# Patient Record
Sex: Male | Born: 1995 | Race: White | Hispanic: No | Marital: Single | Smoking: Current every day smoker
Health system: Southern US, Community
[De-identification: ages and names within clinical notes are randomized; demographics above are authoritative.]

## PROBLEM LIST (undated history)

## (undated) DIAGNOSIS — F1910 Other psychoactive substance abuse, uncomplicated: Secondary | ICD-10-CM

## (undated) DIAGNOSIS — Z720 Tobacco use: Secondary | ICD-10-CM

## (undated) HISTORY — PX: INGUINAL HERNIA REPAIR: SUR1180

---

## 2017-07-03 ENCOUNTER — Inpatient Hospital Stay (HOSPITAL_COMMUNITY)
Admission: EM | Admit: 2017-07-03 | Discharge: 2017-07-09 | DRG: 419 | Disposition: A | Discharge status: Home / Self Care | Payer: BLUE CROSS/BLUE SHIELD | Attending: Family Medicine | Admitting: Family Medicine

## 2017-07-03 ENCOUNTER — Encounter (HOSPITAL_COMMUNITY): Payer: Self-pay

## 2017-07-03 ENCOUNTER — Emergency Department (HOSPITAL_COMMUNITY): Payer: BLUE CROSS/BLUE SHIELD

## 2017-07-03 DIAGNOSIS — K8050 Calculus of bile duct without cholangitis or cholecystitis without obstruction: Secondary | ICD-10-CM | POA: Diagnosis not present

## 2017-07-03 DIAGNOSIS — K810 Acute cholecystitis: Secondary | ICD-10-CM | POA: Diagnosis not present

## 2017-07-03 DIAGNOSIS — Z23 Encounter for immunization: Secondary | ICD-10-CM | POA: Diagnosis not present

## 2017-07-03 DIAGNOSIS — K819 Cholecystitis, unspecified: Secondary | ICD-10-CM | POA: Diagnosis not present

## 2017-07-03 DIAGNOSIS — R1011 Right upper quadrant pain: Secondary | ICD-10-CM | POA: Diagnosis present

## 2017-07-03 DIAGNOSIS — K8511 Biliary acute pancreatitis with uninfected necrosis: Secondary | ICD-10-CM | POA: Diagnosis not present

## 2017-07-03 DIAGNOSIS — K8001 Calculus of gallbladder with acute cholecystitis with obstruction: Secondary | ICD-10-CM | POA: Diagnosis not present

## 2017-07-03 DIAGNOSIS — K838 Other specified diseases of biliary tract: Secondary | ICD-10-CM | POA: Diagnosis not present

## 2017-07-03 DIAGNOSIS — F329 Major depressive disorder, single episode, unspecified: Secondary | ICD-10-CM | POA: Diagnosis present

## 2017-07-03 DIAGNOSIS — F1910 Other psychoactive substance abuse, uncomplicated: Secondary | ICD-10-CM | POA: Diagnosis present

## 2017-07-03 DIAGNOSIS — F1010 Alcohol abuse, uncomplicated: Secondary | ICD-10-CM | POA: Diagnosis present

## 2017-07-03 DIAGNOSIS — R17 Unspecified jaundice: Secondary | ICD-10-CM | POA: Diagnosis not present

## 2017-07-03 DIAGNOSIS — K851 Biliary acute pancreatitis without necrosis or infection: Secondary | ICD-10-CM | POA: Diagnosis not present

## 2017-07-03 DIAGNOSIS — F17200 Nicotine dependence, unspecified, uncomplicated: Secondary | ICD-10-CM | POA: Diagnosis present

## 2017-07-03 DIAGNOSIS — F1510 Other stimulant abuse, uncomplicated: Secondary | ICD-10-CM | POA: Diagnosis present

## 2017-07-03 DIAGNOSIS — K8309 Other cholangitis: Secondary | ICD-10-CM | POA: Diagnosis not present

## 2017-07-03 DIAGNOSIS — K8067 Calculus of gallbladder and bile duct with acute and chronic cholecystitis with obstruction: Secondary | ICD-10-CM | POA: Diagnosis present

## 2017-07-03 DIAGNOSIS — Z79899 Other long term (current) drug therapy: Secondary | ICD-10-CM

## 2017-07-03 DIAGNOSIS — Z720 Tobacco use: Secondary | ICD-10-CM | POA: Diagnosis present

## 2017-07-03 DIAGNOSIS — K8020 Calculus of gallbladder without cholecystitis without obstruction: Secondary | ICD-10-CM | POA: Diagnosis present

## 2017-07-03 DIAGNOSIS — R945 Abnormal results of liver function studies: Secondary | ICD-10-CM | POA: Diagnosis present

## 2017-07-03 DIAGNOSIS — K7689 Other specified diseases of liver: Secondary | ICD-10-CM | POA: Diagnosis not present

## 2017-07-03 DIAGNOSIS — K8043 Calculus of bile duct with acute cholecystitis with obstruction: Secondary | ICD-10-CM | POA: Diagnosis not present

## 2017-07-03 DIAGNOSIS — F32A Depression, unspecified: Secondary | ICD-10-CM | POA: Diagnosis present

## 2017-07-03 DIAGNOSIS — Z419 Encounter for procedure for purposes other than remedying health state, unspecified: Secondary | ICD-10-CM

## 2017-07-03 HISTORY — DX: Tobacco use: Z72.0

## 2017-07-03 HISTORY — DX: Other psychoactive substance abuse, uncomplicated: F19.10

## 2017-07-03 LAB — CBC
HEMATOCRIT: 43.1 % (ref 39.0–52.0)
HEMOGLOBIN: 14 g/dL (ref 13.0–17.0)
MCH: 25.8 pg — ABNORMAL LOW (ref 26.0–34.0)
MCHC: 32.5 g/dL (ref 30.0–36.0)
MCV: 79.5 fL (ref 78.0–100.0)
Platelets: 284 10*3/uL (ref 150–400)
RBC: 5.42 MIL/uL (ref 4.22–5.81)
RDW: 15.5 % (ref 11.5–15.5)
WBC: 6.6 10*3/uL (ref 4.0–10.5)

## 2017-07-03 LAB — COMPREHENSIVE METABOLIC PANEL
ALT: 288 U/L — ABNORMAL HIGH (ref 17–63)
AST: 158 U/L — AB (ref 15–41)
Albumin: 3.8 g/dL (ref 3.5–5.0)
Alkaline Phosphatase: 277 U/L — ABNORMAL HIGH (ref 38–126)
Anion gap: 10 (ref 5–15)
BUN: 7 mg/dL (ref 6–20)
CHLORIDE: 104 mmol/L (ref 101–111)
CO2: 25 mmol/L (ref 22–32)
Calcium: 9.6 mg/dL (ref 8.9–10.3)
Creatinine, Ser: 0.91 mg/dL (ref 0.61–1.24)
Glucose, Bld: 114 mg/dL — ABNORMAL HIGH (ref 65–99)
POTASSIUM: 4.3 mmol/L (ref 3.5–5.1)
Sodium: 139 mmol/L (ref 135–145)
Total Bilirubin: 5.8 mg/dL — ABNORMAL HIGH (ref 0.3–1.2)
Total Protein: 7.6 g/dL (ref 6.5–8.1)

## 2017-07-03 LAB — URINALYSIS, ROUTINE W REFLEX MICROSCOPIC
GLUCOSE, UA: NEGATIVE mg/dL
Hgb urine dipstick: NEGATIVE
Ketones, ur: 5 mg/dL — AB
LEUKOCYTES UA: NEGATIVE
Nitrite: NEGATIVE
PH: 5 (ref 5.0–8.0)
PROTEIN: NEGATIVE mg/dL
Specific Gravity, Urine: 1.025 (ref 1.005–1.030)

## 2017-07-03 LAB — PROTIME-INR
INR: 1
Prothrombin Time: 13.1 seconds (ref 11.4–15.2)

## 2017-07-03 LAB — AMMONIA: AMMONIA: 42 umol/L — AB (ref 9–35)

## 2017-07-03 LAB — LIPASE, BLOOD: LIPASE: 279 U/L — AB (ref 11–51)

## 2017-07-03 MED ORDER — PIPERACILLIN-TAZOBACTAM 3.375 G IVPB 30 MIN
3.3750 g | Freq: Once | INTRAVENOUS | Status: AC
  Administered 2017-07-03: 3.375 g via INTRAVENOUS
  Filled 2017-07-03: qty 50

## 2017-07-03 MED ORDER — IBUPROFEN 400 MG PO TABS: 600.0000 mg | ORAL_TABLET | Freq: Four times a day (QID) | ORAL | Status: DC | PRN

## 2017-07-03 MED ORDER — ONDANSETRON HCL 4 MG PO TABS: 4.0000 mg | ORAL_TABLET | Freq: Four times a day (QID) | ORAL | Status: DC | PRN

## 2017-07-03 MED ORDER — LOPERAMIDE HCL 2 MG PO CAPS: 2.0000 mg | ORAL_CAPSULE | Freq: Four times a day (QID) | ORAL | Status: DC | PRN

## 2017-07-03 MED ORDER — SODIUM CHLORIDE 0.9 % IV SOLN
Freq: Once | INTRAVENOUS | Status: AC
  Administered 2017-07-03: 18:00:00 via INTRAVENOUS

## 2017-07-03 MED ORDER — DULOXETINE HCL 30 MG PO CPEP
30.0000 mg | ORAL_CAPSULE | Freq: Every day | ORAL | Status: DC
  Administered 2017-07-06 – 2017-07-09 (×3): 30 mg via ORAL
  Filled 2017-07-03 (×6): qty 1

## 2017-07-03 MED ORDER — GUAIFENESIN ER 600 MG PO TB12: 600.0000 mg | ORAL_TABLET | Freq: Two times a day (BID) | ORAL | Status: DC | PRN

## 2017-07-03 MED ORDER — SODIUM CHLORIDE 0.9 % IV SOLN
INTRAVENOUS | Status: DC
  Administered 2017-07-03 – 2017-07-04 (×2): via INTRAVENOUS
  Administered 2017-07-05: 125 mL/h via INTRAVENOUS
  Administered 2017-07-05 – 2017-07-08 (×5): via INTRAVENOUS

## 2017-07-03 MED ORDER — SODIUM CHLORIDE 0.9 % IV BOLUS (SEPSIS)
500.0000 mL | Freq: Once | INTRAVENOUS | Status: AC
  Administered 2017-07-03: 500 mL via INTRAVENOUS

## 2017-07-03 MED ORDER — METHOCARBAMOL 500 MG PO TABS
1000.0000 mg | ORAL_TABLET | Freq: Three times a day (TID) | ORAL | Status: DC | PRN
  Administered 2017-07-04 – 2017-07-08 (×11): 1000 mg via ORAL
  Filled 2017-07-03 (×12): qty 2

## 2017-07-03 MED ORDER — KETOROLAC TROMETHAMINE 15 MG/ML IJ SOLN
15.0000 mg | Freq: Three times a day (TID) | INTRAMUSCULAR | Status: DC
  Administered 2017-07-04: 15 mg via INTRAVENOUS
  Filled 2017-07-03: qty 1

## 2017-07-03 MED ORDER — ONDANSETRON HCL 4 MG/2ML IJ SOLN
4.0000 mg | Freq: Four times a day (QID) | INTRAMUSCULAR | Status: DC | PRN
Start: 2017-07-03 — End: 2017-07-09

## 2017-07-03 MED ORDER — IBUPROFEN 400 MG PO TABS
400.0000 mg | ORAL_TABLET | Freq: Four times a day (QID) | ORAL | Status: DC | PRN
Start: 2017-07-03 — End: 2017-07-04

## 2017-07-03 MED ORDER — NICOTINE 21 MG/24HR TD PT24
21.0000 mg | MEDICATED_PATCH | Freq: Every day | TRANSDERMAL | Status: DC
  Administered 2017-07-04 – 2017-07-09 (×6): 21 mg via TRANSDERMAL
  Filled 2017-07-03 (×7): qty 1

## 2017-07-03 MED ORDER — MELATONIN 3 MG PO TABS
3.0000 mg | ORAL_TABLET | Freq: Every evening | ORAL | Status: DC | PRN
  Administered 2017-07-04 – 2017-07-07 (×3): 6 mg via ORAL
  Administered 2017-07-08: 3 mg via ORAL
  Filled 2017-07-03 (×9): qty 2

## 2017-07-03 MED ORDER — LORATADINE 10 MG PO TABS
10.0000 mg | ORAL_TABLET | Freq: Every day | ORAL | Status: DC
  Administered 2017-07-04 – 2017-07-09 (×4): 10 mg via ORAL
  Filled 2017-07-03 (×4): qty 1

## 2017-07-03 MED ORDER — ADULT MULTIVITAMIN W/MINERALS CH
1.0000 | ORAL_TABLET | Freq: Every day | ORAL | Status: DC
Start: 2017-07-04 — End: 2017-07-09
  Administered 2017-07-04 – 2017-07-09 (×4): 1 via ORAL
  Filled 2017-07-03 (×4): qty 1

## 2017-07-03 NOTE — ED Notes (Signed)
ED Provider at bedside. 

## 2017-07-03 NOTE — ED Provider Notes (Signed)
[facility] EMERGENCY DEPARTMENT Provider Note   CSN: [id number] Arrival date & time: 07/03/17  1415     History   Chief Complaint Chief Complaint  Patient presents with  . Abdominal Pain    HPI Nasiah Lehenbauer is a 22 y.o. male.  The history is provided by the patient and medical records. No language interpreter was used.  Abdominal Pain   Associated symptoms include diarrhea. Pertinent negatives include nausea, vomiting and constipation.   Zaidan Keeble is a 22 y.o. male who presents to the Emergency Department complaining of right upper quadrant abdominal pain x 3 days.  Pain is worsening.  It is worse with certain movements.  Associated symptoms include right-sided back pain.  This morning, he was told that he had a yellow coloring and was advised to come to the emergency department.  Patient is from [place] [facility] where he is undergoing alcohol and drug treatment.  He has been there for about 16 days.  He reports drinking alcohol until he blacks out nearly daily, mostly beer.  He also endorses marijuana and meth use, but denies any type of IV drug use.  Denies any fever, chills, lightheadedness, nausea, vomiting, constipation or blood in the stool.  He did have a few loose stools yesterday and today which were a greenish color.  He denies any history of similar.  Past Medical History:  Diagnosis Date  . Drug abuse ([place])   . Tobacco abuse     Patient Active Problem List   Diagnosis Date Noted  . Acute cholecystitis 07/03/2017  . Gallstone 07/03/2017  . Right upper quadrant abdominal pain 07/03/2017  . Tobacco abuse   . Drug abuse ([place])     History reviewed. No pertinent surgical history.     Home Medications    Prior to Admission medications   Medication Sig Start Date End Date Taking? Authorizing Provider  DULoxetine (CYMBALTA) 30 MG capsule Take 30 mg by mouth daily.   Yes [provider]  Melatonin 5 MG TABS Take 5-10 mg by mouth at  bedtime as needed (for insomnia).   Yes [provider]  methocarbamol (ROBAXIN) 500 MG tablet Take 1,000 mg by mouth every 8 (eight) hours as needed for muscle spasms.   Yes [provider]  Multiple Vitamin (MULTIVITAMIN WITH MINERALS) TABS tablet Take 1 tablet by mouth daily.   Yes [provider]    Family History No family history on file.  Social History Social History   Tobacco Use  . Smoking status: Current Every Day Smoker  . Smokeless tobacco: Never Used  Substance Use Topics  . Alcohol use: Yes  . Drug use: Yes    Types: Marijuana, Methamphetamines     Allergies   Mushroom extract complex   Review of Systems Review of Systems  Constitutional: Positive for appetite change (Decreased).  Gastrointestinal: Positive for abdominal pain and diarrhea. Negative for blood in stool, constipation, nausea and vomiting.  Musculoskeletal: Positive for back pain.     Physical Exam Updated Vital Signs BP 126/78   Pulse (!) 57   Temp 98.7 F (37.1 C) (Oral)   Resp (!) 22   SpO2 97%   Physical Exam  Constitutional: He is oriented to person, place, and time. He appears well-developed and well-nourished. No distress.  HENT:  Head: Normocephalic and atraumatic.  Eyes: Scleral icterus is present.  Cardiovascular: Normal rate, regular rhythm and normal heart sounds.  No murmur heard. Pulmonary/Chest: Effort normal and breath sounds  normal. No respiratory distress.  Abdominal: Soft. He exhibits no distension. There is tenderness in the right upper quadrant.  Musculoskeletal: He exhibits no edema.  Neurological: He is alert and oriented to person, place, and time.  Skin: Skin is warm and dry.  Nursing note and vitals reviewed.    ED Treatments / Results  Labs (all labs ordered are listed, but only abnormal results are displayed) Labs Reviewed  LIPASE, BLOOD - Abnormal; Notable for the following components:      Result Value   Lipase 279 (*)     All other components within normal limits  COMPREHENSIVE METABOLIC PANEL - Abnormal; Notable for the following components:   Glucose, Bld 114 (*)    AST 158 (*)    ALT 288 (*)    Alkaline Phosphatase 277 (*)    Total Bilirubin 5.8 (*)    All other components within normal limits  CBC - Abnormal; Notable for the following components:   MCH 25.8 (*)    All other components within normal limits  PROTIME-INR  URINALYSIS, ROUTINE W REFLEX MICROSCOPIC  HEPATITIS PANEL, ACUTE  AMMONIA    EKG  EKG Interpretation None       Radiology US Abdomen Limited Ruq  Result Date: 07/03/2017 CLINICAL DATA:  22 year old male with history of right upper quadrant abdominal pain and jaundice for the past 2 weeks. EXAM: ULTRASOUND ABDOMEN LIMITED RIGHT UPPER QUADRANT COMPARISON:  None. FINDINGS: Gallbladder: Large amount of biliary sludge and multiple small gallstones filling the lumen of the gallbladder. Gallbladder is moderately distended. Gallbladder wall is thickened measuring 7.4 mm. No definite pericholecystic fluid. However, per report, sonographic Murphy sign was positive. Common bile duct: Diameter: 10.4 mm in the porta hepatis. Low level echogenicity (without shadowing) in the common bile duct could indicate the presence of intraductal sludge. Liver: No focal lesion identified. There appears to be some mild intrahepatic biliary ductal dilatation. Within normal limits in parenchymal echogenicity. Portal vein is patent on color Doppler imaging with normal direction of blood flow towards the liver. IMPRESSION: 1. Biliary sludge and cholelithiasis with evidence suggestive of acute cholecystitis, as well as common bile duct dilatation and mild intrahepatic biliary ductal dilatation, concerning for ductal obstruction (potentially related to intraductal sludge). Surgical consultation is recommended. Electronically Signed   By: Vinnie Langton M.D.   On: 07/03/2017 21:11    Procedures Procedures  (including critical care time)  Medications Ordered in ED Medications  piperacillin-tazobactam (ZOSYN) IVPB 3.375 g (3.375 g Intravenous New Bag/Given 07/03/17 2158)  sodium chloride 0.9 % bolus 500 mL (0 mLs Intravenous Stopped 07/03/17 1912)    Followed by  0.9 %  sodium chloride infusion ( Intravenous New Bag/Given 07/03/17 1752)     Initial Impression / Assessment and Plan / ED Course  I have reviewed the triage vital signs and the nursing notes.  Pertinent labs & imaging results that were available during my care of the patient were reviewed by me and considered in my medical decision making (see chart for details).    Tywone Bembenek is a 22 y.o. male who presents to ED for RUQ abdominal pain x 3-4 days. On exam, patient is afebrile, hemodynamically stable with tenderness to RUQ. Jaundiced on exam. Labs reviewed with lipase of 279, bili 5.8, AST/ALT 158/288 and alk phos of 277. Hepatitis panel obtained. PT/INR wdl. RUQ obtained showing biliary sludge and cholelithiasis as well as dilation of common bile duct and intrahepatic bile duct.   9:33 PM - Discussed case  with general surgery, Dr. Donne Hazel, who recommends hospitalist admission, GI consultation and general surgery will follow.   [facility] consulted who will see patient in the am. Continue IV hydration. Likely ERCP in the next day or two.   Hospitalist consulted who will admit.   Patient discussed with Dr. Jeneen Rinks who agrees with treatment plan.    Final Clinical Impressions(s) / ED Diagnoses   Final diagnoses:  RUQ pain    ED Discharge Orders    None       Ormond Lazo, Ozella Almond, PA-C 07/03/17 2225    Tanna Furry, MD 07/03/17 2250

## 2017-07-03 NOTE — ED Notes (Signed)
Patient transported to Ultrasound 

## 2017-07-03 NOTE — ED Notes (Signed)
Daneil DolinCaroline Bushee Mother ok to come back when arrives to ED.

## 2017-07-03 NOTE — ED Notes (Signed)
Urine sample requested.

## 2017-07-03 NOTE — H&P (Signed)
History and Physical    Alex Potter ZOX:[id number] DOB: 1996/04/12 DOA: 07/03/2017  Referring MD/NP/PA:   PCP: Patient, No Pcp Per   Patient coming from:  The patient is coming from home.  At baseline, pt is independent for most of ADL.   Chief Complaint: Right upper quadrant abdominal pain and jaundice  HPI: Alex Potter is a 22 y.o. male with medical history significant of drug abuse, tobacco abuse, alcohol abuse, depression, who presents with right upper quadrant abdominal pain and jaundice.  Patient states that he has been having right upper quadrant abdominal pain the past 3 days. He is constant, 6 out of 10 in severity, radiating to the right lower back, sharp. No fever or chills. Patient denies nausea, vomiting, diarrhea or abdominal pain. Patient was noted to have yellow skin and eyes. Patient does not have chest pain, shortness breath, cough. Of note, patient is in [facility] where he is undergoing alcohol and drug treatment.  He has been there for about 16 days.  He reports drinking alcohol nearly daily, mostly beer.  He also endorses marijuana and meth use, but denies any type of IV drug use.   ED Course: pt was found to have WBC 6.6, INR 1.0, lipase 279, electrolytes renal function okay, abnormal liver functions with ALP 277, AST 158, ALT 288, total bilirubin 5.8. Temperature normal, bradycardia, oxygen saturation 97% on room air. Patient is admitted to MedSurg bed as inpatient. Gen. surgeon, Dr. Dwain Sarna and [facility] were consulted.  # US-RUQ showed: biliary sludge and cholelithiasis with evidence suggestive of acute cholecystitis, as well as common bile duct dilatation and mild intrahepatic biliary ductal dilatation, concerning for ductal obstruction (potentially related to intraductal sludge).   Review of Systems:   General: no fevers, chills, no body weight gain, has poor appetite, has fatigue HEENT: no blurry vision, hearing changes or sore throat Respiratory: no  dyspnea, coughing, wheezing CV: no chest pain, no palpitations GI: no nausea, vomiting, has abdominal pain, no diarrhea, constipation GU: no dysuria, burning on urination, increased urinary frequency, hematuria  Ext: no leg edema Neuro: no unilateral weakness, numbness, or tingling, no vision change or hearing loss Skin: no rash, no skin tear. MSK: No muscle spasm, no deformity, no limitation of range of movement in spin Heme: No easy bruising.  Travel history: No recent long distant travel.  Allergy:  Allergies  Allergen Reactions  . Mushroom Extract Complex Itching and Swelling    Past Medical History:  Diagnosis Date  . Drug abuse ([facility])   . Tobacco abuse     Past Surgical History:  Procedure Laterality Date  . INGUINAL HERNIA REPAIR Right     Social History:  reports that he has been smoking.  he has never used smokeless tobacco. He reports that he drinks alcohol. He reports that he uses drugs. Drugs: Marijuana and Methamphetamines.  Family History: Patient is adopted.   Prior to Admission medications   Medication Sig Start Date End Date Taking? Authorizing Provider  Methocarbamol (ROBAXIN PO) Take 2 tablets by mouth 3 (three) times daily.   Yes [provider]    Physical Exam: Vitals:   07/03/17 2315 07/03/17 2330 07/03/17 2345 07/04/17 0000  BP: 102/88 104/69 (!) 106/53 119/83  Pulse: 73 62 (!) 101 91  Resp: (!) 23 (!) 21 19 (!) 24  Temp:      TempSrc:      SpO2: 97% 97% 99% 96%   General: Not in acute distress HEENT:  Eyes: PERRL, EOMI, has scleral icterus.       ENT: No discharge from the ears and nose, no pharynx injection, no tonsillar enlargement.        Neck: No JVD, no bruit, no mass felt. Heme: No neck lymph node enlargement. Cardiac: S1/S2, RRR, No murmurs, No gallops or rubs. Respiratory: No rales, wheezing, rhonchi or rubs. GI: Soft, nondistended, has tenderness in RUQ, no rebound pain, no organomegaly, BS present. GU: No  hematuria Ext: No pitting leg edema bilaterally. 2+DP/PT pulse bilaterally. Musculoskeletal: No joint deformities, No joint redness or warmth, no limitation of ROM in spin. Skin: No rashes.  Neuro: Alert, oriented X3, cranial nerves II-XII grossly intact, moves all extremities normally.  Psych: Patient is not psychotic, no suicidal or hemocidal ideation.  Labs on Admission: I have personally reviewed following labs and imaging studies  CBC: Recent Labs  Lab 07/03/17 1500  WBC 6.6  HGB 14.0  HCT 43.1  MCV 79.5  PLT 284   Basic Metabolic Panel: Recent Labs  Lab 07/03/17 1500  NA 139  K 4.3  CL 104  CO2 25  GLUCOSE 114*  BUN 7  CREATININE 0.91  CALCIUM 9.6   GFR: CrCl cannot be calculated (Unknown ideal weight.). Liver Function Tests: Recent Labs  Lab 07/03/17 1500  AST 158*  ALT 288*  ALKPHOS 277*  BILITOT 5.8*  PROT 7.6  ALBUMIN 3.8   Recent Labs  Lab 07/03/17 1500  LIPASE 279*   Recent Labs  Lab 07/03/17 2149  AMMONIA 42*   Coagulation Profile: Recent Labs  Lab 07/03/17 1447  INR 1.00   Cardiac Enzymes: No results for input(s): CKTOTAL, CKMB, CKMBINDEX, TROPONINI in the last 168 hours. BNP (last 3 results) No results for input(s): PROBNP in the last 8760 hours. HbA1C: No results for input(s): HGBA1C in the last 72 hours. CBG: No results for input(s): GLUCAP in the last 168 hours. Lipid Profile: No results for input(s): CHOL, HDL, LDLCALC, TRIG, CHOLHDL, LDLDIRECT in the last 72 hours. Thyroid Function Tests: No results for input(s): TSH, T4TOTAL, FREET4, T3FREE, THYROIDAB in the last 72 hours. Anemia Panel: No results for input(s): VITAMINB12, FOLATE, FERRITIN, TIBC, IRON, RETICCTPCT in the last 72 hours. Urine analysis:    Component Value Date/Time   COLORURINE AMBER (A) 07/03/2017 2119   APPEARANCEUR CLEAR 07/03/2017 2119   LABSPEC 1.025 07/03/2017 2119   PHURINE 5.0 07/03/2017 2119   GLUCOSEU NEGATIVE 07/03/2017 2119   HGBUR  NEGATIVE 07/03/2017 2119   BILIRUBINUR MODERATE (A) 07/03/2017 2119   KETONESUR 5 (A) 07/03/2017 2119   PROTEINUR NEGATIVE 07/03/2017 2119   NITRITE NEGATIVE 07/03/2017 2119   LEUKOCYTESUR NEGATIVE 07/03/2017 2119   Sepsis Labs: @LABRCNTIP (procalcitonin:4,lacticidven:4) )No results found for this or any previous visit (from the past 240 hour(s)).   Radiological Exams on Admission: US Abdomen Limited Ruq  Result Date: 07/03/2017 CLINICAL DATA:  22 year old male with history of right upper quadrant abdominal pain and jaundice for the past 2 weeks. EXAM: ULTRASOUND ABDOMEN LIMITED RIGHT UPPER QUADRANT COMPARISON:  None. FINDINGS: Gallbladder: Large amount of biliary sludge and multiple small gallstones filling the lumen of the gallbladder. Gallbladder is moderately distended. Gallbladder wall is thickened measuring 7.4 mm. No definite pericholecystic fluid. However, per report, sonographic Murphy sign was positive. Common bile duct: Diameter: 10.4 mm in the porta hepatis. Low level echogenicity (without shadowing) in the common bile duct could indicate the presence of intraductal sludge. Liver: No focal lesion identified. There appears to be some mild intrahepatic  biliary ductal dilatation. Within normal limits in parenchymal echogenicity. Portal vein is patent on color Doppler imaging with normal direction of blood flow towards the liver. IMPRESSION: 1. Biliary sludge and cholelithiasis with evidence suggestive of acute cholecystitis, as well as common bile duct dilatation and mild intrahepatic biliary ductal dilatation, concerning for ductal obstruction (potentially related to intraductal sludge). Surgical consultation is recommended. Electronically Signed   By: Trudie Reedaniel  Entrikin M.D.   On: 07/03/2017 21:11     EKG:  Not done in ED.  Assessment/Plan Principal Problem:   Right upper quadrant abdominal pain Active Problems:   Acute cholecystitis   Gallstone   Tobacco abuse   Drug abuse ([facility])    Cholecystitis   Abnormal liver function   Depression   Right upper quadrant abdominal pain due to acute cholecystitis and cholecystitis: US-RUQ showed biliary sludge and cholelithiasis with evidence suggestive of acute cholecystitis, as well as common bile duct dilatation and mild intrahepatic biliary ductal dilatation. Patient is not septic, hemodynamically stable. No fever or leukocytosis. Gen. surgeon, Dr. Dwain SarnaWakefield, and [facility] (EDP did not write down GI doctor's name).  -will admit to MedSurg bed as inpatient -When necessary Toradol for moderate pain -Continue home ibuprofen for mild pain -IVF: 1.5 L normal saline, followed by 125 mL per hour -IV zosyn and blood culture  Polysubstance abuse, including tobacco, alcohol and meth use: [facility].  He has been there for about 16 days.  -Did counseling about importance of quitting smoking and drinking -Nicotine -check UDS  Abnormal liver function common: Most likely due to gallstone and tobacco abuse -Check hepatitis panel and HIV antibody -avoid using liver toxic medications, such as Tylenol  Depression: Stable, no suicidal or homicidal ideations. -Continue home medications: Cymbalta   DVT ppx: SCD Code Status: Full code Family Communication:  Yes, patient's mother  at bed side Disposition Plan:  Anticipate discharge back to previous home environment Consults called:  Gen. surgeon, Dr. Dwain SarnaWakefield, and [facility] Admission status:  medical floor/inpt Date of Service 07/04/2017    Lorretta HarpXilin Starlynn Klinkner [facility] Pager [phone]  If 7PM-7AM, please contact night-coverage [email or web address] Password [email or web address] 07/04/2017, 1:09 AM

## 2017-07-03 NOTE — ED Triage Notes (Signed)
[facility] States that he has had 3 days of RUQ pain that is worse with movement and decreased appetite. No nausea, no vomiting. Pale with yellowish tent to skin. alert and oriented

## 2017-07-04 ENCOUNTER — Inpatient Hospital Stay (HOSPITAL_COMMUNITY): Payer: BLUE CROSS/BLUE SHIELD | Admitting: Anesthesiology

## 2017-07-04 ENCOUNTER — Other Ambulatory Visit: Payer: Self-pay

## 2017-07-04 ENCOUNTER — Encounter (HOSPITAL_COMMUNITY): Payer: Self-pay | Admitting: Internal Medicine

## 2017-07-04 ENCOUNTER — Inpatient Hospital Stay (HOSPITAL_COMMUNITY): Payer: BLUE CROSS/BLUE SHIELD

## 2017-07-04 ENCOUNTER — Encounter (HOSPITAL_COMMUNITY)
Admission: EM | Disposition: A | Discharge status: Home / Self Care | Payer: Self-pay | Source: Home / Self Care | Attending: Family Medicine

## 2017-07-04 DIAGNOSIS — R945 Abnormal results of liver function studies: Secondary | ICD-10-CM | POA: Diagnosis present

## 2017-07-04 DIAGNOSIS — K8050 Calculus of bile duct without cholangitis or cholecystitis without obstruction: Secondary | ICD-10-CM

## 2017-07-04 DIAGNOSIS — F32A Depression, unspecified: Secondary | ICD-10-CM | POA: Diagnosis present

## 2017-07-04 DIAGNOSIS — K851 Biliary acute pancreatitis without necrosis or infection: Secondary | ICD-10-CM

## 2017-07-04 DIAGNOSIS — R1011 Right upper quadrant pain: Secondary | ICD-10-CM

## 2017-07-04 DIAGNOSIS — R17 Unspecified jaundice: Secondary | ICD-10-CM

## 2017-07-04 DIAGNOSIS — F329 Major depressive disorder, single episode, unspecified: Secondary | ICD-10-CM | POA: Diagnosis present

## 2017-07-04 DIAGNOSIS — K838 Other specified diseases of biliary tract: Secondary | ICD-10-CM

## 2017-07-04 HISTORY — PX: ERCP: SHX5425

## 2017-07-04 LAB — COMPREHENSIVE METABOLIC PANEL
ALK PHOS: 260 U/L — AB (ref 38–126)
ALT: 239 U/L — ABNORMAL HIGH (ref 17–63)
ANION GAP: 10 (ref 5–15)
AST: 133 U/L — ABNORMAL HIGH (ref 15–41)
Albumin: 3.1 g/dL — ABNORMAL LOW (ref 3.5–5.0)
BUN: 7 mg/dL (ref 6–20)
CALCIUM: 8.8 mg/dL — AB (ref 8.9–10.3)
CHLORIDE: 106 mmol/L (ref 101–111)
CO2: 22 mmol/L (ref 22–32)
CREATININE: 1 mg/dL (ref 0.61–1.24)
Glucose, Bld: 91 mg/dL (ref 65–99)
Potassium: 3.8 mmol/L (ref 3.5–5.1)
SODIUM: 138 mmol/L (ref 135–145)
Total Bilirubin: 5.2 mg/dL — ABNORMAL HIGH (ref 0.3–1.2)
Total Protein: 6.2 g/dL — ABNORMAL LOW (ref 6.5–8.1)

## 2017-07-04 LAB — RAPID URINE DRUG SCREEN, HOSP PERFORMED
Amphetamines: NOT DETECTED
BARBITURATES: NOT DETECTED
BENZODIAZEPINES: NOT DETECTED
Cocaine: NOT DETECTED
Opiates: NOT DETECTED
TETRAHYDROCANNABINOL: POSITIVE — AB

## 2017-07-04 LAB — TYPE AND SCREEN
ABO/RH(D): O POS
Antibody Screen: NEGATIVE

## 2017-07-04 LAB — HEPATITIS PANEL, ACUTE
HEP A IGM: NEGATIVE
Hep B C IgM: NEGATIVE
Hepatitis B Surface Ag: NEGATIVE

## 2017-07-04 LAB — HIV ANTIBODY (ROUTINE TESTING W REFLEX): HIV Screen 4th Generation wRfx: NONREACTIVE

## 2017-07-04 LAB — MRSA PCR SCREENING: MRSA by PCR: NEGATIVE

## 2017-07-04 LAB — LIPASE, BLOOD: Lipase: 163 U/L — ABNORMAL HIGH (ref 11–51)

## 2017-07-04 LAB — GLUCOSE, CAPILLARY: Glucose-Capillary: 76 mg/dL (ref 65–99)

## 2017-07-04 LAB — APTT: APTT: 33 s (ref 24–36)

## 2017-07-04 LAB — ABO/RH: ABO/RH(D): O POS

## 2017-07-04 SURGERY — ERCP, WITH INTERVENTION IF INDICATED
Anesthesia: General

## 2017-07-04 MED ORDER — PNEUMOCOCCAL VAC POLYVALENT 25 MCG/0.5ML IJ INJ
0.5000 mL | INJECTION | INTRAMUSCULAR | Status: AC
  Administered 2017-07-06: 0.5 mL via INTRAMUSCULAR
  Filled 2017-07-04: qty 0.5

## 2017-07-04 MED ORDER — IOPAMIDOL (ISOVUE-300) INJECTION 61%
INTRAVENOUS | Status: AC
  Filled 2017-07-04: qty 100

## 2017-07-04 MED ORDER — IBUPROFEN 400 MG PO TABS
400.0000 mg | ORAL_TABLET | Freq: Four times a day (QID) | ORAL | Status: DC | PRN
  Administered 2017-07-04 (×2): 400 mg via ORAL
  Filled 2017-07-04: qty 2
  Filled 2017-07-04: qty 1

## 2017-07-04 MED ORDER — GLUCAGON HCL RDNA (DIAGNOSTIC) 1 MG IJ SOLR
INTRAMUSCULAR | Status: DC | PRN
  Administered 2017-07-04 (×2): .5 mg via INTRAVENOUS

## 2017-07-04 MED ORDER — INFLUENZA VAC SPLIT QUAD 0.5 ML IM SUSY
0.5000 mL | PREFILLED_SYRINGE | INTRAMUSCULAR | Status: AC
  Administered 2017-07-06: 0.5 mL via INTRAMUSCULAR
  Filled 2017-07-04: qty 0.5

## 2017-07-04 MED ORDER — DEXAMETHASONE SODIUM PHOSPHATE 10 MG/ML IJ SOLN
INTRAMUSCULAR | Status: DC | PRN
  Administered 2017-07-04: 10 mg via INTRAVENOUS

## 2017-07-04 MED ORDER — ONDANSETRON HCL 4 MG/2ML IJ SOLN
INTRAMUSCULAR | Status: DC | PRN
  Administered 2017-07-04: 4 mg via INTRAVENOUS

## 2017-07-04 MED ORDER — ROCURONIUM BROMIDE 100 MG/10ML IV SOLN
INTRAVENOUS | Status: DC | PRN
  Administered 2017-07-04: 50 mg via INTRAVENOUS

## 2017-07-04 MED ORDER — GLYCOPYRROLATE 0.2 MG/ML IJ SOLN
INTRAMUSCULAR | Status: DC | PRN
  Administered 2017-07-04: 0.3 mg via INTRAVENOUS

## 2017-07-04 MED ORDER — PROPOFOL 10 MG/ML IV BOLUS
INTRAVENOUS | Status: DC | PRN
  Administered 2017-07-04: 200 mg via INTRAVENOUS

## 2017-07-04 MED ORDER — CHLORHEXIDINE GLUCONATE CLOTH 2 % EX PADS: 6.0000 | MEDICATED_PAD | Freq: Once | CUTANEOUS | Status: DC

## 2017-07-04 MED ORDER — INDOMETHACIN 50 MG RE SUPP
RECTAL | Status: AC
  Filled 2017-07-04: qty 2

## 2017-07-04 MED ORDER — PIPERACILLIN-TAZOBACTAM 3.375 G IVPB
3.3750 g | Freq: Three times a day (TID) | INTRAVENOUS | Status: DC
  Administered 2017-07-04 – 2017-07-09 (×16): 3.375 g via INTRAVENOUS
  Filled 2017-07-04 (×17): qty 50

## 2017-07-04 MED ORDER — INDOMETHACIN 50 MG RE SUPP
RECTAL | Status: DC | PRN
  Administered 2017-07-04: 100 mg via RECTAL

## 2017-07-04 MED ORDER — GLUCAGON HCL RDNA (DIAGNOSTIC) 1 MG IJ SOLR
INTRAMUSCULAR | Status: AC
  Filled 2017-07-04: qty 1

## 2017-07-04 MED ORDER — NEOSTIGMINE METHYLSULFATE 10 MG/10ML IV SOLN
INTRAVENOUS | Status: DC | PRN
  Administered 2017-07-04: 3 mg via INTRAVENOUS

## 2017-07-04 MED ORDER — LACTATED RINGERS IV SOLN
INTRAVENOUS | Status: DC | PRN
  Administered 2017-07-04: 13:00:00 via INTRAVENOUS

## 2017-07-04 MED ORDER — KETOROLAC TROMETHAMINE 15 MG/ML IJ SOLN
15.0000 mg | Freq: Three times a day (TID) | INTRAMUSCULAR | Status: AC | PRN
  Administered 2017-07-06 – 2017-07-07 (×2): 15 mg via INTRAVENOUS
  Filled 2017-07-04 (×3): qty 1

## 2017-07-04 MED ORDER — IOPAMIDOL (ISOVUE-370) INJECTION 76%
INTRAVENOUS | Status: DC | PRN
  Administered 2017-07-04: 50 mL via INTRAVENOUS

## 2017-07-04 MED ORDER — LIDOCAINE HCL (CARDIAC) 20 MG/ML IV SOLN
INTRAVENOUS | Status: DC | PRN
  Administered 2017-07-04: 100 mg via INTRAVENOUS

## 2017-07-04 NOTE — Interval H&P Note (Signed)
History and Physical Interval Note:  07/04/2017 1:02 PM  Alex Potter  has presented today for surgery, with the diagnosis of choledocholithiasis  The various methods of treatment have been discussed with the patient and family. After consideration of risks, benefits and other options for treatment, the patient has consented to  Procedure(s): ENDOSCOPIC RETROGRADE CHOLANGIOPANCREATOGRAPHY (ERCP) (N/A) as a surgical intervention .  The patient's history has been reviewed, patient examined, no change in status, stable for surgery.  I have reviewed the patient's chart and labs.  Questions were answered to the patient's satisfaction.     Charlie PitterHenry L Danis III

## 2017-07-04 NOTE — Consult Note (Addendum)
 Referring Provider: [facility]   Primary Care Physician:  Patient, No Pcp Per Primary Gastroenterologist:   unassigned  Reason for Consultation: abnormal liver labs and u/s  ASSESSMENT AND PLAN:    1.  22-year-old male with probable choledocholelithiasis and mild gallstone pancreatitis. He presented with progressive RUQ pain over the last couple of weeks. Lipase 279 yesterday, down to 163 today. U/S remarkable for CBD duct dilation. Some improvement in liver labs overnight. WBC normal.  -Patient will need ERCP with stone extraction and probable sphincterotomy.  Will need anesthesia support.  We will plan for this procedure to be done sometime after lunch today.  The risk and benefits of procedure were discussed with patient and his mother in the room.  Patient agrees to proceed.  2. Drug abuse, currently in Rehab   HPI: Maxon Addis is a 22 y.o. male currently in rehab, admitted last night with nonradiating RUQ pain and jaundice. U/S remarkable with cholelithiasis and CBD dilation.  Some improvement in liver labs overnight. Pain started a few weeks ago.  He describes it as constant discomfort.  Over the last few days pain became unbearable. No associated vomiting.  Mother in room and helps with history.  Patient has been eating very little lately because of the pain.  No fevers or chills.  Patient has no chronic GI complaints.  Bowel movements are regular.Mild improvement in transaminases and cholestasis overnight. Tmax 99.9, normal WBC  Past Medical History:  Diagnosis Date  . Drug abuse (HCC)   . Tobacco abuse     Past Surgical History:  Procedure Laterality Date  . INGUINAL HERNIA REPAIR Right     Prior to Admission medications   Medication Sig Start Date End Date Taking? Authorizing Provider  alum & mag hydroxide-simeth (MAALOX/MYLANTA) 200-200-20 MG/5ML suspension Take 15-30 mLs by mouth as needed for indigestion or heartburn.   Yes [provider]  Benzocaine  15 MG LOZG Use as directed 1 each in the mouth or throat every 4 (four) hours as needed (for sore throat).   Yes [provider]  benzonatate (TESSALON) 100 MG capsule Take 200 mg by mouth 3 (three) times daily as needed for cough.   Yes [provider]  cetirizine (ZYRTEC) 10 MG tablet Take 10 mg by mouth daily as needed for allergies.   Yes [provider]  DULoxetine (CYMBALTA) 30 MG capsule Take 30 mg by mouth daily.   Yes [provider]  Fructose-Dextrose-Phosphor Acd (NAUSEA CONTROL) 1.87-1.87-21.5 SOLN Take 5-10 mLs by mouth every 15 (fifteen) minutes as needed (for nausea).   Yes [provider]  guaiFENesin (MUCINEX) 600 MG 12 hr tablet Take 600 mg by mouth 2 (two) times daily as needed for cough or to loosen phlegm.   Yes [provider]  ibuprofen (ADVIL,MOTRIN) 600 MG tablet Take 600 mg by mouth every 6 (six) hours as needed for fever or mild pain.   Yes [provider]  loperamide (IMODIUM A-D) 2 MG tablet Take 2 mg by mouth 4 (four) times daily as needed for diarrhea or loose stools.   Yes [provider]  Melatonin 5 MG TABS Take 5-10 mg by mouth at bedtime as needed (for insomnia).   Yes [provider]  methocarbamol (ROBAXIN) 500 MG tablet Take 1,000 mg by mouth every 8 (eight) hours as needed for muscle spasms.   Yes [provider]  Multiple Vitamin (MULTIVITAMIN WITH MINERALS) TABS tablet Take 1 tablet by mouth daily.     Yes [provider]  ondansetron (ZOFRAN) 8 MG tablet Take 8 mg by mouth 2 (two) times daily as needed for nausea or vomiting.   Yes [provider]    Current Facility-Administered Medications  Medication Dose Route Frequency Provider Last Rate Last Dose  . 0.9 %  sodium chloride infusion   Intravenous Continuous Niu, Xilin, MD 125 mL/hr at 07/03/17 2330    . DULoxetine (CYMBALTA) DR capsule 30 mg  30 mg Oral Daily Niu, Xilin, MD      . guaiFENesin  (MUCINEX) 12 hr tablet 600 mg  600 mg Oral BID PRN Niu, Xilin, MD      . ibuprofen (ADVIL,MOTRIN) tablet 400 mg  400 mg Oral Q6H PRN Niu, Xilin, MD   400 mg at 07/04/17 0411  . [START ON 07/05/2017] Influenza vac split quadrivalent PF (FLUARIX) injection 0.5 mL  0.5 mL Intramuscular Tomorrow-1000 Niu, Xilin, MD      . ketorolac (TORADOL) 15 MG/ML injection 15 mg  15 mg Intravenous Q8H PRN Niu, Xilin, MD      . loperamide (IMODIUM) capsule 2 mg  2 mg Oral QID PRN Niu, Xilin, MD      . loratadine (CLARITIN) tablet 10 mg  10 mg Oral Daily Niu, Xilin, MD      . Melatonin TABS 3-6 mg  3-6 mg Oral QHS PRN Niu, Xilin, MD      . methocarbamol (ROBAXIN) tablet 1,000 mg  1,000 mg Oral Q8H PRN Niu, Xilin, MD   1,000 mg at 07/04/17 0411  . multivitamin with minerals tablet 1 tablet  1 tablet Oral Daily Niu, Xilin, MD      . nicotine (NICODERM CQ - dosed in mg/24 hours) patch 21 mg  21 mg Transdermal Daily Niu, Xilin, MD      . ondansetron (ZOFRAN) tablet 4 mg  4 mg Oral Q6H PRN Niu, Xilin, MD       Or  . ondansetron (ZOFRAN) injection 4 mg  4 mg Intravenous Q6H PRN Niu, Xilin, MD      . piperacillin-tazobactam (ZOSYN) IVPB 3.375 g  3.375 g Intravenous Q8H Niu, Xilin, MD   Stopped at 07/04/17 0853  . [START ON 07/05/2017] pneumococcal 23 valent vaccine (PNU-IMMUNE) injection 0.5 mL  0.5 mL Intramuscular Tomorrow-1000 Niu, Xilin, MD        Allergies as of 07/03/2017 - Review Complete 07/03/2017  Allergen Reaction Noted  . Mushroom extract complex Itching and Swelling 07/03/2017    No family history on file. Patient adopted, family history not known  Social History   Socioeconomic History  . Marital status: Single    Spouse name: Not on file  . Number of children: Not on file  . Years of education: Not on file  . Highest education level: Not on file  Social Needs  . Financial resource strain: Not on file  . Food insecurity - worry: Not on file  . Food insecurity - inability: Not on file  .  Transportation needs - medical: Not on file  . Transportation needs - non-medical: Not on file  Occupational History  . Not on file  Tobacco Use  . Smoking status: Current Every Day Smoker  . Smokeless tobacco: Never Used  Substance and Sexual Activity  . Alcohol use: Yes  . Drug use: Yes    Types: Marijuana, Methamphetamines  . Sexual activity: Not on file  Other Topics Concern  . Not on file  Social History Narrative  . Not on file      Review of Systems: All systems reviewed and negative except where noted in HPI.  Physical Exam: Vital signs in last 24 hours: Temp:  [97.6 F (36.4 C)-99.9 F (37.7 C)] 97.6 F (36.4 C) (02/21 0424) Pulse Rate:  [54-101] 55 (02/21 0424) Resp:  [17-24] 18 (02/21 0424) BP: (102-128)/(53-88) 123/69 (02/21 0424) SpO2:  [96 %-100 %] 97 % (02/21 0424) Weight:  [163 lb 2.3 oz (74 kg)] 163 lb 2.3 oz (74 kg) (02/21 0130) Last BM Date: 07/04/17 General:   Alert, well-developed,  white male in NAD Psych:  Pleasant, cooperative. Normal mood and affect. Eyes:  Pupils equal, sclera clear, no icterus.   Conjunctiva pink. Ears:  Normal auditory acuity. Nose:  No deformity, discharge,  or lesions. Neck:  Supple; no masses Lungs:  Clear throughout to auscultation.   No wheezes, crackles, or rhonchi.  Heart:  Regular rate and rhythm, no edema Abdomen:  Soft, non-distended, nontender, BS active, no palp mass    Rectal:  Deferred  Msk:  Symmetrical without gross deformities. . Neurologic:  Alert and  oriented x4;  grossly normal neurologically. Skin:  Intact without significant lesions or rashes..   Intake/Output from previous day: 02/20 0701 - 02/21 0700 In: 2191.7 [I.V.:1591.7; IV Piggyback:600] Out: 200 [Urine:200] Intake/Output this shift: No intake/output data recorded.  Lab Results: Recent Labs    07/03/17 1500  WBC 6.6  HGB 14.0  HCT 43.1  PLT 284   BMET Recent Labs    07/03/17 1500 07/04/17 0621  NA 139 138  K 4.3 3.8  CL 104  106  CO2 25 22  GLUCOSE 114* 91  BUN 7 7  CREATININE 0.91 1.00  CALCIUM 9.6 8.8*   LFT Recent Labs    07/04/17 0621  PROT 6.2*  ALBUMIN 3.1*  AST 133*  ALT 239*  ALKPHOS 260*  BILITOT 5.2*   PT/INR Recent Labs    07/03/17 1447  LABPROT 13.1  INR 1.00   Hepatitis Panel Recent Labs    07/03/17 1638  HEPBSAG Negative  HCVAB <0.1  HEPAIGM Negative  HEPBIGM Negative    Studies/Results: Us Abdomen Limited Ruq  Result Date: 07/03/2017 CLINICAL DATA:  22-year-old male with history of right upper quadrant abdominal pain and jaundice for the past 2 weeks. EXAM: ULTRASOUND ABDOMEN LIMITED RIGHT UPPER QUADRANT COMPARISON:  None. FINDINGS: Gallbladder: Large amount of biliary sludge and multiple small gallstones filling the lumen of the gallbladder. Gallbladder is moderately distended. Gallbladder wall is thickened measuring 7.4 mm. No definite pericholecystic fluid. However, per report, sonographic Murphy sign was positive. Common bile duct: Diameter: 10.4 mm in the porta hepatis. Low level echogenicity (without shadowing) in the common bile duct could indicate the presence of intraductal sludge. Liver: No focal lesion identified. There appears to be some mild intrahepatic biliary ductal dilatation. Within normal limits in parenchymal echogenicity. Portal vein is patent on color Doppler imaging with normal direction of blood flow towards the liver. IMPRESSION: 1. Biliary sludge and cholelithiasis with evidence suggestive of acute cholecystitis, as well as common bile duct dilatation and mild intrahepatic biliary ductal dilatation, concerning for ductal obstruction (potentially related to intraductal sludge). Surgical consultation is recommended. Electronically Signed   By: Daniel  Entrikin M.D.   On: 07/03/2017 21:11     Paula Guenther, NP-C @  07/04/2017, 9:17 AM  Pager number [phone]  I have reviewed the entire case in detail with the above APP and discussed the plan in  detail.  Therefore, I agree with the diagnoses recorded   above. In addition,  I have personally interviewed and examined the patient and have personally reviewed any abdominal/pelvic CT scan images.  My additional thoughts are as follows:  RUQ pain Choledocholithiasis without cholangitis. Jaundice Mild biliary pancreatitis    ERCP today. Lap chole tomorrow Saw him with his mother in preop.  Procedure described in detail and he is agreeable.  The benefits and risks of the planned procedure were described in detail with the patient or (when appropriate) their health care proxy.  Risks were outlined as including, but not limited to, bleeding, infection, perforation, adverse medication reaction leading to cardiac or pulmonary decompensation, or pancreatitis (if ERCP).  The limitation of incomplete mucosal visualization was also discussed.  No guarantees or warranties were given.  Loukas Antonson L Danis III Pager [phone]  Mon-Fri 8a-5p [phone] after 5p, weekends, holidays    

## 2017-07-04 NOTE — Anesthesia Procedure Notes (Signed)
Procedure Name: Intubation Date/Time: 07/04/2017 1:04 PM Performed by: Laretta Alstrom, CRNA Pre-anesthesia Checklist: Timeout performed, Patient being monitored, Suction available, Emergency Drugs available and Patient identified Patient Re-evaluated:Patient Re-evaluated prior to induction Oxygen Delivery Method: Circle system utilized Preoxygenation: Pre-oxygenation with 100% oxygen Induction Type: IV induction Ventilation: Mask ventilation without difficulty Laryngoscope Size: Mac and 3 Grade View: Grade I Tube type: Oral Tube size: 7.5 mm Number of attempts: 1 Placement Confirmation: breath sounds checked- equal and bilateral,  positive ETCO2 and ETT inserted through vocal cords under direct vision Secured at: 23 cm Tube secured with: Tape Dental Injury: Teeth and Oropharynx as per pre-operative assessment

## 2017-07-04 NOTE — Transfer of Care (Signed)
Immediate Anesthesia Transfer of Care Note  Patient: Alex Potter  Procedure(s) Performed: ENDOSCOPIC RETROGRADE CHOLANGIOPANCREATOGRAPHY (ERCP) (N/A )  Patient Location: Endoscopy Unit  Anesthesia Type:General  Level of Consciousness: awake, alert  and oriented  Airway & Oxygen Therapy: Patient Spontanous Breathing and Patient connected to nasal cannula oxygen  Post-op Assessment: Report given to RN, Post -op Vital signs reviewed and stable and Patient moving all extremities X 4  Post vital signs: Reviewed and stable  Last Vitals:  Vitals:   07/04/17 0424 07/04/17 1220  BP: 123/69 130/67  Pulse: (!) 55 70  Resp: 18 17  Temp: 36.4 C 36.9 C  SpO2: 97% 99%    Last Pain:  Vitals:   07/04/17 1220  TempSrc: Oral  PainSc: 6       Patients Stated Pain Goal: 2 (07/04/17 1000)  Complications: No apparent anesthesia complications

## 2017-07-04 NOTE — Progress Notes (Signed)
LFT's have not significantly changed this AM.  Gastroenterology has not seen the patient yet.  Patient needs ERCP prior to lap chole.  Marta LamasJames O. Gae BonWyatt, III, MD, FACS [phone]--pager [phone]--office [facility]

## 2017-07-04 NOTE — Progress Notes (Signed)
Received pt from ED, alert and oriented. Ambulated to bed. Denies pain at this time. Pt's mother at bedside. Oriented pt with room and call light use. Will monitor pt.

## 2017-07-04 NOTE — Progress Notes (Addendum)
Just finished a long conversation with the patient, his father died speakerphone, his mother, and separately I spoke with Dr. Myrtie Neitheranis concerning potential transfer of this patient [facility] a [facility].  I offered the patient and his family the opportunity to have laparoscopic cholecystectomy with an intraoperative cholangiogram performed tomorrow with the possibility that the patient is passed a common bile duct stone and it would obviate the need for an ERCP and [facility].  Potentially, the patient would have a common bile duct stone noted at the time of intraoperative cholangiogram requiring a postoperative ERCP and subsequent [facility].  Dr. Myrtie Neitheranis is in the process of identifying a gastro[facility]ologist [facility] that [facility] the patient for complex ERCP.  Up to this point we have no accepting facility.  Potentially performing the laparoscopic cholecystectomy prior to the ERCP could set the patient up for requiring a second surgical procedure if they are unable to clear his common bile duct endoscopically.  I would expect that this would be a rare situation however I cannot say that the chances are 0.  The patient's father is a former anesthesiologist and currently a Teacher, adult educationpracticing psychiatrist.  He is out of town but I was able to speak with him in detail about all the potential risks and benefits of going ahead with surgery currently.  I left it with the family that I would recheck the patient in the morning and if he is not having any significant problems with elevated lipase, increasing liver function tests, and or fevers or other signs of worsening pancreatitis and or peritonitis, but I would consider going ahead with a laparoscopic cholecystectomy with an intraoperative cholangiogram understanding that the patient may still in the a postoperative ERCP.  We will keep the patient on a bland diet tonight and make him  n.p.o. after midnight with the possibility of the set up for surgery tomorrow.  If we happen to find a facility that will accept him in transfer today or within the next 24 hours we may postpone the laparoscopic cholecystectomy with intraoperative cholangiogram.  The patient goes by the name Alex Potter.  Marta LamasJames O. Gae BonWyatt, III, MD, FACS [phone]--pager [phone]--office [facility]

## 2017-07-04 NOTE — Anesthesia Preprocedure Evaluation (Addendum)
Anesthesia Evaluation  Patient identified by MRN, date of birth, ID band Patient awake    Reviewed: Allergy & Precautions, NPO status , Patient's Chart, lab work & pertinent test results  Airway Mallampati: I       Dental no notable dental hx. (+) Teeth Intact, Chipped,    Pulmonary Current Smoker,    Pulmonary exam normal breath sounds clear to auscultation       Cardiovascular negative cardio ROS Normal cardiovascular exam Rhythm:Regular Rate:Normal     Neuro/Psych PSYCHIATRIC DISORDERS Depression negative neurological ROS     GI/Hepatic Neg liver ROS,   Endo/Other  negative endocrine ROS  Renal/GU negative Renal ROS  negative genitourinary   Musculoskeletal negative musculoskeletal ROS (+)   Abdominal Normal abdominal exam  (+)   Peds negative pediatric ROS (+)  Hematology negative hematology ROS (+)   Anesthesia Other Findings   Reproductive/Obstetrics                            Anesthesia Physical Anesthesia Plan  ASA: II  Anesthesia Plan: General   Post-op Pain Management:    Induction: Intravenous  PONV Risk Score and Plan: 2 and Ondansetron and Dexamethasone  Airway Management Planned: Oral ETT  Additional Equipment:   Intra-op Plan:   Post-operative Plan: Extubation in OR  Informed Consent: I have reviewed the patients History and Physical, chart, labs and discussed the procedure including the risks, benefits and alternatives for the proposed anesthesia with the patient or authorized representative who has indicated his/her understanding and acceptance.   Dental advisory given  Plan Discussed with: CRNA  Anesthesia Plan Comments:         Anesthesia Quick Evaluation

## 2017-07-04 NOTE — Consult Note (Signed)
Reason for Consult:abd pain Referring Physician: Dr Karen Kitchens Alex Potter is an 22 y.o. male.  HPI: 34 yom who is currently in rehab presents with two week history of ruq and back pain intermittently that developed into jaundice overnight. He was sent to er. No n/v, has ruq and back pain now.  Having bms.  No diarrhea.  He was noted to have gallstones and elevated lfts. We were asked to see him  Past Medical History:  Diagnosis Date  . Drug abuse ([place])   . Tobacco abuse     Past Surgical History:  Procedure Laterality Date  . INGUINAL HERNIA REPAIR Right     No family history on file.  Social History:  reports that he has been smoking.  he has never used smokeless tobacco. He reports that he drinks alcohol. He reports that he uses drugs. Drugs: Marijuana and Methamphetamines.  Allergies:  Allergies  Allergen Reactions  . Mushroom Extract Complex Itching and Swelling    Medications: I have reviewed the patient's current medications.  Results for orders placed or performed during the hospital encounter of 07/03/17 (from the past 48 hour(s))  Protime-INR     Status: None   Collection Time: 07/03/17  2:47 PM  Result Value Ref Range   Prothrombin Time 13.1 11.4 - 15.2 seconds   INR 1.00     Comment: Performed at [facility], [place] [address] [place], [place] [address]  Lipase, blood     Status: Abnormal   Collection Time: 07/03/17  3:00 PM  Result Value Ref Range   Lipase 279 (H) 11 - 51 U/L    Comment: Performed at [facility] [address], [place],  [address]  Comprehensive metabolic panel     Status: Abnormal   Collection Time: 07/03/17  3:00 PM  Result Value Ref Range   Sodium 139 135 - 145 mmol/L   Potassium 4.3 3.5 - 5.1 mmol/L   Chloride 104 101 - 111 mmol/L   CO2 25 22 - 32 mmol/L   Glucose, Bld 114 (H) 65 - 99 mg/dL   BUN 7 6 - 20 mg/dL   Creatinine, Ser 0.91 0.61 - 1.24 mg/dL   Calcium 9.6 8.9 - 10.3 mg/dL   Total Protein 7.6 6.5 - 8.1 g/dL    Albumin 3.8 3.5 - 5.0 g/dL   AST 158 (H) 15 - 41 U/L   ALT 288 (H) 17 - 63 U/L   Alkaline Phosphatase 277 (H) 38 - 126 U/L   Total Bilirubin 5.8 (H) 0.3 - 1.2 mg/dL   GFR calc non Af Amer >60 >60 mL/min   GFR calc Af Amer >60 >60 mL/min    Comment: (NOTE) The eGFR has been calculated using the CKD EPI equation. This calculation has not been validated in all clinical situations. eGFR's persistently <60 mL/min signify possible Chronic Kidney Disease.    Anion gap 10 5 - 15    Comment: Performed at [facility] [address] [place] [address]  CBC     Status: Abnormal   Collection Time: 07/03/17  3:00 PM  Result Value Ref Range   WBC 6.6 4.0 - 10.5 K/uL   RBC 5.42 4.22 - 5.81 MIL/uL   Hemoglobin 14.0 13.0 - 17.0 g/dL   HCT 43.1 39.0 - 52.0 %   MCV 79.5 78.0 - 100.0 fL   MCH 25.8 (L) 26.0 - 34.0 pg   MCHC 32.5 30.0 - 36.0 g/dL  RDW 15.5 11.5 - 15.5 %   Platelets 284 150 - 400 K/uL    Comment: Performed at [facility], [place] [address], [place], [place] [address]  Hepatitis panel, acute     Status: None   Collection Time: 07/03/17  4:38 PM  Result Value Ref Range   Hepatitis B Surface Ag Negative Negative   HCV Ab <0.1 0.0 - 0.9 s/co ratio    Comment: (NOTE)                                  Negative:     < 0.8                             Indeterminate: 0.8 - 0.9                                  Positive:     > 0.9 The CDC recommends that a positive HCV antibody result be followed up with a HCV Nucleic Acid Amplification test (286381). Performed At: [facility] [place], [place] [id number] Rush Farmer MD [id number]    Hep A IgM Negative Negative   Hep B C IgM Negative Negative    Comment: Performed at [facility], [place] [address] [place], [place] [address]  Urinalysis, Routine w reflex microscopic     Status: Abnormal   Collection Time: 07/03/17  9:19 PM  Result Value Ref Range   Color, Urine AMBER (A) YELLOW     Comment: BIOCHEMICALS MAY BE AFFECTED BY COLOR   APPearance CLEAR CLEAR   Specific Gravity, Urine 1.025 1.005 - 1.030   pH 5.0 5.0 - 8.0   Glucose, UA NEGATIVE NEGATIVE mg/dL   Hgb urine dipstick NEGATIVE NEGATIVE   Bilirubin Urine MODERATE (A) NEGATIVE   Ketones, ur 5 (A) NEGATIVE mg/dL   Protein, ur NEGATIVE NEGATIVE mg/dL   Nitrite NEGATIVE NEGATIVE   Leukocytes, UA NEGATIVE NEGATIVE    Comment: Performed at [facility] [address], [place], [place] [address]  Rapid urine drug screen (hospital performed)     Status: Abnormal   Collection Time: 07/03/17  9:19 PM  Result Value Ref Range   Opiates NONE DETECTED NONE DETECTED   Cocaine NONE DETECTED NONE DETECTED   Benzodiazepines NONE DETECTED NONE DETECTED   Amphetamines NONE DETECTED NONE DETECTED   Tetrahydrocannabinol POSITIVE (A) NONE DETECTED   Barbiturates NONE DETECTED NONE DETECTED    Comment: (NOTE) DRUG SCREEN FOR MEDICAL PURPOSES ONLY.  IF CONFIRMATION IS NEEDED FOR ANY PURPOSE, NOTIFY LAB WITHIN 5 DAYS. LOWEST DETECTABLE LIMITS FOR URINE DRUG SCREEN Drug Class                     Cutoff (ng/mL) Amphetamine and metabolites    1000 Barbiturate and metabolites    200 Benzodiazepine                 774 Tricyclics and metabolites     300 Opiates and metabolites        300 Cocaine and metabolites        300 THC                            50 Performed  at [facility], [place] [address], [place], [place] [address]   Ammonia     Status: Abnormal   Collection Time: 07/03/17  9:49 PM  Result Value Ref Range   Ammonia 42 (H) 9 - 35 umol/L    Comment: Performed at [facility], [place] [address] [place], [place] [address]  APTT     Status: None   Collection Time: 07/04/17 12:14 AM  Result Value Ref Range   aPTT 33 24 - 36 seconds    Comment: Performed at [facility] [address] [place], [place] [address]  Type and screen [place]     Status: None   Collection  Time: 07/04/17 12:18 AM  Result Value Ref Range   ABO/RH(D) O POS    Antibody Screen NEG    Sample Expiration      07/07/2017 Performed at [facility], [place] [address], [place], [place] [address]   ABO/Rh     Status: None   Collection Time: 07/04/17 12:18 AM  Result Value Ref Range   ABO/RH(D)      O POS Performed at [facility] [address], [place], [place] [address]   MRSA PCR Screening     Status: None   Collection Time: 07/04/17  1:42 AM  Result Value Ref Range   MRSA by PCR NEGATIVE NEGATIVE    Comment:        The GeneXpert MRSA Assay (FDA approved for NASAL specimens only), is one component of a comprehensive MRSA colonization surveillance program. It is not intended to diagnose MRSA infection nor to guide or monitor treatment for MRSA infections. Performed at [facility], [place] [address], [place], [place] [address]     US Abdomen Limited Ruq  Result Date: 07/03/2017 CLINICAL DATA:  22 year old male with history of right upper quadrant abdominal pain and jaundice for the past 2 weeks. EXAM: ULTRASOUND ABDOMEN LIMITED RIGHT UPPER QUADRANT COMPARISON:  None. FINDINGS: Gallbladder: Large amount of biliary sludge and multiple small gallstones filling the lumen of the gallbladder. Gallbladder is moderately distended. Gallbladder wall is thickened measuring 7.4 mm. No definite pericholecystic fluid. However, per report, sonographic Murphy sign was positive. Common bile duct: Diameter: 10.4 mm in the porta hepatis. Low level echogenicity (without shadowing) in the common bile duct could indicate the presence of intraductal sludge. Liver: No focal lesion identified. There appears to be some mild intrahepatic biliary ductal dilatation. Within normal limits in parenchymal echogenicity. Portal vein is patent on color Doppler imaging with normal direction of blood flow towards the liver. IMPRESSION: 1. Biliary sludge and cholelithiasis with evidence suggestive of  acute cholecystitis, as well as common bile duct dilatation and mild intrahepatic biliary ductal dilatation, concerning for ductal obstruction (potentially related to intraductal sludge). Surgical consultation is recommended. Electronically Signed   By: Vinnie Langton M.D.   On: 07/03/2017 21:11    Review of Systems  Constitutional: Positive for malaise/fatigue.  HENT: Negative.   Gastrointestinal: Positive for abdominal pain.  All other systems reviewed and are negative.  Blood pressure 123/69, pulse (!) 55, temperature 97.6 F (36.4 C), temperature source Oral, resp. rate 18, height '5\' 5"'  (1.651 m), weight 74 kg (163 lb 2.3 oz), SpO2 97 %. Physical Exam  Vitals reviewed. Constitutional: He is oriented to person, place, and time. He appears well-developed and well-nourished.  HENT:  Head: Normocephalic and atraumatic.  Right Ear: External ear normal.  Left Ear:  External ear normal.  Eyes: Pupils are equal, round, and reactive to light. Scleral icterus is present.  Neck: Neck supple.  Cardiovascular: Normal rate, regular rhythm and normal heart sounds.  Respiratory: Effort normal and breath sounds normal. He has no wheezes. He has no rales.  GI: Soft. Bowel sounds are normal. He exhibits no distension. There is tenderness in the right upper quadrant. No hernia.  Musculoskeletal: Normal range of motion.  Neurological: He is alert and oriented to person, place, and time.  Skin: Skin is warm and dry.  Psychiatric: He has a normal mood and affect. His behavior is normal.    Assessment/Plan: Choledocholithiasis - recheck lfts/lipase now, if remaining elevated will need ercp -will need lap chole this admission once duct resolved -discussed plan with patient and his mother   Rolm Bookbinder 07/04/2017, 6:25 AM

## 2017-07-04 NOTE — Anesthesia Postprocedure Evaluation (Signed)
Anesthesia Post Note  Patient: Alex LiasJames Potter  Procedure(s) Performed: ENDOSCOPIC RETROGRADE CHOLANGIOPANCREATOGRAPHY (ERCP) (N/A )     Patient location during evaluation: Endoscopy Anesthesia Type: General Level of consciousness: awake Pain management: pain level controlled Vital Signs Assessment: post-procedure vital signs reviewed and stable Respiratory status: spontaneous breathing Cardiovascular status: stable Postop Assessment: no apparent nausea or vomiting Anesthetic complications: no    Last Vitals:  Vitals:   07/04/17 1457 07/04/17 1500  BP: 129/78 127/71  Pulse: 84 79  Resp: (!) 26 (!) 25  Temp:    SpO2: 98% 99%    Last Pain:  Vitals:   07/04/17 1220  TempSrc: Oral  PainSc: 6    Pain Goal: Patients Stated Pain Goal: 2 (07/04/17 1000)               [name],[name]

## 2017-07-04 NOTE — Progress Notes (Signed)
Back from Endoscopy via wheelchair. Alert, oriented, Mom at bedside. Iv infusing well. No signs of distress

## 2017-07-04 NOTE — ED Notes (Signed)
Pt's fellowship hall charge nurse notified about pt's hospital admission per family.

## 2017-07-04 NOTE — Op Note (Signed)
[facility] ([facility] Patient Name: Alex LiasJames Potter Procedure Date : 07/04/2017 MRN: [id number] Attending MD: Starr LakeHenry L. Myrtie Potter , MD Date of Birth: 1995-09-12 CSN: [id number] Age: 22 Admit Type: Inpatient Procedure:                ERCP Indications:              Abdominal pain of suspected biliary origin, Biliary                            dilation on Ultrasound, Bile duct stone on                            Ultrasound, Suspected bile duct stone(s), Jaundice Providers:                Sherilyn CooterHenry L. Myrtie Neitheranis, MD, Tomma RakersJennifer Kappus, RN, Arlee Muslimhris                            Chandler Tech., Technician, Vinnie Langtonabatha Berry CRNA, CRNA Referring MD:              Medicines:                General Anesthesia, Indomethacin 100 mg PR Complications:            No immediate complications. Estimated Blood Loss:     Estimated blood loss: none. Procedure:                Pre-Anesthesia Assessment:                           - Prior to the procedure, a History and Physical                            was performed, and patient medications and                            allergies were reviewed. The patient's tolerance of                            previous anesthesia was also reviewed. The risks                            and benefits of the procedure and the sedation                            options and risks were discussed with the patient.                            All questions were answered, and informed consent                            was obtained. Prior Anticoagulants: The patient has                            taken no previous anticoagulant or antiplatelet  agents. ASA Grade Assessment: II - A patient with                            mild systemic disease. After reviewing the risks                            and benefits, the patient was deemed in                            satisfactory condition to undergo the procedure.                           After obtaining informed consent, the scope  was                            passed under direct vision. Throughout the                            procedure, the patient's blood pressure, pulse, and                            oxygen saturations were monitored continuously. The                            [id number] scope was introduced through the                            mouth, and used to inject contrast into and used to                            inject contrast into the ventral pancreatic duct.                            The ERCP was unusually difficult due to challenging                            cannulation. The patient tolerated the procedure                            well. Scope In: Scope Out: Findings:      The scout film was normal. The scope was advanced to the major papilla       in the descending duodenum. The papilla was edematous with a small       punctum. Examination of the pharynx, larynx and associated structures,       and upper GI tract was normal. The minor papilla was not found.       Cannulation of the bile duct was not achieved after considerable effort       of approximately one hour. The ventral pancreatic duct was superficially       cannulated with the traction (standard) sphincterotome. A brief contrast       injection was performed after wire placement to determine which duct was       cannulated. I personally interpreted the pancreatic duct images. Ductal  flow of contrast was adequate. Contrast extended to the proximal       pancreatic duct and the no further injection was performed. The ventral       pancreatic duct in the head of the pancreas was normal. A short 0.025       inch Jagwire was left in the ventral pancreatic duct. Further attempts       to cannulate the bile duct or pass a wire into it with the PD wire in       place were unsuccessful. The procedure was therefore terminated. The       total fluoroscopy exposure time was 46 seconds. Impression:               - Attempts at  a cholangiogram failed. Recommendation:           - Continue present medications.                           - The patient will require transfer to academic                            medical center for attempt at ERCP by advanced                            biliary endoscopist. Procedure Code(s):        --- Professional ---                           [phone], Endoscopic retrograde                            cholangiopancreatography (ERCP); diagnostic,                            including collection of specimen(s) by brushing or                            washing, when performed (separate procedure) Diagnosis Code(s):        --- Professional ---                           R10.9, Unspecified abdominal pain                           K80.50, Calculus of bile duct without cholangitis                            or cholecystitis without obstruction                           R17, Unspecified jaundice                           K83.8, Other specified diseases of biliary tract CPT copyright 2016 [employer]. All rights reserved. The codes documented in this report are preliminary and upon coder review may  be revised to meet current compliance requirements. Alex L. Myrtie Neither, MD 07/04/2017 2:49:01 PM This report has been signed electronically. Number of Addenda: 0

## 2017-07-04 NOTE — H&P (View-Only) (Signed)
Referring Provider: [facility]   Primary Care Physician:  Patient, No Pcp Per Primary Gastroenterologist:   unassigned  Reason for Consultation: abnormal liver labs and u/s  ASSESSMENT AND PLAN:    64.  22 year old male with probable choledocholelithiasis and mild gallstone pancreatitis. He presented with progressive RUQ pain over the last couple of weeks. Lipase 279 yesterday, down to 163 today. U/S remarkable for CBD duct dilation. Some improvement in liver labs overnight. WBC normal.  -Patient will need ERCP with stone extraction and probable sphincterotomy.  Will need anesthesia support.  We will plan for this procedure to be done sometime after lunch today.  The risk and benefits of procedure were discussed with patient and his mother in the room.  Patient agrees to proceed.  2. Drug abuse, currently in Rehab   HPI: Alex Potter is a 22 y.o. male currently in rehab, admitted last night with nonradiating RUQ pain and jaundice. U/S remarkable with cholelithiasis and CBD dilation.  Some improvement in liver labs overnight. Pain started a few weeks ago.  He describes it as constant discomfort.  Over the last few days pain became unbearable. No associated vomiting.  Mother in room and helps with history.  Patient has been eating very little lately because of the pain.  No fevers or chills.  Patient has no chronic GI complaints.  Bowel movements are regular.Mild improvement in transaminases and cholestasis overnight. Tmax 99.9, normal WBC  Past Medical History:  Diagnosis Date  . Drug abuse (HCC)   . Tobacco abuse     Past Surgical History:  Procedure Laterality Date  . INGUINAL HERNIA REPAIR Right     Prior to Admission medications   Medication Sig Start Date End Date Taking? Authorizing Provider  alum & mag hydroxide-simeth (MAALOX/MYLANTA) 200-200-20 MG/5ML suspension Take 15-30 mLs by mouth as needed for indigestion or heartburn.   Yes [provider]  Benzocaine  15 MG LOZG Use as directed 1 each in the mouth or throat every 4 (four) hours as needed (for sore throat).   Yes [provider]  benzonatate (TESSALON) 100 MG capsule Take 200 mg by mouth 3 (three) times daily as needed for cough.   Yes [provider]  cetirizine (ZYRTEC) 10 MG tablet Take 10 mg by mouth daily as needed for allergies.   Yes [provider]  DULoxetine (CYMBALTA) 30 MG capsule Take 30 mg by mouth daily.   Yes [provider]  Fructose-Dextrose-Phosphor Acd (NAUSEA CONTROL) 1.87-1.87-21.5 SOLN Take 5-10 mLs by mouth every 15 (fifteen) minutes as needed (for nausea).   Yes [provider]  guaiFENesin (MUCINEX) 600 MG 12 hr tablet Take 600 mg by mouth 2 (two) times daily as needed for cough or to loosen phlegm.   Yes [provider]  ibuprofen (ADVIL,MOTRIN) 600 MG tablet Take 600 mg by mouth every 6 (six) hours as needed for fever or mild pain.   Yes [provider]  loperamide (IMODIUM A-D) 2 MG tablet Take 2 mg by mouth 4 (four) times daily as needed for diarrhea or loose stools.   Yes [provider]  Melatonin 5 MG TABS Take 5-10 mg by mouth at bedtime as needed (for insomnia).   Yes [provider]  methocarbamol (ROBAXIN) 500 MG tablet Take 1,000 mg by mouth every 8 (eight) hours as needed for muscle spasms.   Yes [provider]  Multiple Vitamin (MULTIVITAMIN WITH MINERALS) TABS tablet Take 1 tablet by mouth daily.  Yes [provider]  ondansetron (ZOFRAN) 8 MG tablet Take 8 mg by mouth 2 (two) times daily as needed for nausea or vomiting.   Yes [provider]    Current Facility-Administered Medications  Medication Dose Route Frequency Provider Last Rate Last Dose  . 0.9 %  sodium chloride infusion   Intravenous Continuous Lorretta HarpNiu, Xilin, MD 125 mL/hr at 07/03/17 2330    . DULoxetine (CYMBALTA) DR capsule 30 mg  30 mg Oral Daily Lorretta HarpNiu, Xilin, MD      . guaiFENesin  (MUCINEX) 12 hr tablet 600 mg  600 mg Oral BID PRN Lorretta HarpNiu, Xilin, MD      . ibuprofen (ADVIL,MOTRIN) tablet 400 mg  400 mg Oral Q6H PRN Lorretta HarpNiu, Xilin, MD   400 mg at 07/04/17 0411  . [START ON 07/05/2017] Influenza vac split quadrivalent PF (FLUARIX) injection 0.5 mL  0.5 mL Intramuscular Tomorrow-1000 Lorretta HarpNiu, Xilin, MD      . ketorolac (TORADOL) 15 MG/ML injection 15 mg  15 mg Intravenous Q8H PRN Lorretta HarpNiu, Xilin, MD      . loperamide (IMODIUM) capsule 2 mg  2 mg Oral QID PRN Lorretta HarpNiu, Xilin, MD      . loratadine (CLARITIN) tablet 10 mg  10 mg Oral Daily Lorretta HarpNiu, Xilin, MD      . Melatonin TABS 3-6 mg  3-6 mg Oral QHS PRN Lorretta HarpNiu, Xilin, MD      . methocarbamol (ROBAXIN) tablet 1,000 mg  1,000 mg Oral Q8H PRN Lorretta HarpNiu, Xilin, MD   1,000 mg at 07/04/17 0411  . multivitamin with minerals tablet 1 tablet  1 tablet Oral Daily Lorretta HarpNiu, Xilin, MD      . nicotine (NICODERM CQ - dosed in mg/24 hours) patch 21 mg  21 mg Transdermal Daily Lorretta HarpNiu, Xilin, MD      . ondansetron [facility](ZOFRAN) tablet 4 mg  4 mg Oral Q6H PRN Lorretta HarpNiu, Xilin, MD       Or  . ondansetron (ZOFRAN) injection 4 mg  4 mg Intravenous Q6H PRN Lorretta HarpNiu, Xilin, MD      . piperacillin-tazobactam (ZOSYN) IVPB 3.375 g  3.375 g Intravenous Alean RinneQ8H Niu, Xilin, MD   Stopped at 07/04/17 [phone]  . [START ON 07/05/2017] pneumococcal 23 valent vaccine (PNU-IMMUNE) injection 0.5 mL  0.5 mL Intramuscular Tomorrow-1000 Lorretta HarpNiu, Xilin, MD        Allergies as of 07/03/2017 - Review Complete 07/03/2017  Allergen Reaction Noted  . Mushroom extract complex Itching and Swelling 07/03/2017    No family history on file. Patient adopted, family history not known  Social History   Socioeconomic History  . Marital status: Single    Spouse name: Not on file  . Number of children: Not on file  . Years of education: Not on file  . Highest education level: Not on file  Social Needs  . Financial resource strain: Not on file  . Food insecurity - worry: Not on file  . Food insecurity - inability: Not on file  .  Transportation needs - medical: Not on file  . Transportation needs - non-medical: Not on file  Occupational History  . Not on file  Tobacco Use  . Smoking status: Current Every Day Smoker  . Smokeless tobacco: Never Used  Substance and Sexual Activity  . Alcohol use: Yes  . Drug use: Yes    Types: Marijuana, Methamphetamines  . Sexual activity: Not on file  Other Topics Concern  . Not on file  Social History Narrative  . Not on file  Review of Systems: All systems reviewed and negative except where noted in HPI.  Physical Exam: Vital signs in last 24 hours: Temp:  [97.6 F (36.4 C)-99.9 F (37.7 C)] 97.6 F (36.4 C) (02/21 0424) Pulse Rate:  [54-101] 55 (02/21 0424) Resp:  [17-24] 18 (02/21 0424) BP: (102-128)/(53-88) 123/69 (02/21 0424) SpO2:  [96 %-100 %] 97 % (02/21 0424) Weight:  [163 lb 2.3 oz (74 kg)] 163 lb 2.3 oz (74 kg) (02/21 0130) Last BM Date: 07/04/17 General:   Alert, well-developed,  white male in NAD Psych:  Pleasant, cooperative. Normal mood and affect. Eyes:  Pupils equal, sclera clear, no icterus.   Conjunctiva pink. Ears:  Normal auditory acuity. Nose:  No deformity, discharge,  or lesions. Neck:  Supple; no masses Lungs:  Clear throughout to auscultation.   No wheezes, crackles, or rhonchi.  Heart:  Regular rate and rhythm, no edema Abdomen:  Soft, non-distended, nontender, BS active, no palp mass    Rectal:  Deferred  Msk:  Symmetrical without gross deformities. . Neurologic:  Alert and  oriented x4;  grossly normal neurologically. Skin:  Intact without significant lesions or rashes..   Intake/Output from previous day: 02/20 0701 - 02/21 0700 In: 2191.7 [I.V.:1591.7; IV Piggyback:600] Out: 200 [Urine:200] Intake/Output this shift: No intake/output data recorded.  Lab Results: Recent Labs    07/03/17 1500  WBC 6.6  HGB 14.0  HCT 43.1  PLT 284   BMET Recent Labs    07/03/17 1500 07/04/17 0621  NA 139 138  K 4.3 3.8  CL 104  106  CO2 25 22  GLUCOSE 114* 91  BUN 7 7  CREATININE 0.91 1.00  CALCIUM 9.6 8.8*   LFT Recent Labs    07/04/17 0621  PROT 6.2*  ALBUMIN 3.1*  AST 133*  ALT 239*  ALKPHOS 260*  BILITOT 5.2*   PT/INR Recent Labs    07/03/17 1447  LABPROT 13.1  INR 1.00   Hepatitis Panel Recent Labs    07/03/17 1638  HEPBSAG Negative  HCVAB <0.1  HEPAIGM Negative  HEPBIGM Negative    Studies/Results: US Abdomen Limited Ruq  Result Date: 07/03/2017 CLINICAL DATA:  22 year old male with history of right upper quadrant abdominal pain and jaundice for the past 2 weeks. EXAM: ULTRASOUND ABDOMEN LIMITED RIGHT UPPER QUADRANT COMPARISON:  None. FINDINGS: Gallbladder: Large amount of biliary sludge and multiple small gallstones filling the lumen of the gallbladder. Gallbladder is moderately distended. Gallbladder wall is thickened measuring 7.4 mm. No definite pericholecystic fluid. However, per report, sonographic Murphy sign was positive. Common bile duct: Diameter: 10.4 mm in the porta hepatis. Low level echogenicity (without shadowing) in the common bile duct could indicate the presence of intraductal sludge. Liver: No focal lesion identified. There appears to be some mild intrahepatic biliary ductal dilatation. Within normal limits in parenchymal echogenicity. Portal vein is patent on color Doppler imaging with normal direction of blood flow towards the liver. IMPRESSION: 1. Biliary sludge and cholelithiasis with evidence suggestive of acute cholecystitis, as well as common bile duct dilatation and mild intrahepatic biliary ductal dilatation, concerning for ductal obstruction (potentially related to intraductal sludge). Surgical consultation is recommended. Electronically Signed   By: Trudie Reed M.D.   On: 07/03/2017 21:11     Willette Cluster, NP-C @  07/04/2017, 9:17 AM  Pager number [phone]  I have reviewed the entire case in detail with the above APP and discussed the plan in  detail.  Therefore, I agree with the diagnoses recorded  above. In addition,  I have personally interviewed and examined the patient and have personally reviewed any abdominal/pelvic CT scan images.  My additional thoughts are as follows:  RUQ pain Choledocholithiasis without cholangitis. Jaundice Mild biliary pancreatitis    ERCP today. Lap chole tomorrow Saw him with his mother in preop.  Procedure described in detail and he is agreeable.  The benefits and risks of the planned procedure were described in detail with the patient or (when appropriate) their health care proxy.  Risks were outlined as including, but not limited to, bleeding, infection, perforation, adverse medication reaction leading to cardiac or pulmonary decompensation, or pancreatitis (if ERCP).  The limitation of incomplete mucosal visualization was also discussed.  No guarantees or warranties were given.  Charlie Pitter III Pager [phone]  Mon-Fri 8a-5p [phone] after 5p, weekends, holidays

## 2017-07-04 NOTE — Progress Notes (Signed)
PROGRESS NOTE    Alex Potter  ZHY:[id number] DOB: 04-25-1996 DOA: 07/03/2017 PCP: Patient, No Pcp Per   Brief Narrative: Alex Potter is a 22 y.o. male with medical history significant of drug abuse, tobacco abuse, alcohol abuse, depression. He presented secondary to right upper quadrant pain and found to have cholecystitis and CBD dilation.   Assessment & Plan:   Principal Problem:   Right upper quadrant abdominal pain Active Problems:   Acute cholecystitis   Gallstone   Tobacco abuse   Drug abuse (HCC)   Cholecystitis   Abnormal liver function   Depression   Acute cholecystitis Still with some right sided pain. -Pain management -Antibiotics -Surgery recommendations  CBD Abnormal liver function Hyperbilirubinemia GI to perform ERCP today  Tobacco abuse Polysubstance abuse Counseled. Currently at [facility]  Depression Stable -Continue Cymbalta   DVT prophylaxis: SCDs Code Status:   Code Status: Full Code Family Communication: None at bedside Disposition Plan: Pending general surgery and GI   Consultants:   General surgery  Gastroenterology  Procedures:   None  Antimicrobials:  Zosyn    Subjective: RUQ pain  Objective: Vitals:   07/03/17 2345 07/04/17 0000 07/04/17 0130 07/04/17 0424  BP: (!) 106/53 119/83 121/78 123/69  Pulse: (!) 101 91 70 (!) 55  Resp: 19 (!) 24 19 18   Temp:   97.9 F (36.6 C) 97.6 F (36.4 C)  TempSrc:   Oral Oral  SpO2: 99% 96% 97% 97%  Weight:   74 kg (163 lb 2.3 oz)   Height:   5\' 5"  (1.651 m)     Intake/Output Summary (Last 24 hours) at 07/04/2017 1056 Last data filed at 07/04/2017 0913 Gross per 24 hour  Intake 2191.67 ml  Output 200 ml  Net 1991.67 ml   Filed Weights   07/04/17 0130  Weight: 74 kg (163 lb 2.3 oz)    Examination:  General exam: Appears calm and comfortable HEENT: mild scleral icterus Respiratory system: Clear to auscultation. Respiratory effort normal. Cardiovascular  system: S1 & S2 heard, RRR. No murmurs, rubs, gallops or clicks. Gastrointestinal system: Abdomen is nondistended, soft. No organomegaly or masses felt. Normal bowel sounds heard. Central nervous system: Alert and oriented. No focal neurological deficits. Extremities: No edema. No calf tenderness Skin: No cyanosis. No rashes. Jaundice of forehead Psychiatry: Judgement and insight appear normal. Mood & affect appropriate.     Data Reviewed: I have personally reviewed following labs and imaging studies  CBC: Recent Labs  Lab 07/03/17 1500  WBC 6.6  HGB 14.0  HCT 43.1  MCV 79.5  PLT 284   Basic Metabolic Panel: Recent Labs  Lab 07/03/17 1500 07/04/17 0621  NA 139 138  K 4.3 3.8  CL 104 106  CO2 25 22  GLUCOSE 114* 91  BUN 7 7  CREATININE 0.91 1.00  CALCIUM 9.6 8.8*   GFR: Estimated Creatinine Clearance: 109.9 mL/min (by C-G formula based on SCr of 1 mg/dL). Liver Function Tests: Recent Labs  Lab 07/03/17 1500 07/04/17 0621  AST 158* 133*  ALT 288* 239*  ALKPHOS 277* 260*  BILITOT 5.8* 5.2*  PROT 7.6 6.2*  ALBUMIN 3.8 3.1*   Recent Labs  Lab 07/03/17 1500 07/04/17 0621  LIPASE 279* 163*   Recent Labs  Lab 07/03/17 2149  AMMONIA 42*   Coagulation Profile: Recent Labs  Lab 07/03/17 1447  INR 1.00   Cardiac Enzymes: No results for input(s): CKTOTAL, CKMB, CKMBINDEX, TROPONINI in the last 168 hours. BNP (last 3 results)  No results for input(s): PROBNP in the last 8760 hours. HbA1C: No results for input(s): HGBA1C in the last 72 hours. CBG: Recent Labs  Lab 07/04/17 0758  GLUCAP 76   Lipid Profile: No results for input(s): CHOL, HDL, LDLCALC, TRIG, CHOLHDL, LDLDIRECT in the last 72 hours. Thyroid Function Tests: No results for input(s): TSH, T4TOTAL, FREET4, T3FREE, THYROIDAB in the last 72 hours. Anemia Panel: No results for input(s): VITAMINB12, FOLATE, FERRITIN, TIBC, IRON, RETICCTPCT in the last 72 hours. Sepsis Labs: No results for  input(s): PROCALCITON, LATICACIDVEN in the last 168 hours.  Recent Results (from the past 240 hour(s))  MRSA PCR Screening     Status: None   Collection Time: 07/04/17  1:42 AM  Result Value Ref Range Status   MRSA by PCR NEGATIVE NEGATIVE Final    Comment:        The GeneXpert MRSA Assay (FDA approved for NASAL specimens only), is one component of a comprehensive MRSA colonization surveillance program. It is not intended to diagnose MRSA infection nor to guide or monitor treatment for MRSA infections. Performed at [facility], [address] [place], [place] [address]          Radiology Studies: Kore[place] Abdomen Limited Ruq  Result Date: 07/03/2017 CLINICAL DATA:  22 year old male with history of right upper quadrant abdominal pain and jaundice for the past 2 weeks. EXAM: ULTRASOUND ABDOMEN LIMITED RIGHT UPPER QUADRANT COMPARISON:  None. FINDINGS: Gallbladder: Large amount of biliary sludge and multiple small gallstones filling the lumen of the gallbladder. Gallbladder is moderately distended. Gallbladder wall is thickened measuring 7.4 mm. No definite pericholecystic fluid. However, per report, sonographic Murphy sign was positive. Common bile duct: Diameter: 10.4 mm in the porta hepatis. Low level echogenicity (without shadowing) in the common bile duct could indicate the presence of intraductal sludge. Liver: No focal lesion identified. There appears to be some mild intrahepatic biliary ductal dilatation. Within normal limits in parenchymal echogenicity. Portal vein is patent on color Doppler imaging with normal direction of blood flow towards the liver. IMPRESSION: 1. Biliary sludge and cholelithiasis with evidence suggestive of acute cholecystitis, as well as common bile duct dilatation and mild intrahepatic biliary ductal dilatation, concerning for ductal obstruction (potentially related to intraductal sludge). Surgical consultation is recommended. Electronically Signed   By:  Trudie Reedaniel  Entrikin M.D.   On: 07/03/2017 21:11        Scheduled Meds: . DULoxetine  30 mg Oral Daily  . [START ON 07/05/2017] Influenza vac split quadrivalent PF  0.5 mL Intramuscular Tomorrow-1000  . loratadine  10 mg Oral Daily  . multivitamin with minerals  1 tablet Oral Daily  . nicotine  21 mg Transdermal Daily  . [START ON 07/05/2017] pneumococcal 23 valent vaccine  0.5 mL Intramuscular Tomorrow-1000   Continuous Infusions: . sodium chloride 125 mL/hr at 07/03/17 2330  . piperacillin-tazobactam (ZOSYN)  IV Stopped (07/04/17 0853)     LOS: 1 day     Jacquelin Hawkingalph Nettey, MD [facility] 07/04/2017, 10:56 AM Pager: [phone]  If 7PM-7AM, please contact night-coverage [email or web address] Password [email or web address] 07/04/2017, 10:56 AM

## 2017-07-05 ENCOUNTER — Inpatient Hospital Stay (HOSPITAL_COMMUNITY): Payer: BLUE CROSS/BLUE SHIELD

## 2017-07-05 ENCOUNTER — Inpatient Hospital Stay (HOSPITAL_COMMUNITY): Payer: BLUE CROSS/BLUE SHIELD | Admitting: Certified Registered"

## 2017-07-05 ENCOUNTER — Encounter (HOSPITAL_COMMUNITY)
Admission: EM | Disposition: A | Discharge status: Home / Self Care | Payer: Self-pay | Source: Home / Self Care | Attending: Family Medicine

## 2017-07-05 ENCOUNTER — Encounter (HOSPITAL_COMMUNITY): Payer: Self-pay | Admitting: Certified Registered"

## 2017-07-05 DIAGNOSIS — K810 Acute cholecystitis: Secondary | ICD-10-CM

## 2017-07-05 DIAGNOSIS — Z720 Tobacco use: Secondary | ICD-10-CM

## 2017-07-05 DIAGNOSIS — K7689 Other specified diseases of liver: Secondary | ICD-10-CM

## 2017-07-05 DIAGNOSIS — K8309 Other cholangitis: Secondary | ICD-10-CM

## 2017-07-05 HISTORY — PX: CHOLECYSTECTOMY: SHX55

## 2017-07-05 LAB — CBC WITH DIFFERENTIAL/PLATELET
Basophils Absolute: 0 10*3/uL (ref 0.0–0.1)
Basophils Relative: 0 %
EOS PCT: 0 %
Eosinophils Absolute: 0 10*3/uL (ref 0.0–0.7)
HEMATOCRIT: 37 % — AB (ref 39.0–52.0)
HEMOGLOBIN: 11.7 g/dL — AB (ref 13.0–17.0)
LYMPHS ABS: 1.4 10*3/uL (ref 0.7–4.0)
LYMPHS PCT: 22 %
MCH: 25.1 pg — AB (ref 26.0–34.0)
MCHC: 31.6 g/dL (ref 30.0–36.0)
MCV: 79.4 fL (ref 78.0–100.0)
Monocytes Absolute: 0.9 10*3/uL (ref 0.1–1.0)
Monocytes Relative: 15 %
NEUTROS ABS: 3.9 10*3/uL (ref 1.7–7.7)
Neutrophils Relative %: 63 %
PLATELETS: 269 10*3/uL (ref 150–400)
RBC: 4.66 MIL/uL (ref 4.22–5.81)
RDW: 15.8 % — ABNORMAL HIGH (ref 11.5–15.5)
WBC: 6.2 10*3/uL (ref 4.0–10.5)

## 2017-07-05 LAB — COMPREHENSIVE METABOLIC PANEL
ALK PHOS: 267 U/L — AB (ref 38–126)
ALT: 227 U/L — AB (ref 17–63)
AST: 139 U/L — ABNORMAL HIGH (ref 15–41)
Albumin: 2.8 g/dL — ABNORMAL LOW (ref 3.5–5.0)
Anion gap: 13 (ref 5–15)
BILIRUBIN TOTAL: 4.8 mg/dL — AB (ref 0.3–1.2)
BUN: 6 mg/dL (ref 6–20)
CALCIUM: 8.9 mg/dL (ref 8.9–10.3)
CO2: 22 mmol/L (ref 22–32)
CREATININE: 0.91 mg/dL (ref 0.61–1.24)
Chloride: 107 mmol/L (ref 101–111)
Glucose, Bld: 94 mg/dL (ref 65–99)
Potassium: 3.9 mmol/L (ref 3.5–5.1)
Sodium: 142 mmol/L (ref 135–145)
TOTAL PROTEIN: 5.8 g/dL — AB (ref 6.5–8.1)

## 2017-07-05 LAB — LIPASE, BLOOD: LIPASE: 53 U/L — AB (ref 11–51)

## 2017-07-05 LAB — GLUCOSE, CAPILLARY: Glucose-Capillary: 85 mg/dL (ref 65–99)

## 2017-07-05 SURGERY — LAPAROSCOPIC CHOLECYSTECTOMY WITH INTRAOPERATIVE CHOLANGIOGRAM
Anesthesia: General

## 2017-07-05 SURGERY — LAPAROSCOPIC CHOLECYSTECTOMY WITH INTRAOPERATIVE CHOLANGIOGRAM
Anesthesia: General | Site: Abdomen

## 2017-07-05 MED ORDER — GABAPENTIN 300 MG PO CAPS
ORAL_CAPSULE | ORAL | Status: AC
  Filled 2017-07-05: qty 1

## 2017-07-05 MED ORDER — KETAMINE HCL-SODIUM CHLORIDE 100-0.9 MG/10ML-% IV SOSY
PREFILLED_SYRINGE | INTRAVENOUS | Status: AC
  Filled 2017-07-05: qty 10

## 2017-07-05 MED ORDER — IOPAMIDOL (ISOVUE-300) INJECTION 61%
INTRAVENOUS | Status: AC
  Filled 2017-07-05: qty 50

## 2017-07-05 MED ORDER — FENTANYL CITRATE (PF) 100 MCG/2ML IJ SOLN
25.0000 ug | INTRAMUSCULAR | Status: DC | PRN
  Administered 2017-07-05 (×2): 50 ug via INTRAVENOUS

## 2017-07-05 MED ORDER — BUPIVACAINE-EPINEPHRINE (PF) 0.5% -1:200000 IJ SOLN
INTRAMUSCULAR | Status: DC | PRN
  Administered 2017-07-05: 18 mL

## 2017-07-05 MED ORDER — PROPOFOL 10 MG/ML IV BOLUS
INTRAVENOUS | Status: DC | PRN
  Administered 2017-07-05: 150 mg via INTRAVENOUS

## 2017-07-05 MED ORDER — ACETAMINOPHEN 500 MG PO TABS
ORAL_TABLET | ORAL | Status: AC
  Filled 2017-07-05: qty 2

## 2017-07-05 MED ORDER — FENTANYL CITRATE (PF) 100 MCG/2ML IJ SOLN
INTRAMUSCULAR | Status: AC
  Administered 2017-07-05: 50 ug via INTRAVENOUS
  Filled 2017-07-05: qty 2

## 2017-07-05 MED ORDER — MIDAZOLAM HCL 5 MG/5ML IJ SOLN
INTRAMUSCULAR | Status: DC | PRN
  Administered 2017-07-05: 1 mg via INTRAVENOUS

## 2017-07-05 MED ORDER — FENTANYL CITRATE (PF) 250 MCG/5ML IJ SOLN
INTRAMUSCULAR | Status: DC | PRN
  Administered 2017-07-05: 50 ug via INTRAVENOUS
  Administered 2017-07-05: 25 ug via INTRAVENOUS
  Administered 2017-07-05: 100 ug via INTRAVENOUS

## 2017-07-05 MED ORDER — ACETAMINOPHEN 325 MG PO TABS
ORAL_TABLET | ORAL | Status: DC | PRN
Start: 2017-07-05 — End: 2017-07-05
  Administered 2017-07-05: 1000 mg via ORAL

## 2017-07-05 MED ORDER — 0.9 % SODIUM CHLORIDE (POUR BTL) OPTIME
TOPICAL | Status: DC | PRN
  Administered 2017-07-05: 1000 mL

## 2017-07-05 MED ORDER — BUPIVACAINE-EPINEPHRINE (PF) 0.5% -1:200000 IJ SOLN
INTRAMUSCULAR | Status: AC
  Filled 2017-07-05: qty 30

## 2017-07-05 MED ORDER — SODIUM CHLORIDE 0.9 % IV SOLN
INTRAVENOUS | Status: DC | PRN
  Administered 2017-07-05: 7 mL

## 2017-07-05 MED ORDER — HEMOSTATIC AGENTS (NO CHARGE) OPTIME
TOPICAL | Status: DC | PRN
  Administered 2017-07-05 (×2): 1 via TOPICAL

## 2017-07-05 MED ORDER — KETAMINE HCL 10 MG/ML IJ SOLN
INTRAMUSCULAR | Status: DC | PRN
  Administered 2017-07-05: 40 mg via INTRAVENOUS

## 2017-07-05 MED ORDER — CELECOXIB 200 MG PO CAPS
ORAL_CAPSULE | ORAL | Status: AC
  Filled 2017-07-05: qty 2

## 2017-07-05 MED ORDER — DEXAMETHASONE SODIUM PHOSPHATE 10 MG/ML IJ SOLN
INTRAMUSCULAR | Status: DC | PRN
  Administered 2017-07-05: 10 mg via INTRAVENOUS

## 2017-07-05 MED ORDER — PROPOFOL 10 MG/ML IV BOLUS
INTRAVENOUS | Status: AC
  Filled 2017-07-05: qty 20

## 2017-07-05 MED ORDER — ROCURONIUM BROMIDE 10 MG/ML (PF) SYRINGE
PREFILLED_SYRINGE | INTRAVENOUS | Status: DC | PRN
  Administered 2017-07-05: 50 mg via INTRAVENOUS
  Administered 2017-07-05: 5 mg via INTRAVENOUS

## 2017-07-05 MED ORDER — LIDOCAINE 2% (20 MG/ML) 5 ML SYRINGE
INTRAMUSCULAR | Status: DC | PRN
  Administered 2017-07-05: 100 mg via INTRAVENOUS

## 2017-07-05 MED ORDER — SUGAMMADEX SODIUM 200 MG/2ML IV SOLN
INTRAVENOUS | Status: DC | PRN
  Administered 2017-07-05: 150 mg via INTRAVENOUS

## 2017-07-05 MED ORDER — FENTANYL CITRATE (PF) 250 MCG/5ML IJ SOLN
INTRAMUSCULAR | Status: AC
  Filled 2017-07-05: qty 5

## 2017-07-05 MED ORDER — MIDAZOLAM HCL 2 MG/2ML IJ SOLN
INTRAMUSCULAR | Status: AC
  Filled 2017-07-05: qty 2

## 2017-07-05 MED ORDER — CELECOXIB 400 MG PO CAPS
400.0000 mg | ORAL_CAPSULE | Freq: Once | ORAL | Status: AC
  Administered 2017-07-05: 400 mg via ORAL
  Filled 2017-07-05: qty 1

## 2017-07-05 MED ORDER — SODIUM CHLORIDE 0.9 % IR SOLN
Status: DC | PRN
  Administered 2017-07-05 (×2): 1000 mL

## 2017-07-05 MED ORDER — LACTATED RINGERS IV SOLN
INTRAVENOUS | Status: DC
  Administered 2017-07-05 (×2): via INTRAVENOUS

## 2017-07-05 MED ORDER — DEXMEDETOMIDINE HCL IN NACL 200 MCG/50ML IV SOLN
INTRAVENOUS | Status: DC | PRN
  Administered 2017-07-05: 12 ug via INTRAVENOUS
  Administered 2017-07-05: 8 ug via INTRAVENOUS

## 2017-07-05 MED ORDER — OXYCODONE HCL 5 MG PO TABS
5.0000 mg | ORAL_TABLET | ORAL | Status: DC | PRN
  Administered 2017-07-05 – 2017-07-08 (×14): 5 mg via ORAL
  Filled 2017-07-05 (×14): qty 1

## 2017-07-05 SURGICAL SUPPLY — 44 items
APPLIER CLIP 5 13 M/L LIGAMAX5 (MISCELLANEOUS) ×2
BLADE CLIPPER SURG (BLADE) ×2 IMPLANT
CANISTER SUCT 3000ML PPV (MISCELLANEOUS) ×2 IMPLANT
CHLORAPREP W/TINT 26ML (MISCELLANEOUS) ×2 IMPLANT
CLIP APPLIE 5 13 M/L LIGAMAX5 (MISCELLANEOUS) ×1 IMPLANT
COVER MAYO STAND STRL (DRAPES) ×2 IMPLANT
COVER SURGICAL LIGHT HANDLE (MISCELLANEOUS) ×2 IMPLANT
DERMABOND ADVANCED (GAUZE/BANDAGES/DRESSINGS) ×1
DERMABOND ADVANCED .7 DNX12 (GAUZE/BANDAGES/DRESSINGS) ×1 IMPLANT
DRAPE C-ARM 42X72 X-RAY (DRAPES) ×2 IMPLANT
DRSG TEGADERM 2-3/8X2-3/4 SM (GAUZE/BANDAGES/DRESSINGS) ×2 IMPLANT
ELECT REM PT RETURN 9FT ADLT (ELECTROSURGICAL) ×2
ELECTRODE REM PT RTRN 9FT ADLT (ELECTROSURGICAL) ×1 IMPLANT
ENDOLOOP SUT PDS II  0 18 (SUTURE) ×1
ENDOLOOP SUT PDS II 0 18 (SUTURE) ×1 IMPLANT
GLOVE BIO SURGEON STRL SZ7.5 (GLOVE) ×2 IMPLANT
GLOVE BIOGEL PI IND STRL 7.5 (GLOVE) ×1 IMPLANT
GLOVE BIOGEL PI IND STRL 8 (GLOVE) ×1 IMPLANT
GLOVE BIOGEL PI INDICATOR 7.5 (GLOVE) ×1
GLOVE BIOGEL PI INDICATOR 8 (GLOVE) ×1
GLOVE ECLIPSE 7.5 STRL STRAW (GLOVE) ×2 IMPLANT
GOWN STRL REUS W/ TWL LRG LVL3 (GOWN DISPOSABLE) ×3 IMPLANT
GOWN STRL REUS W/TWL LRG LVL3 (GOWN DISPOSABLE) ×3
HEMOSTAT SNOW SURGICEL 2X4 (HEMOSTASIS) ×4 IMPLANT
KIT BASIN OR (CUSTOM PROCEDURE TRAY) ×2 IMPLANT
KIT ROOM TURNOVER OR (KITS) ×2 IMPLANT
NS IRRIG 1000ML POUR BTL (IV SOLUTION) ×2 IMPLANT
PAD ARMBOARD 7.5X6 YLW CONV (MISCELLANEOUS) ×2 IMPLANT
POUCH RETRIEVAL ECOSAC 10 (ENDOMECHANICALS) ×1 IMPLANT
POUCH RETRIEVAL ECOSAC 10MM (ENDOMECHANICALS) ×1
SCISSORS LAP 5X35 DISP (ENDOMECHANICALS) ×2 IMPLANT
SET CHOLANGIOGRAPH 5 50 .035 (SET/KITS/TRAYS/PACK) ×2 IMPLANT
SET IRRIG TUBING LAPAROSCOPIC (IRRIGATION / IRRIGATOR) ×2 IMPLANT
SLEEVE ENDOPATH XCEL 5M (ENDOMECHANICALS) ×4 IMPLANT
SPECIMEN JAR SMALL (MISCELLANEOUS) ×2 IMPLANT
STRIP CLOSURE SKIN 1/2X4 (GAUZE/BANDAGES/DRESSINGS) ×2 IMPLANT
SUT MNCRL AB 4-0 PS2 18 (SUTURE) ×2 IMPLANT
TOWEL OR 17X24 6PK STRL BLUE (TOWEL DISPOSABLE) ×2 IMPLANT
TOWEL OR 17X26 10 PK STRL BLUE (TOWEL DISPOSABLE) ×2 IMPLANT
TRAY LAPAROSCOPIC MC (CUSTOM PROCEDURE TRAY) ×2 IMPLANT
TROCAR XCEL BLUNT TIP 100MML (ENDOMECHANICALS) ×2 IMPLANT
TROCAR XCEL NON-BLD 5MMX100MML (ENDOMECHANICALS) ×2 IMPLANT
TUBING INSUFFLATION (TUBING) ×2 IMPLANT
WATER STERILE IRR 1000ML POUR (IV SOLUTION) ×2 IMPLANT

## 2017-07-05 NOTE — Progress Notes (Signed)
Report called to Leann in short stay.  

## 2017-07-05 NOTE — Transfer of Care (Signed)
Immediate Anesthesia Transfer of Care Note  Patient: Alex LiasJames Potter  Procedure(s) Performed: LAPAROSCOPIC CHOLECYSTECTOMY WITH INTRAOPERATIVE CHOLANGIOGRAM (N/A Abdomen)  Patient Location: PACU  Anesthesia Type:General  Level of Consciousness: drowsy  Airway & Oxygen Therapy: Patient Spontanous Breathing and Patient connected to nasal cannula oxygen  Post-op Assessment: Report given to RN and Post -op Vital signs reviewed and stable  Post vital signs: Reviewed and stable  Last Vitals:  Vitals:   07/05/17 0504 07/05/17 1554  BP: 102/64 130/83  Pulse: 67 67  Resp: 16 18  Temp: 36.6 C 36.6 C  SpO2: 99% 100%    Last Pain:  Vitals:   07/05/17 0823  TempSrc:   PainSc: 5       Patients Stated Pain Goal: 2 (07/04/17 1000)  Complications: No apparent anesthesia complications

## 2017-07-05 NOTE — Progress Notes (Signed)
[facility] Progress Note 1 Day Post-Op  Subjective: Patient doing okay.  Clinically he seems improved.  No acute distress  Objective: Vital signs in last 24 hours: Temp:  [97.9 F (36.6 C)-98.6 F (37 C)] 97.9 F (36.6 C) (02/22 0504) Pulse Rate:  [66-84] 67 (02/22 0504) Resp:  [14-26] 16 (02/22 0504) BP: (102-130)/(59-78) 102/64 (02/22 0504) SpO2:  [96 %-99 %] 99 % (02/22 0504) Weight:  [74 kg (163 lb 2.3 oz)] 74 kg (163 lb 2.3 oz) (02/21 1220) Last BM Date: 07/04/17  Intake/Output from previous day: 02/21 0701 - 02/22 0700 In: 980 [P.O.:160; I.V.:820] Out: 2 [Blood:2] Intake/Output this shift: No intake/output data recorded.  General: No acute distress.  Lungs: Clear  Abd: Soft, mildly distended.  No severe abdominal pain.  Tender in the RUQ, positive Murphy's sign.  Good bowel sounds.  The patient is hungry  Extremities: No clinical signs or symptoms of DVT  Neuro: Intact  Lab Results:  @LABLAST2 (wbc:2,hgb:2,hct:2,plt:2) BMET ) Recent Labs    07/03/17 1500 07/04/17 0621  NA 139 138  K 4.3 3.8  CL 104 106  CO2 25 22  GLUCOSE 114* 91  BUN 7 7  CREATININE 0.91 1.00  CALCIUM 9.6 8.8*   PT/INR Recent Labs    07/03/17 1447  LABPROT 13.1  INR 1.00   ABG No results for input(s): PHART, HCO3 in the last 72 hours.  Invalid input(s): PCO2, PO2  Studies/Results: Dg C-arm 1-60 Min-no Report  Result Date: 07/04/2017 Fluoroscopy was utilized by the requesting physician.  No radiographic interpretation.   US Abdomen Limited Ruq  Result Date: 07/03/2017 CLINICAL DATA:  22 year old male with history of right upper quadrant abdominal pain and jaundice for the past 2 weeks. EXAM: ULTRASOUND ABDOMEN LIMITED RIGHT UPPER QUADRANT COMPARISON:  None. FINDINGS: Gallbladder: Large amount of biliary sludge and multiple small gallstones filling the lumen of the gallbladder. Gallbladder is moderately distended. Gallbladder wall is thickened measuring 7.4 mm. No definite  pericholecystic fluid. However, per report, sonographic Murphy sign was positive. Common bile duct: Diameter: 10.4 mm in the porta hepatis. Low level echogenicity (without shadowing) in the common bile duct could indicate the presence of intraductal sludge. Liver: No focal lesion identified. There appears to be some mild intrahepatic biliary ductal dilatation. Within normal limits in parenchymal echogenicity. Portal vein is patent on color Doppler imaging with normal direction of blood flow towards the liver. IMPRESSION: 1. Biliary sludge and cholelithiasis with evidence suggestive of acute cholecystitis, as well as common bile duct dilatation and mild intrahepatic biliary ductal dilatation, concerning for ductal obstruction (potentially related to intraductal sludge). Surgical consultation is recommended. Electronically Signed   By: Trudie Reed M.D.   On: 07/03/2017 21:11    Anti-infectives: Anti-infectives (From admission, onward)   Start     Dose/Rate Route Frequency Ordered Stop   07/04/17 0400  piperacillin-tazobactam (ZOSYN) IVPB 3.375 g     3.375 g 12.5 mL/hr over 240 Minutes Intravenous Every 8 hours 07/04/17 0034     07/03/17 2145  piperacillin-tazobactam (ZOSYN) IVPB 3.375 g     3.375 g 100 mL/hr over 30 Minutes Intravenous  Once 07/03/17 2143 07/03/17 2228      Assessment/Plan: s/p Procedure(s): ENDOSCOPIC RETROGRADE CHOLANGIOPANCREATOGRAPHY (ERCP) Contingent upon no furthe evidence of acute pancreatitis, will go ahead with laparoscopic cholecystectomy with IOC.  If CBD is clear, patient can go home tomorrow.  If not will need transfer for ERCP  LOS: 2 days   Marta Lamas. Gae Bon, MD, FACS [phone]  [phone]--office [facility] 07/05/2017

## 2017-07-05 NOTE — Progress Notes (Signed)
[place] [facility] Progress Note  Chief Complaint: Right upper quadrant pain and jaundice  History:  Alex Potter continues to have upper abdominal pain today, unchanged from yesterday. He is also hungry but nothing by mouth for planned cholecystectomy today.  ROS: Cardiovascular:  no chest pain Respiratory: no dyspnea  Objective:  Med list reviewed  Vital signs in last 24 hrs: Vitals:   07/04/17 2128 07/05/17 0504  BP: (!) 108/59 102/64  Pulse: 66 67  Resp: 16 16  Temp: 98.5 F (36.9 C) 97.9 F (36.6 C)  SpO2: 98% 99%    Physical Exam He is comfortable and well-appearing, testing on his phone with his mom at the bedside  HEENT: sclera mildly icteric, oral mucosa moist without lesions  Neck: supple, no thyromegaly, JVD or lymphadenopathy  Cardiac: RRR without murmurs, S1S2 heard, no peripheral edema  Pulm: clear to auscultation bilaterally, normal RR and effort noted  Abdomen: soft, mild RUQ tenderness, with active bowel sounds. No guarding or palpable hepatosplenomegaly  Skin; warm and dry, + jaundice , no rash  Recent Labs:  Recent Labs  Lab 07/03/17 1500  WBC 6.6  HGB 14.0  HCT 43.1  PLT 284   Recent Labs  Lab 07/05/17 0700  NA 142  K 3.9  CL 107  CO2 22  BUN 6  ALBUMIN 2.8*  ALKPHOS 267*  ALT 227*  AST 139*  GLUCOSE 94   Total bilirubin 4.8  LFTs are very similar to yesterday  Lipase 53 this morning  Recent Labs  Lab 07/03/17 1447  INR 1.00    @ASSESSMENTPLANBEGIN @ Assessment:  Right upper quadrant pain Acute cholecystitis Jaundice Biliary ductal dilatation with reported choledocholithiasis on imaging  He continues to have pain that I think is a combination of cholecystitis and choledocholithiasis.  He had biliary pancreatitis on admission, thankfully it did not worsen after difficult ERCP yesterday.  Plan:  I discussed the case with Dr. Lindie SpruceWyatt of surgery last evening and again this morning. He plans to do a laparoscopic cholecystectomy  with IOC today. I am in agreement with that plan, and suspected stone will be found on IOC. He causes of that, I have already contacted advanced biliary endoscopy colleagues at [facility] trying to make arrangements for a repeat ERCP for stone extraction. It is a logistical challenge with no available inpatient bed at that facility and the weekend approaching. However, it might be feasible for him to have something like a medical procedure day trip they're on Monday if stone is found on IOC. I discussed this with Dr. Lindie SpruceWyatt and also the patient and his mother. There is some risk of a failed second ERCP requiring operative intervention for common duct exploration after already having had a cholecystectomy, but I think this risk is low given the experience of success by our advanced to the area endoscopy colleagues.  Total time 40 minutes, over half spent in discussion with patient and family and surgical team.  Charlie PitterHenry L Danis III Pager [phone] Mon-Fri 8a-5p [phone] after 5p, weekends, holidays

## 2017-07-05 NOTE — Progress Notes (Signed)
PROGRESS NOTE    Alex LiasJames Blunck  ZOX:[id number] DOB: [birth year] DOA: 07/03/2017 PCP: Patient, No Pcp Per   Brief Narrative: Alex Potter is a 22 y.o. male with medical history significant of drug abuse, tobacco abuse, alcohol abuse, depression. He presented secondary to right upper quadrant pain and found to have cholecystitis and CBD dilation.   Assessment & Plan:   Principal Problem:   Right upper quadrant abdominal pain Active Problems:   Acute cholecystitis   Gallstone   Tobacco abuse   Drug abuse (HCC)   Cholecystitis   Abnormal liver function   Depression   Acute cholecystitis Still with some right sided pain. -Pain management -Antibiotics -Surgery recommendations: Lap chole today  CBD Abnormal liver function Hyperbilirubinemia Patient failed ERCP. If CBD stone likely present[facility]  Tobacco abuse Polysubstance abuse [facility]ed. [facility]  Depression Stable -Continue Cymbalta   DVT prophylaxis: SCDs Code Status:   Code Status: Full Code Family Communication: Mother at bedside Disposition Plan: Pending general surgery and GI   Consultants:   General surgery  Gastroenterology  Procedures:   ERCP (07/04/2017)  Antimicrobials:  Zosyn    Subjective: RUQ pain persistent. Hungry.  Objective: Vitals:   07/04/17 1515 07/04/17 1821 07/04/17 2128 07/05/17 0504  BP:  124/70 (!) 108/59 102/64  Pulse: 68 66 66 67  Resp: 14 16 16 16   Temp:  98.2 F (36.8 C) 98.5 F (36.9 C) 97.9 F (36.6 C)  TempSrc:  Oral Oral Oral  SpO2: 99% 97% 98% 99%  Weight:      Height:        Intake/Output Summary (Last 24 hours) at 07/05/2017 0951 Last data filed at 07/04/2017 1819 Gross per 24 hour  Intake 980 ml  Output 2 ml  Net 978 ml   Filed Weights   07/04/17 0130 07/04/17 1220  Weight: 74 kg (163 lb 2.3 oz) 74 kg (163 lb 2.3 oz)    Examination:  General exam: Appears calm and  comfortable HEENT: mild scleral icterus Respiratory system: Clear to auscultation. Respiratory effort normal. Cardiovascular system: S1 & S2 heard, RRR. No murmurs, rubs, gallops or clicks. Gastrointestinal system: Soft, non-tender, non-distended, no guarding, no rebound, no masses felt Central nervous system: Alert and oriented. No focal neurological deficits. Extremities: No edema. No calf tenderness Skin: No cyanosis. No rashes. Jaundice of forehead Psychiatry: Judgement and insight appear normal. Mood & affect appropriate.     Data Reviewed: I have personally reviewed following labs and imaging studies  CBC: Recent Labs  Lab 07/03/17 1500  WBC 6.6  HGB 14.0  HCT 43.1  MCV 79.5  PLT 284   Basic Metabolic Panel: Recent Labs  Lab 07/03/17 1500 07/04/17 0621 07/05/17 0700  NA 139 138 142  K 4.3 3.8 3.9  CL 104 106 107  CO2 25 22 22   GLUCOSE 114* 91 94  BUN 7 7 6   CREATININE 0.91 1.00 0.91  CALCIUM 9.6 8.8* 8.9   GFR: Estimated Creatinine Clearance: 120.8 mL/min (by C-G formula based on SCr of 0.91 mg/dL). Liver Function Tests: Recent Labs  Lab 07/03/17 1500 07/04/17 0621 07/05/17 0700  AST 158* 133* 139*  ALT 288* 239* 227*  ALKPHOS 277* 260* 267*  BILITOT 5.8* 5.2* 4.8*  PROT 7.6 6.2* 5.8*  ALBUMIN 3.8 3.1* 2.8*   Recent Labs  Lab 07/03/17 1500 07/04/17 0621 07/05/17 0700  LIPASE 279* 163* 53*   Recent Labs  Lab 07/03/17 2149  AMMONIA 42*   Coagulation Profile: Recent Labs  Lab 07/03/17 1447  INR 1.00   Cardiac Enzymes: No results for input(s): CKTOTAL, CKMB, CKMBINDEX, TROPONINI in the last 168 hours. BNP (last 3 results) No results for input(s): PROBNP in the last 8760 hours. HbA1C: No results for input(s): HGBA1C in the last 72 hours. CBG: Recent Labs  Lab 07/04/17 0758 07/05/17 0720  GLUCAP 76 85   Lipid Profile: No results for input(s): CHOL, HDL, LDLCALC, TRIG, CHOLHDL, LDLDIRECT in the last 72 hours. Thyroid Function  Tests: No results for input(s): TSH, T4TOTAL, FREET4, T3FREE, THYROIDAB in the last 72 hours. Anemia Panel: No results for input(s): VITAMINB12, FOLATE, FERRITIN, TIBC, IRON, RETICCTPCT in the last 72 hours. Sepsis Labs: No results for input(s): PROCALCITON, LATICACIDVEN in the last 168 hours.  Recent Results (from the past 240 hour(s))  MRSA PCR Screening     Status: None   Collection Time: 07/04/17  1:42 AM  Result Value Ref Range Status   MRSA by PCR NEGATIVE NEGATIVE Final    Comment:        The GeneXpert MRSA Assay (FDA approved for NASAL specimens only), is one component of a comprehensive MRSA colonization surveillance program. It is not intended to diagnose MRSA infection nor to guide or monitor treatment for MRSA infections. Performed at [facility], [address] [place], [place] [address]          Radiology Studies: Dg C-arm 1-60 Min-no Report  Result Date: 07/04/2017 Fluoroscopy was utilized by the requesting physician.  No radiographic interpretation.   US Abdomen Limited Ruq  Result Date: 07/03/2017 CLINICAL DATA:  22 year old male with history of right upper quadrant abdominal pain and jaundice for the past 2 weeks. EXAM: ULTRASOUND ABDOMEN LIMITED RIGHT UPPER QUADRANT COMPARISON:  None. FINDINGS: Gallbladder: Large amount of biliary sludge and multiple small gallstones filling the lumen of the gallbladder. Gallbladder is moderately distended. Gallbladder wall is thickened measuring 7.4 mm. No definite pericholecystic fluid. However, per report, sonographic Murphy sign was positive. Common bile duct: Diameter: 10.4 mm in the porta hepatis. Low level echogenicity (without shadowing) in the common bile duct could indicate the presence of intraductal sludge. Liver: No focal lesion identified. There appears to be some mild intrahepatic biliary ductal dilatation. Within normal limits in parenchymal echogenicity. Portal vein is patent on color Doppler imaging  with normal direction of blood flow towards the liver. IMPRESSION: 1. Biliary sludge and cholelithiasis with evidence suggestive of acute cholecystitis, as well as common bile duct dilatation and mild intrahepatic biliary ductal dilatation, concerning for ductal obstruction (potentially related to intraductal sludge). Surgical consultation is recommended. Electronically Signed   By: Trudie Reed M.D.   On: 07/03/2017 21:11        Scheduled Meds: . DULoxetine  30 mg Oral Daily  . Influenza vac split quadrivalent PF  0.5 mL Intramuscular Tomorrow-1000  . loratadine  10 mg Oral Daily  . multivitamin with minerals  1 tablet Oral Daily  . nicotine  21 mg Transdermal Daily  . pneumococcal 23 valent vaccine  0.5 mL Intramuscular Tomorrow-1000   Continuous Infusions: . sodium chloride 125 mL/hr at 07/05/17 0806  . piperacillin-tazobactam (ZOSYN)  IV Stopped (07/05/17 0857)     LOS: 2 days     Jacquelin Hawking, MD [facility] 07/05/2017, 9:51 AM Pager: [phone]  If 7PM-7AM, please contact night-coverage [email or web address] Password [facility] 07/05/2017, 9:51 AM

## 2017-07-05 NOTE — Anesthesia Preprocedure Evaluation (Signed)
Anesthesia Evaluation  Patient identified by MRN, date of birth, ID band Patient awake    Reviewed: Allergy & Precautions, NPO status , Patient's Chart, lab work & pertinent test results  Airway Mallampati: II  TM Distance: >3 FB     Dental   Pulmonary neg pulmonary ROS, Current Smoker,    breath sounds clear to auscultation       Cardiovascular negative cardio ROS   Rhythm:Regular Rate:Normal     Neuro/Psych    GI/Hepatic Neg liver ROS,   Endo/Other  negative endocrine ROS  Renal/GU negative Renal ROS     Musculoskeletal   Abdominal   Peds  Hematology   Anesthesia Other Findings   Reproductive/Obstetrics                             Anesthesia Physical Anesthesia Plan  ASA: III  Anesthesia Plan: General   Post-op Pain Management:    Induction: Intravenous  PONV Risk Score and Plan: 1 and Treatment may vary due to age or medical condition, Ondansetron, Dexamethasone and Midazolam  Airway Management Planned: Oral ETT  Additional Equipment:   Intra-op Plan:   Post-operative Plan:   Informed Consent: I have reviewed the patients History and Physical, chart, labs and discussed the procedure including the risks, benefits and alternatives for the proposed anesthesia with the patient or authorized representative who has indicated his/her understanding and acceptance.   Dental advisory given  Plan Discussed with: CRNA and Anesthesiologist  Anesthesia Plan Comments:         Anesthesia Quick Evaluation

## 2017-07-05 NOTE — Anesthesia Procedure Notes (Signed)
Procedure Name: Intubation Date/Time: 07/05/2017 2:16 PM Performed by: Elliot DallyHuggins, Ashley Bultema, CRNA Pre-anesthesia Checklist: Patient identified, Emergency Drugs available, Suction available and Patient being monitored Patient Re-evaluated:Patient Re-evaluated prior to induction Oxygen Delivery Method: Circle System Utilized Preoxygenation: Pre-oxygenation with 100% oxygen Induction Type: IV induction Ventilation: Mask ventilation without difficulty Laryngoscope Size: Miller and 2 Grade View: Grade I Tube type: Oral Tube size: 7.5 mm Number of attempts: 1 Airway Equipment and Method: Stylet and Oral airway Placement Confirmation: ETT inserted through vocal cords under direct vision,  positive ETCO2 and breath sounds checked- equal and bilateral Secured at: 21 cm Tube secured with: Tape Dental Injury: Teeth and Oropharynx as per pre-operative assessment

## 2017-07-05 NOTE — Anesthesia Postprocedure Evaluation (Signed)
Anesthesia Post Note  Patient: Roque LiasJames Darr  Procedure(s) Performed: LAPAROSCOPIC CHOLECYSTECTOMY WITH INTRAOPERATIVE CHOLANGIOGRAM (N/A Abdomen)     Patient location during evaluation: PACU Anesthesia Type: General Level of consciousness: awake and alert Pain management: pain level controlled Vital Signs Assessment: post-procedure vital signs reviewed and stable Respiratory status: spontaneous breathing, nonlabored ventilation, respiratory function stable and patient connected to nasal cannula oxygen Cardiovascular status: blood pressure returned to baseline and stable Postop Assessment: no apparent nausea or vomiting Anesthetic complications: no    Last Vitals:  Vitals:   07/05/17 1610 07/05/17 1625  BP: 126/86 121/76  Pulse: 64 73  Resp: 19 20  Temp:    SpO2: 100% 100%    Last Pain:  Vitals:   07/05/17 0823  TempSrc:   PainSc: 5                  Phillips Groutarignan, Jim Philemon

## 2017-07-05 NOTE — Progress Notes (Addendum)
Received a call from [place] RN at [facility] that patient needs to be at [facility] on Monday, 07/08/17 before 9:00 am for his ERCP. Per RN, [facility] needs [facility] establish [facility] round trip transfer.

## 2017-07-05 NOTE — Op Note (Addendum)
OPERATIVE REPORT  DATE OF OPERATION: 07/05/2017  PATIENT:  Alex Potter  22 y.o. male  PRE-OPERATIVE DIAGNOSIS:  Cholelithiasis, choledocholithiasis, dilated CBD, acute cholecystitis  POST-OPERATIVE DIAGNOSIS:  Cholelithiasis, choledocholithiasis, dilated CBD, acute cholecystitis  INDICATION(S) FOR OPERATION:  Patient admitted with abnormal LFTs including elevated Tbili, failed ERCP, normal Lipase.  Likely CBD stones  FINDINGS:  Blocked and dilated CBD.  PROCEDURE:  Procedure(s): LAPAROSCOPIC CHOLECYSTECTOMY WITH INTRAOPERATIVE CHOLANGIOGRAM  SURGEON:  Surgeon(s): Violeta Gelinas, MD Jimmye Norman, MD  ASSISTANT: Janee Morn, MD  ANESTHESIA:   general  COMPLICATIONS:  None  EBL: 75ml  BLOOD ADMINISTERED: none  DRAINS: none   SPECIMEN:  Source of Specimen:  Gallbladder and contents  COUNTS CORRECT:  YES  PROCEDURE DETAILS: The patient was taken to the operating room and placed on the table in the supine position.  After an adequate endotracheal anesthetic was administered, the patient was prepped with ChloroPrep, and then draped in the usual manner exposing the entire abdomen laterally, inferiorly and up  to the costal margins.  After a proper timeout was performed including identifying the patient and the procedure to be performed, a supraumbilical 1.5cm midline incision was made using a #15 blade.  This was taken down to the fascia which was then incised with a #15 blade.  The edges of the fascia were tented up with Kocher clamps as the preperitoneal space was penetrated with a Kelly clamp into the peritoneum.  Once this was done, a pursestring suture of 0 Vicryl was passed around the fascial opening.  This was subsequently used to secure the [facility] cannula which was passed into the peritoneal cavity.  Once the [facility] cannula was in place, carbon dioxide gas was insufflated into the peritoneal cavity up to a maximal intra-abdominal pressure of 15mm Hg.The laparoscope, with  attached camera and light source, was passed into the peritoneal cavity to visualize the direct insertion of two right upper quadrant 5mm cannulas, and a sup-xiphoid 5mm cannula.  Once all cannulas were in place, the dissection was begun.  Two ratcheted graspers were attached to the dome and infundibulum of the gallbladder and retracted towards the anterior abdominal wall and the right upper quadrant.  Using cautery attached to a dissecting forceps, the peritoneum overlaying the triangle of Chalot and the hepatoduodenal triangle was dissected away exposing the cystic duct and the cystic artery.  A critical window was developed between the CBD and the cystic duct The cystic artery was clipped proximally and distally then transected.  A clip was placed on the gallbladder side of the cystic duct, then a cholecystodochotomy made using the laparoscopic scissors.  Through the cholecystodochotomy a Cook catheter was passed to performed a cholangiogram.  The cholangiogram showed markedly dilated CBD, good proximal filling, questionable intraductal filling defects, no flow into the very distal CBD or duodenum.  Once the cholangiogram was completed, the [facility] catheter was removed, and the distal cystic duct was clipped four times then transected between the clips.  Four clips were left on the CBD side.  The gallbladder was then dissected out of the hepatic bed with some bleeding from acute cholecystitis requiring electrocautery ablation and two pieces of SurgiCel Snow.  It was retrieved from the abdomen using an EcoPouch bag without event.  Once the gallbladder was removed, the bed was inspected for hemostasis.  Once excellent hemostasis was obtained all gas and fluids were aspirated from above the liver, then the cannulas were removed.  The supraumbilical incision was closed using the pursestring suture which  was in place.  0.25% bupivicaine with epinephrine was injected at all sites.  All 10mm or greater cannula  sites were close using a running subcuticular stitch of 4-0 Monocryl.  5.350mm cannula sites were closed with Dermabond only.Steri-Strips and Tagaderm were used to complete the dressings at all sites.  At this point all needle, sponge, and instrument counts were correct.The patient was awakened from anesthesia and taken to the PACU in stable condition.      Marta LamasJames O. Gae BonWyatt, III, MD, FACS [phone]--pager [phone]--office [facility]  PATIENT DISPOSITION:  PACU - hemodynamically stable.   Jimmye NormanJames Hien Cunliffe 2/22/20193:48 PM

## 2017-07-06 ENCOUNTER — Encounter (HOSPITAL_COMMUNITY): Payer: Self-pay | Admitting: General Surgery

## 2017-07-06 DIAGNOSIS — K8001 Calculus of gallbladder with acute cholecystitis with obstruction: Secondary | ICD-10-CM

## 2017-07-06 DIAGNOSIS — K8043 Calculus of bile duct with acute cholecystitis with obstruction: Secondary | ICD-10-CM

## 2017-07-06 DIAGNOSIS — K819 Cholecystitis, unspecified: Secondary | ICD-10-CM

## 2017-07-06 LAB — COMPREHENSIVE METABOLIC PANEL
ALBUMIN: 2.7 g/dL — AB (ref 3.5–5.0)
ALT: 255 U/L — AB (ref 17–63)
AST: 180 U/L — ABNORMAL HIGH (ref 15–41)
Alkaline Phosphatase: 282 U/L — ABNORMAL HIGH (ref 38–126)
Anion gap: 12 (ref 5–15)
BUN: 6 mg/dL (ref 6–20)
CHLORIDE: 103 mmol/L (ref 101–111)
CO2: 26 mmol/L (ref 22–32)
CREATININE: 0.93 mg/dL (ref 0.61–1.24)
Calcium: 8.5 mg/dL — ABNORMAL LOW (ref 8.9–10.3)
GFR calc Af Amer: >60 mL/min (ref >60)
GLUCOSE: 90 mg/dL (ref 65–99)
POTASSIUM: 3.8 mmol/L (ref 3.5–5.1)
SODIUM: 141 mmol/L (ref 135–145)
Total Bilirubin: 4.7 mg/dL — ABNORMAL HIGH (ref 0.3–1.2)
Total Protein: 5.4 g/dL — ABNORMAL LOW (ref 6.5–8.1)

## 2017-07-06 LAB — CBC WITH DIFFERENTIAL/PLATELET
Basophils Absolute: 0 10*3/uL (ref 0.0–0.1)
Basophils Relative: 0 %
EOS ABS: 0 10*3/uL (ref 0.0–0.7)
EOS PCT: 0 %
HCT: 34.6 % — ABNORMAL LOW (ref 39.0–52.0)
Hemoglobin: 11 g/dL — ABNORMAL LOW (ref 13.0–17.0)
LYMPHS PCT: 25 %
Lymphs Abs: 1.6 10*3/uL (ref 0.7–4.0)
MCH: 25.2 pg — AB (ref 26.0–34.0)
MCHC: 31.8 g/dL (ref 30.0–36.0)
MCV: 79.2 fL (ref 78.0–100.0)
MONOS PCT: 10 %
Monocytes Absolute: 0.6 10*3/uL (ref 0.1–1.0)
Neutro Abs: 4.2 10*3/uL (ref 1.7–7.7)
Neutrophils Relative %: 65 %
PLATELETS: 272 10*3/uL (ref 150–400)
RBC: 4.37 MIL/uL (ref 4.22–5.81)
RDW: 16 % — ABNORMAL HIGH (ref 11.5–15.5)
WBC: 6.4 10*3/uL (ref 4.0–10.5)

## 2017-07-06 LAB — GLUCOSE, CAPILLARY: GLUCOSE-CAPILLARY: 91 mg/dL (ref 65–99)

## 2017-07-06 NOTE — Progress Notes (Signed)
PROGRESS NOTE    Alex Potter  ZOX:[id number] DOB: November 02, 1995 DOA: 07/03/2017 PCP: Patient, No Pcp Per   Brief Narrative: Alex Potter is a 22 y.o. male with medical history significant of drug abuse, tobacco abuse, alcohol abuse, depression. He presented secondary to right upper quadrant pain and found to have cholecystitis and CBD dilation.   Assessment & Plan:   Principal Problem:   Right upper quadrant abdominal pain Active Problems:   Acute cholecystitis   Gallstone   Tobacco abuse   Drug abuse (HCC)   Cholecystitis   Abnormal liver function   Depression   Acute cholecystitis POD #1 from cholecystectomy -Pain management per [facility] -Antibiotics  CBD Abnormal liver function Hyperbilirubinemia Patient failed ERCP. Plan for transfer to [facility] for ERCP on 2/25  Tobacco abuse Polysubstance abuse Counseled. [facility]  Depression Stable -Continue Cymbalta   DVT prophylaxis: SCDs Code Status:   Code Status: Full Code Family Communication: Mother at bedside Disposition Plan: Pending general surgery and GI   Consultants:   General surgery  Gastroenterology  Procedures:   ERCP (07/04/2017)  Antimicrobials:  Zosyn    Subjective: Some abdominal pain  Objective: Vitals:   07/05/17 1719 07/05/17 2058 07/06/17 0608 07/06/17 1300  BP: 117/61 124/68 114/63 (!) 121/55  Pulse: 60 76 (!) 54 83  Resp: 18 18 18 18   Temp: 98 F (36.7 C) 98.5 F (36.9 C) 98.2 F (36.8 C) 98.5 F (36.9 C)  TempSrc: Oral Oral Oral Oral  SpO2: 95% 97% 96% 95%  Weight:      Height:        Intake/Output Summary (Last 24 hours) at 07/06/2017 1416 Last data filed at 07/06/2017 8119 Gross per 24 hour  Intake 1695 ml  Output 2150 ml  Net -455 ml   Filed Weights   07/04/17 0130 07/04/17 1220  Weight: 74 kg (163 lb 2.3 oz) 74 kg (163 lb 2.3 oz)    Examination:  General exam: Appears calm and comfortable Respiratory system: Clear to auscultation. Respiratory  effort normal. Cardiovascular system: Regular rate and rhythm. Normal S1 and S2. No heart murmurs present. No extra heart sounds Gastrointestinal system: Soft, general tenderness, non-distended, no guarding, no rebound, no masses felt Central nervous system: Alert and oriented. No focal neurological deficits. Skin: No cyanosis. No rashes. Jaundice of forehead Psychiatry: Judgement and insight appear normal. Mood & affect appropriate.     Data Reviewed: I have personally reviewed following labs and imaging studies  CBC: Recent Labs  Lab 07/03/17 1500 07/05/17 0700 07/06/17 0556  WBC 6.6 6.2 6.4  NEUTROABS  --  3.9 4.2  HGB 14.0 11.7* 11.0*  HCT 43.1 37.0* 34.6*  MCV 79.5 79.4 79.2  PLT 284 269 272   Basic Metabolic Panel: Recent Labs  Lab 07/03/17 1500 07/04/17 0621 07/05/17 0700 07/06/17 0556  NA 139 138 142 141  K 4.3 3.8 3.9 3.8  CL 104 106 107 103  CO2 25 22 22 26   GLUCOSE 114* 91 94 90  BUN 7 7 6 6   CREATININE 0.91 1.00 0.91 0.93  CALCIUM 9.6 8.8* 8.9 8.5*   GFR: Estimated Creatinine Clearance: 118.2 mL/min (by C-G formula based on SCr of 0.93 mg/dL). Liver Function Tests: Recent Labs  Lab 07/03/17 1500 07/04/17 0621 07/05/17 0700 07/06/17 0556  AST 158* 133* 139* 180*  ALT 288* 239* 227* 255*  ALKPHOS 277* 260* 267* 282*  BILITOT 5.8* 5.2* 4.8* 4.7*  PROT 7.6 6.2* 5.8* 5.4*  ALBUMIN 3.8 3.1*  2.8* 2.7*   Recent Labs  Lab 07/03/17 1500 07/04/17 0621 07/05/17 0700  LIPASE 279* 163* 53*   Recent Labs  Lab 07/03/17 2149  AMMONIA 42*   Coagulation Profile: Recent Labs  Lab 07/03/17 1447  INR 1.00   Cardiac Enzymes: No results for input(s): CKTOTAL, CKMB, CKMBINDEX, TROPONINI in the last 168 hours. BNP (last 3 results) No results for input(s): PROBNP in the last 8760 hours. HbA1C: No results for input(s): HGBA1C in the last 72 hours. CBG: Recent Labs  Lab 07/04/17 0758 07/05/17 0720 07/06/17 0833  GLUCAP 76 85 91   Lipid  Profile: No results for input(s): CHOL, HDL, LDLCALC, TRIG, CHOLHDL, LDLDIRECT in the last 72 hours. Thyroid Function Tests: No results for input(s): TSH, T4TOTAL, FREET4, T3FREE, THYROIDAB in the last 72 hours. Anemia Panel: No results for input(s): VITAMINB12, FOLATE, FERRITIN, TIBC, IRON, RETICCTPCT in the last 72 hours. Sepsis Labs: No results for input(s): PROCALCITON, LATICACIDVEN in the last 168 hours.  Recent Results (from the past 240 hour(s))  Culture, blood (Routine X 2) w Reflex to ID Panel     Status: None (Preliminary result)   Collection Time: 07/04/17 12:10 AM  Result Value Ref Range Status   Specimen Description BLOOD RIGHT ANTECUBITAL  Final   Special Requests   Final    BOTTLES DRAWN AEROBIC AND ANAEROBIC Blood Culture adequate volume   Culture   Final    NO GROWTH 1 DAY Performed at [facility], [address], [place], [place] [address]    Report Status PENDING  Incomplete  Culture, blood (Routine X 2) w Reflex to ID Panel     Status: None (Preliminary result)   Collection Time: 07/04/17 12:15 AM  Result Value Ref Range Status   Specimen Description BLOOD RIGHT HAND  Final   Special Requests   Final    BOTTLES DRAWN AEROBIC AND ANAEROBIC Blood Culture adequate volume   Culture   Final    NO GROWTH 1 DAY Performed at [facility], [address], [place], [place] [address]    Report Status PENDING  Incomplete  MRSA PCR Screening     Status: None   Collection Time: 07/04/17  1:42 AM  Result Value Ref Range Status   MRSA by PCR NEGATIVE NEGATIVE Final    Comment:        The GeneXpert MRSA Assay (FDA approved for NASAL specimens only), is one component of a comprehensive MRSA colonization surveillance program. It is not intended to diagnose MRSA infection nor to guide or monitor treatment for MRSA infections. Performed at [facility], [address], [place], [place] [address]          Radiology Studies: Dg Cholangiogram  Operative  Result Date: 07/05/2017 CLINICAL DATA:  Cholelithiasis.  Attempted ERCP on 07/04/2016 EXAM: INTRAOPERATIVE CHOLANGIOGRAM TECHNIQUE: Cholangiographic images from the C-arm fluoroscopic device were submitted for interpretation post-operatively. Please see the procedural report for the amount of contrast and the fluoroscopy time utilized. COMPARISON:  Ultrasound 07/03/2017 FINDINGS: The common hepatic duct and central intrahepatic bile ducts are dilated. There is minor filling of the common bile duct. No filling of the duodenum. IMPRESSION: Dilated biliary system and concern for distal biliary obstruction. Electronically Signed   By: Richarda Overlie M.D.   On: 07/05/2017 15:31   Dg C-arm 1-60 Min-no Report  Result Date: 07/04/2017 Fluoroscopy was utilized by the requesting physician.  No radiographic interpretation.        Scheduled  Meds: . DULoxetine  30 mg Oral Daily  . loratadine  10 mg Oral Daily  . multivitamin with minerals  1 tablet Oral Daily  . nicotine  21 mg Transdermal Daily   Continuous Infusions: . sodium chloride 125 mL/hr at 07/06/17 1143  . lactated ringers 10 mL/hr at 07/05/17 1322  . piperacillin-tazobactam (ZOSYN)  IV 3.375 g (07/06/17 1144)     LOS: 3 days     Jacquelin Hawkingalph Fawne Hughley, MD [facility] 07/06/2017, 2:16 PM Pager: [phone]  If 7PM-7AM, please contact night-coverage [email or web address] Password [email or web address] 07/06/2017, 2:16 PM

## 2017-07-06 NOTE — Progress Notes (Signed)
[facility] ROUNDING NOTE   Subjective: S/p cholecystectomy yesterday. Mild RUQ discomfort, says better compared to prior to surgery. No flow of contrast through distal CBD into small bowel during IOC.  Tolerating diet.  LFT remain elevated  Objective: Vital signs in last 24 hours: Temp:  [97.8 F (36.6 C)-98.5 F (36.9 C)] 98.2 F (36.8 C) (02/23 16100608) Pulse Rate:  [54-76] 54 (02/23 0608) Resp:  [12-20] 18 (02/23 0608) BP: (114-130)/(61-86) 114/63 (02/23 0608) SpO2:  [92 %-100 %] 96 % (02/23 0608) Last BM Date: 07/04/17 General: NAD Abdomen:Mild RUQ tenderness with mild distension, no rebound. Ext: No edema    Intake/Output from previous day: 02/22 0701 - 02/23 0700 In: 1695 [P.O.:445; I.V.:1200] Out: 2150 [Urine:2100; Blood:50] Intake/Output this shift: No intake/output data recorded.   Lab Results: Recent Labs    07/03/17 1500 07/05/17 0700 07/06/17 0556  WBC 6.6 6.2 6.4  HGB 14.0 11.7* 11.0*  PLT 284 269 272  MCV 79.5 79.4 79.2   BMET Recent Labs    07/04/17 0621 07/05/17 0700 07/06/17 0556  NA 138 142 141  K 3.8 3.9 3.8  CL 106 107 103  CO2 22 22 26   GLUCOSE 91 94 90  BUN 7 6 6   CREATININE 1.00 0.91 0.93  CALCIUM 8.8* 8.9 8.5*   LFT Recent Labs    07/04/17 0621 07/05/17 0700 07/06/17 0556  PROT 6.2* 5.8* 5.4*  ALBUMIN 3.1* 2.8* 2.7*  AST 133* 139* 180*  ALT 239* 227* 255*  ALKPHOS 260* 267* 282*  BILITOT 5.2* 4.8* 4.7*   PT/INR Recent Labs    07/03/17 1447  INR 1.00      Imaging/Other results: Dg Cholangiogram Operative  Result Date: 07/05/2017 CLINICAL DATA:  Cholelithiasis.  Attempted ERCP on 07/04/2016 EXAM: INTRAOPERATIVE CHOLANGIOGRAM TECHNIQUE: Cholangiographic images from the C-arm fluoroscopic device were submitted for interpretation post-operatively. Please see the procedural report for the amount of contrast and the fluoroscopy time utilized. COMPARISON:  Ultrasound 07/03/2017 FINDINGS: The common hepatic  duct and central intrahepatic bile ducts are dilated. There is minor filling of the common bile duct. No filling of the duodenum. IMPRESSION: Dilated biliary system and concern for distal biliary obstruction. Electronically Signed   By: Richarda OverlieAdam  Henn M.D.   On: 07/05/2017 15:31   Dg C-arm 1-60 Min-no Report  Result Date: 07/04/2017 Fluoroscopy was utilized by the requesting physician.  No radiographic interpretation.      Assessment &Plan 21 yr M with acute gallstone cholecystitis, choledocholithiasis, ERCP unsuccessful to cannulate and extract CBD stone S/p lap Chole 07/05/17 with IOC suggestive of retained CBD stone with persistent CBD obstruction Plan for [facility] morning and he will return after the procedure NPO after midnight tomorrow Continue Zosyn Diet as tolerated Please call with any questions   K. Scherry RanVeena Janelis Stelzer , MD [phone] Mon-Fri 8a-5p [phone] after 5p, weekends, holidays [facility]

## 2017-07-06 NOTE — Progress Notes (Signed)
[facility] Progress Note  1 Day Post-Op  Subjective: CC: abdominal soreness Patient complaining of mild soreness in abdomen, but pain is well controlled. Some decreased appetite but tolerating regular food. Passing flatus. Has not been out of bed yet. VSS.  Objective: Vital signs in last 24 hours: Temp:  [97.8 F (36.6 C)-98.5 F (36.9 C)] 98.2 F (36.8 C) (02/23 0608) Pulse Rate:  [54-76] 54 (02/23 0608) Resp:  [12-20] 18 (02/23 0608) BP: (114-130)/(61-86) 114/63 (02/23 0608) SpO2:  [92 %-100 %] 96 % (02/23 0608) Last BM Date: 07/04/17  Intake/Output from previous day: 02/22 0701 - 02/23 0700 In: 1695 [P.O.:445; I.V.:1200] Out: 2150 [Urine:2100; Blood:50] Intake/Output this shift: No intake/output data recorded.  PE: Gen:  Alert, NAD, pleasant Card:  Regular rate and rhythm, pedal pulses 2+ BL Pulm:  Normal effort, clear to auscultation bilaterally Abd: Soft, minimally tender, non-distended, bowel sounds present, no HSM, incisions C/D/I Skin: warm and dry, no rashes  Psych: A&Ox3   Lab Results:  Recent Labs    07/05/17 0700 07/06/17 0556  WBC 6.2 6.4  HGB 11.7* 11.0*  HCT 37.0* 34.6*  PLT 269 272   BMET Recent Labs    07/05/17 0700 07/06/17 0556  NA 142 141  K 3.9 3.8  CL 107 103  CO2 22 26  GLUCOSE 94 90  BUN 6 6  CREATININE 0.91 0.93  CALCIUM 8.9 8.5*   PT/INR Recent Labs    07/03/17 1447  LABPROT 13.1  INR 1.00   CMP     Component Value Date/Time   NA 141 07/06/2017 0556   K 3.8 07/06/2017 0556   CL 103 07/06/2017 0556   CO2 26 07/06/2017 0556   GLUCOSE 90 07/06/2017 0556   BUN 6 07/06/2017 0556   CREATININE 0.93 07/06/2017 0556   CALCIUM 8.5 (L) 07/06/2017 0556   PROT 5.4 (L) 07/06/2017 0556   ALBUMIN 2.7 (L) 07/06/2017 0556   AST 180 (H) 07/06/2017 0556   ALT 255 (H) 07/06/2017 0556   ALKPHOS 282 (H) 07/06/2017 0556   BILITOT 4.7 (H) 07/06/2017 0556   GFRNONAA >60 07/06/2017 0556   GFRAA >60 07/06/2017 0556    Lipase     Component Value Date/Time   LIPASE 53 (H) 07/05/2017 0700       Studies/Results: Dg Cholangiogram Operative  Result Date: 07/05/2017 CLINICAL DATA:  Cholelithiasis.  Attempted ERCP on 07/04/2016 EXAM: INTRAOPERATIVE CHOLANGIOGRAM TECHNIQUE: Cholangiographic images from the C-arm fluoroscopic device were submitted for interpretation post-operatively. Please see the procedural report for the amount of contrast and the fluoroscopy time utilized. COMPARISON:  Ultrasound 07/03/2017 FINDINGS: The common hepatic duct and central intrahepatic bile ducts are dilated. There is minor filling of the common bile duct. No filling of the duodenum. IMPRESSION: Dilated biliary system and concern for distal biliary obstruction. Electronically Signed   By: Richarda OverlieAdam  Henn M.D.   On: 07/05/2017 15:31   Dg C-arm 1-60 Min-no Report  Result Date: 07/04/2017 Fluoroscopy was utilized by the requesting physician.  No radiographic interpretation.    Anti-infectives: Anti-infectives (From admission, onward)   Start     Dose/Rate Route Frequency Ordered Stop   07/04/17 0400  piperacillin-tazobactam (ZOSYN) IVPB 3.375 g     3.375 g 12.5 mL/hr over 240 Minutes Intravenous Every 8 hours 07/04/17 0034     07/03/17 2145  piperacillin-tazobactam (ZOSYN) IVPB 3.375 g     3.375 g 100 mL/hr over 30 Minutes Intravenous  Once 07/03/17 2143 07/03/17 2228  Assessment/Plan Polysubstance abuse Depression  Mild gallstone pancreatitis/choledocholithiasis S/P lap chole with IOC 07/05/17 Dr. Lindie Spruce - POD#1 - IOC was positive, planning transfer to [facility] Monday for ERCP - Tbili 4.7, AST 180, ALT 255 - continue to monitor - mobilize, OOB  FEN: regular diet VTE: SCDs ID: IV zosyn 2/20>>  LOS: 3 days    Wells Guiles , [facility] 07/06/2017, 9:07 AM Pager: [phone] Consults: [phone] Mon-Fri 7:00 am-4:30 pm Sat-Sun 7:00 am-11:30 am

## 2017-07-07 LAB — COMPREHENSIVE METABOLIC PANEL
ALBUMIN: 2.6 g/dL — AB (ref 3.5–5.0)
ALK PHOS: 333 U/L — AB (ref 38–126)
ALT: 245 U/L — ABNORMAL HIGH (ref 17–63)
ANION GAP: 12 (ref 5–15)
AST: 165 U/L — AB (ref 15–41)
BUN: 5 mg/dL — AB (ref 6–20)
CO2: 26 mmol/L (ref 22–32)
Calcium: 8.3 mg/dL — ABNORMAL LOW (ref 8.9–10.3)
Chloride: 99 mmol/L — ABNORMAL LOW (ref 101–111)
Creatinine, Ser: 0.89 mg/dL (ref 0.61–1.24)
GFR calc Af Amer: >60 mL/min (ref >60)
GFR calc non Af Amer: >60 mL/min (ref >60)
GLUCOSE: 86 mg/dL (ref 65–99)
POTASSIUM: 3.1 mmol/L — AB (ref 3.5–5.1)
SODIUM: 137 mmol/L (ref 135–145)
Total Bilirubin: 4.8 mg/dL — ABNORMAL HIGH (ref 0.3–1.2)
Total Protein: 5.5 g/dL — ABNORMAL LOW (ref 6.5–8.1)

## 2017-07-07 LAB — GLUCOSE, CAPILLARY: Glucose-Capillary: 81 mg/dL (ref 65–99)

## 2017-07-07 MED ORDER — POLYETHYLENE GLYCOL 3350 17 G PO PACK
17.0000 g | PACK | Freq: Every day | ORAL | Status: DC
  Administered 2017-07-07: 17 g via ORAL
  Filled 2017-07-07: qty 1

## 2017-07-07 MED ORDER — DOCUSATE SODIUM 100 MG PO CAPS
100.0000 mg | ORAL_CAPSULE | Freq: Two times a day (BID) | ORAL | Status: DC
  Administered 2017-07-07 (×2): 100 mg via ORAL
  Filled 2017-07-07 (×3): qty 1

## 2017-07-07 NOTE — Progress Notes (Signed)
Spoke with [facility] about setting up transport to [facility] for ERCP tomorrow. I answered all questions [facility] had about transfer. They instructed nurse to call in morning around 6am to [facility].

## 2017-07-07 NOTE — Progress Notes (Signed)
[facility] Progress Note  2 Days Post-Op  Subjective: CC: abdominal pain Patient with abdominal pain that is controlled with pain medications, notes pain is worse with eating. Passing flatus, nervous about having a BM.  UOP good. VSS.   Objective: Vital signs in last 24 hours: Temp:  [98.5 F (36.9 C)-98.9 F (37.2 C)] 98.9 F (37.2 C) (02/24 0430) Pulse Rate:  [71-83] 71 (02/24 0430) Resp:  [18] 18 (02/24 0430) BP: (120-129)/(55-70) 129/67 (02/24 0430) SpO2:  [91 %-95 %] 92 % (02/24 0430) Last BM Date: 07/04/17  Intake/Output from previous day: 02/23 0701 - 02/24 0700 In: 875 [I.V.:875] Out: 550 [Urine:550] Intake/Output this shift: No intake/output data recorded.  PE: Gen:  Alert, NAD, pleasant Card:  Regular rate and rhythm, pedal pulses 2+ BL Pulm:  Normal effort, clear to auscultation bilaterally Abd: Soft, mildly tender, non-distended, bowel sounds hypoactive, no HSM, incisions C/D/I Skin: warm and dry, no rashes  Psych: A&Ox3   Lab Results:  Recent Labs    07/05/17 0700 07/06/17 0556  WBC 6.2 6.4  HGB 11.7* 11.0*  HCT 37.0* 34.6*  PLT 269 272   BMET Recent Labs    07/06/17 0556 07/07/17 0546  NA 141 137  K 3.8 3.1*  CL 103 99*  CO2 26 26  GLUCOSE 90 86  BUN 6 5*  CREATININE 0.93 0.89  CALCIUM 8.5* 8.3*   PT/INR No results for input(s): LABPROT, INR in the last 72 hours. CMP     Component Value Date/Time   NA 137 07/07/2017 0546   K 3.1 (L) 07/07/2017 0546   CL 99 (L) 07/07/2017 0546   CO2 26 07/07/2017 0546   GLUCOSE 86 07/07/2017 0546   BUN 5 (L) 07/07/2017 0546   CREATININE 0.89 07/07/2017 0546   CALCIUM 8.3 (L) 07/07/2017 0546   PROT 5.5 (L) 07/07/2017 0546   ALBUMIN 2.6 (L) 07/07/2017 0546   AST 165 (H) 07/07/2017 0546   ALT 245 (H) 07/07/2017 0546   ALKPHOS 333 (H) 07/07/2017 0546   BILITOT 4.8 (H) 07/07/2017 0546   GFRNONAA >60 07/07/2017 0546   GFRAA >60 07/07/2017 0546   Lipase     Component Value  Date/Time   LIPASE 53 (H) 07/05/2017 0700       Studies/Results: Dg Cholangiogram Operative  Result Date: 07/05/2017 CLINICAL DATA:  Cholelithiasis.  Attempted ERCP on 07/04/2016 EXAM: INTRAOPERATIVE CHOLANGIOGRAM TECHNIQUE: Cholangiographic images from the C-arm fluoroscopic device were submitted for interpretation post-operatively. Please see the procedural report for the amount of contrast and the fluoroscopy time utilized. COMPARISON:  Ultrasound 07/03/2017 FINDINGS: The common hepatic duct and central intrahepatic bile ducts are dilated. There is minor filling of the common bile duct. No filling of the duodenum. IMPRESSION: Dilated biliary system and concern for distal biliary obstruction. Electronically Signed   By: Richarda Overlie M.D.   On: 07/05/2017 15:31    Anti-infectives: Anti-infectives (From admission, onward)   Start     Dose/Rate Route Frequency Ordered Stop   07/04/17 0400  piperacillin-tazobactam (ZOSYN) IVPB 3.375 g     3.375 g 12.5 mL/hr over 240 Minutes Intravenous Every 8 hours 07/04/17 0034     07/03/17 2145  piperacillin-tazobactam (ZOSYN) IVPB 3.375 g     3.375 g 100 mL/hr over 30 Minutes Intravenous  Once 07/03/17 2143 07/03/17 2228       Assessment/Plan Polysubstance abuse Depression  Mild gallstone pancreatitis/choledocholithiasis S/P lap chole with IOC 07/05/17 Dr. Lindie Spruce - POD#2 - IOC was positive, planning [facility] - Tbili 4.7, AST 165, ALT 245 - continue to monitor - mobilize, OOB - patient may shower  FEN: regular diet VTE: SCDs ID: IV zosyn 2/20>>    LOS: 4 days    Wells GuilesKelly Rayburn , [facility] 07/07/2017, 9:14 AM Pager: [phone] Consults: [phone] Mon-Fri 7:00 am-4:30 pm Sat-Sun 7:00 am-11:30 am

## 2017-07-08 LAB — COMPREHENSIVE METABOLIC PANEL
ALBUMIN: 2.7 g/dL — AB (ref 3.5–5.0)
ALK PHOS: 331 U/L — AB (ref 38–126)
ALT: 191 U/L — AB (ref 17–63)
AST: 94 U/L — AB (ref 15–41)
Anion gap: 11 (ref 5–15)
BILIRUBIN TOTAL: 3.4 mg/dL — AB (ref 0.3–1.2)
BUN: 7 mg/dL (ref 6–20)
CALCIUM: 8.5 mg/dL — AB (ref 8.9–10.3)
CO2: 26 mmol/L (ref 22–32)
Chloride: 99 mmol/L — ABNORMAL LOW (ref 101–111)
Creatinine, Ser: 0.86 mg/dL (ref 0.61–1.24)
GFR calc Af Amer: >60 mL/min (ref >60)
GFR calc non Af Amer: >60 mL/min (ref >60)
GLUCOSE: 79 mg/dL (ref 65–99)
Potassium: 3.2 mmol/L — ABNORMAL LOW (ref 3.5–5.1)
Sodium: 136 mmol/L (ref 135–145)
TOTAL PROTEIN: 5.9 g/dL — AB (ref 6.5–8.1)

## 2017-07-08 LAB — GLUCOSE, CAPILLARY: Glucose-Capillary: 91 mg/dL (ref 65–99)

## 2017-07-08 NOTE — Progress Notes (Signed)
Verified with [facility], transportation for the pt will be available about 7:30 which is the earliest time they have.

## 2017-07-08 NOTE — Progress Notes (Signed)
Notified R. Caleb PoppNettey, MD on transport situation. Waiting for feedback.

## 2017-07-08 NOTE — Progress Notes (Signed)
Called [facility] and spoke to Endoscopic RN and verified location of Pt's procedure:  [facility] [place] [address] [facility] [address] [place] [address] - [place] Dr. Erskine EmeryMalcolm Branch is the receiving MD - information has been given to [facility]

## 2017-07-08 NOTE — Progress Notes (Signed)
Pt has returned from [facility],  Report called called in at 1 PM from [place], the Stone was able to be removed and bx was taken of the Papilla, no stent was placed. Pt was given 4mg  Zofran, 150 mcg Fentanyl, and 1 L L./Ringers. VS were stable. Pt was to be transported [facility]  I paged Dr. Caleb PoppNettey when Pt arrived back to the unit, and he called back and advised he was not the attending.  He was going to f/u with surgery.  I told him that [facility] [place] this afternoon, and was advised that Surgery was not following Pt.   Dr. Caleb PoppNettey will f/u with Dr. Dwain SarnaWakefield.

## 2017-07-08 NOTE — Progress Notes (Signed)
[facility] on the floor to pick pt up

## 2017-07-08 NOTE — Progress Notes (Signed)
Spoke to Dr. Caleb PoppNettey, Pt will stay overnight tonight, and he will see Pt in the AM, along with GI. [facility] to have a Full Liquid diet today - Dr. Caleb PoppNettey ordered diet.  Explained the above to Pt and his mother, and they are ok with plan of care for the night.

## 2017-07-08 NOTE — Progress Notes (Signed)
[facility] called, not be able to transport pt. Will call [facility], said will be here as soon as they can.

## 2017-07-09 DIAGNOSIS — K8511 Biliary acute pancreatitis with uninfected necrosis: Secondary | ICD-10-CM

## 2017-07-09 DIAGNOSIS — K8050 Calculus of bile duct without cholangitis or cholecystitis without obstruction: Secondary | ICD-10-CM

## 2017-07-09 LAB — COMPREHENSIVE METABOLIC PANEL
ALT: 150 U/L — ABNORMAL HIGH (ref 17–63)
AST: 57 U/L — AB (ref 15–41)
Albumin: 2.7 g/dL — ABNORMAL LOW (ref 3.5–5.0)
Alkaline Phosphatase: 292 U/L — ABNORMAL HIGH (ref 38–126)
Anion gap: 12 (ref 5–15)
BUN: 7 mg/dL (ref 6–20)
CHLORIDE: 100 mmol/L — AB (ref 101–111)
CO2: 26 mmol/L (ref 22–32)
Calcium: 8.8 mg/dL — ABNORMAL LOW (ref 8.9–10.3)
Creatinine, Ser: 0.9 mg/dL (ref 0.61–1.24)
Glucose, Bld: 81 mg/dL (ref 65–99)
POTASSIUM: 3.6 mmol/L (ref 3.5–5.1)
Sodium: 138 mmol/L (ref 135–145)
Total Bilirubin: 3.2 mg/dL — ABNORMAL HIGH (ref 0.3–1.2)
Total Protein: 6.2 g/dL — ABNORMAL LOW (ref 6.5–8.1)

## 2017-07-09 LAB — CULTURE, BLOOD (ROUTINE X 2)
CULTURE: NO GROWTH
Culture: NO GROWTH
SPECIAL REQUESTS: ADEQUATE
SPECIAL REQUESTS: ADEQUATE

## 2017-07-09 LAB — GLUCOSE, CAPILLARY: GLUCOSE-CAPILLARY: 84 mg/dL (ref 65–99)

## 2017-07-09 MED ORDER — DULOXETINE HCL 30 MG PO CPEP: 30.0000 mg | ORAL_CAPSULE | Freq: Every day | ORAL | 0 refills | Status: DC

## 2017-07-09 MED ORDER — METHOCARBAMOL 500 MG PO TABS: 500.0000 mg | ORAL_TABLET | Freq: Three times a day (TID) | ORAL | 0 refills | Status: DC | PRN

## 2017-07-09 NOTE — Progress Notes (Signed)
Reviewed discharge instructions with the Patient and his mother.  Faxed to the [facility] the discharge instructions - per the request of the Patient - for their view to see if the Patient will be able to return to the facility.  Pt was discharged home to his parents - per his mother's request to take him back to [facility] for a week to 10 days, and the Pt was in agreement to returning home for a period of time before returning to [place] if they would take him back/had a bed for him.  Pt was given his f/u appointment with [facility], and his prescriptions to be filled.  Pt was taken by w/c to the front lobby where he met his mother.

## 2017-07-09 NOTE — Progress Notes (Signed)
Daily Rounding Note  07/09/2017, 9:39 AM  LOS: 6 days   SUBJECTIVE:   Chief complaint:  Sore throat after ERCP.  Overall upper abd pain much better.  Tolerating FL diet.   Soft to loose stools.      OBJECTIVE:         Vital signs in last 24 hours:    Temp:  [98.5 F (36.9 C)-98.7 F (37.1 C)] 98.5 F (36.9 C) (02/26 0429) Pulse Rate:  [51-55] 55 (02/26 0429) Resp:  [18] 18 (02/26 0429) BP: (110-134)/(58-70) 134/70 (02/26 0429) SpO2:  [95 %-97 %] 95 % (02/26 0429) Last BM Date: 07/08/17 Filed Weights   07/04/17 0130 07/04/17 1220  Weight: 163 lb 2.3 oz (74 kg) 163 lb 2.3 oz (74 kg)   General: slightly icteric but o/w looks well.  Alert, calm, comfortable   Heart: RRR Chest: clear bil  No dyspnea Abdomen: soft, hypoactive BS.  Glued, intact surgical scars, tender RUQ  Extremities: no CCE Neuro/Psych:  Calm, cooperative, alert, oriented, pleasant.    Intake/Output from previous day: 02/25 0701 - 02/26 0700 In: 340 [P.O.:240; IV Piggyback:100] Out: -   Intake/Output this shift: No intake/output data recorded.  Lab Results: No results for input(s): WBC, HGB, HCT, PLT in the last 72 hours. BMET Recent Labs    07/07/17 0546 07/08/17 0733 07/09/17 0448  NA 137 136 138  K 3.1* 3.2* 3.6  CL 99* 99* 100*  CO2 26 26 26   GLUCOSE 86 79 81  BUN 5* 7 7  CREATININE 0.89 0.86 0.90  CALCIUM 8.3* 8.5* 8.8*   LFT Recent Labs    07/07/17 0546 07/08/17 0733 07/09/17 0448  PROT 5.5* 5.9* 6.2*  ALBUMIN 2.6* 2.7* 2.7*  AST 165* 94* 57*  ALT 245* 191* 150*  ALKPHOS 333* 331* 292*  BILITOT 4.8* 3.4* 3.2*    Scheduled Meds: . docusate sodium  100 mg Oral BID  . DULoxetine  30 mg Oral Daily  . loratadine  10 mg Oral Daily  . multivitamin with minerals  1 tablet Oral Daily  . nicotine  21 mg Transdermal Daily  . polyethylene glycol  17 g Oral Daily   Continuous Infusions: . lactated ringers 10 mL/hr at  07/05/17 1322   PRN Meds:.guaiFENesin, ibuprofen, loperamide, Melatonin, methocarbamol, ondansetron **OR** ondansetron (ZOFRAN) IV, oxyCODONE   ASSESMENT:   *   Biliary pancreatitis. Choledocholithiasis.  Dr Myrtie Neitheranis unable to cannulate CBD at ERCP on 2/21  [facility] and ? intraductal filling defects and no flow into distal CBD of duodenum. Path: chronic cholecystitis, cholelithiasis.    [facility] Dr Caralyn GuileStan Branch. B He noted bulging, congested major papilla, likely due to impacted stone.  Performed sphincterotomy and balloon stone extraction.  Indocin 100 mg PR at end of procedure.   LFTs improved. Abdominal pain improved.     PLAN   *  From GI standpoint, he is fine for discharge today.  Does not need GI follow up.  Will probably need surgical fup but [place] in [place]  His Dad is a psychiatrist (previously and anesthesiologist!) so he will be able to arrange [place] [facility]    *  Stop colace and miralax given loose stools and no previous issues with constipation.   Advance to regular diet.  No need for abx at discharge.      Alex MoccasinSarah Holton Sidman  07/09/2017, 9:39 AM Pager: [phone]

## 2017-07-09 NOTE — Discharge Summary (Signed)
Physician Discharge Summary  [name] FAO:[id number] DOB: 1996-01-10 DOA: 07/03/2017  PCP: Patient, No Pcp Per  Admit date: 07/03/2017 Discharge date: 07/09/2017  [facility]  Recommendations for Outpatient Follow-up:  1. Follow up with PCP in 1 week 2. Follow up with general surgery 3. Please obtain CMP/CBC in one week 4. Please follow up on the following pending results: None  Home Health: None Equipment/Devices: None  Discharge Condition: Stable CODE STATUS: Full code Diet recommendation: Regular diet   Brief/Interim Summary:  Admission HPI written by Lorretta Harp, MD   Chief Complaint: Right upper quadrant abdominal pain and jaundice  HPI: Alex Potter is a 22 y.o. male with medical history significant of drug abuse, tobacco abuse, alcohol abuse, depression, who presents with right upper quadrant abdominal pain and jaundice.  Patient states that he has been having right upper quadrant abdominal pain the past 3 days. He is constant, 6 out of 10 in severity, radiating to the right lower back, sharp. No fever or chills. Patient denies nausea, vomiting, diarrhea or abdominal pain. Patient was noted to have yellow skin and eyes. Patient does not have chest pain, shortness breath, cough. Of note, patient is in [facility] where he is undergoing alcohol and drug treatment. He has been there for about 16 days. He reports drinking alcohol nearly daily, mostly beer. He also endorses marijuana and meth use, but denies any type of IV drug use.   ED Course: pt was found to have WBC 6.6, INR 1.0, lipase 279, electrolytes renal function okay, abnormal liver functions with ALP 277, AST 158, ALT 288, total bilirubin 5.8. Temperature normal, bradycardia, oxygen saturation 97% on room air. Patient is admitted to MedSurg bed as inpatient. Gen. surgeon, Dr. Dwain Sarna and [facility] were consulted.    Hospital course:  Acute  cholecystitis S/p laparoscopic cholecystectomy. Outpatient follow-up with general surgery. Antibiotics discontinued before discharge.  CBD Abnormal liver function Hyperbilirubinemia Patient failed ERCP. Transfered to [facility] for ERCP with successful removal of CBD stone. AST/ALT and bilirubin trending down. Outpatient follow-up and labs.  Tobacco abuse Polysubstance abuse Counseled. [facility]  Depression Stable. Continued Cymbalta    Discharge Diagnoses:  Principal Problem:   Right upper quadrant abdominal pain Active Problems:   Acute cholecystitis   Gallstone   Tobacco abuse   Drug abuse (HCC)   Cholecystitis   Abnormal liver function   Depression    Discharge Instructions   Allergies as of 07/09/2017      Reactions   Mushroom Extract Complex Itching, Swelling      Medication List    TAKE these medications   alum & mag hydroxide-simeth 200-200-20 MG/5ML suspension Commonly known as:  MAALOX/MYLANTA Take 15-30 mLs by mouth as needed for indigestion or heartburn.   Benzocaine 15 MG Lozg Use as directed 1 each in the mouth or throat every 4 (four) hours as needed (for sore throat).   benzonatate 100 MG capsule Commonly known as:  TESSALON Take 200 mg by mouth 3 (three) times daily as needed for cough.   cetirizine 10 MG tablet Commonly known as:  ZYRTEC Take 10 mg by mouth daily as needed for allergies.   DULoxetine 30 MG capsule Commonly known as:  CYMBALTA Take 1 capsule (30 mg total) by mouth daily.   guaiFENesin 600 MG 12 hr tablet Commonly known as:  MUCINEX Take 600 mg by mouth 2 (two) times daily as needed for cough or to loosen phlegm.  ibuprofen 600 MG tablet Commonly known as:  ADVIL,MOTRIN Take 600 mg by mouth every 6 (six) hours as needed for fever or mild pain.   loperamide 2 MG tablet Commonly known as:  IMODIUM A-D Take 2 mg by mouth 4 (four) times daily as needed for diarrhea or loose stools.   Melatonin 5 MG  Tabs Take 5-10 mg by mouth at bedtime as needed (for insomnia).   methocarbamol 500 MG tablet Commonly known as:  ROBAXIN Take 1 tablet (500 mg total) by mouth every 8 (eight) hours as needed for muscle spasms. What changed:  how much to take   multivitamin with minerals Tabs tablet Take 1 tablet by mouth daily.   NAUSEA CONTROL 1.87-1.87-21.5 Soln Take 5-10 mLs by mouth every 15 (fifteen) minutes as needed (for nausea).   ondansetron 8 MG tablet Commonly known as:  ZOFRAN Take 8 mg by mouth 2 (two) times daily as needed for nausea or vomiting.      Follow-up Information    [facility], [place]. Go on 07/23/2017.   Specialty:  General Surgery Why:  Your appointment is 07/23/2017 at 10AM. Please arrive 30 mintues prior to your appointment to check in and fill out paperwork. Bring photo ID and insurance information. Please call & cancel appointment if you find a provider closer to home to follow up with Contact information: [address] [place] [address] [phone]         Allergies  Allergen Reactions  . Mushroom Extract Complex Itching and Swelling    Consultations:  Gastroenterology  General surgery   Procedures/Studies: Dg Cholangiogram Operative  Result Date: 07/05/2017 CLINICAL DATA:  Cholelithiasis.  Attempted ERCP on 07/04/2016 EXAM: INTRAOPERATIVE CHOLANGIOGRAM TECHNIQUE: Cholangiographic images from the C-arm fluoroscopic device were submitted for interpretation post-operatively. Please see the procedural report for the amount of contrast and the fluoroscopy time utilized. COMPARISON:  Ultrasound 07/03/2017 FINDINGS: The common hepatic duct and central intrahepatic bile ducts are dilated. There is minor filling of the common bile duct. No filling of the duodenum. IMPRESSION: Dilated biliary system and concern for distal biliary obstruction. Electronically Signed   By: Richarda Overlie M.D.   On: 07/05/2017 15:31   Dg  C-arm 1-60 Min-no Report  Result Date: 07/04/2017 Fluoroscopy was utilized by the requesting physician.  No radiographic interpretation.   US Abdomen Limited Ruq  Result Date: 07/03/2017 CLINICAL DATA:  21 year old male with history of right upper quadrant abdominal pain and jaundice for the past 2 weeks. EXAM: ULTRASOUND ABDOMEN LIMITED RIGHT UPPER QUADRANT COMPARISON:  None. FINDINGS: Gallbladder: Large amount of biliary sludge and multiple small gallstones filling the lumen of the gallbladder. Gallbladder is moderately distended. Gallbladder wall is thickened measuring 7.4 mm. No definite pericholecystic fluid. However, per report, sonographic Murphy sign was positive. Common bile duct: Diameter: 10.4 mm in the porta hepatis. Low level echogenicity (without shadowing) in the common bile duct could indicate the presence of intraductal sludge. Liver: No focal lesion identified. There appears to be some mild intrahepatic biliary ductal dilatation. Within normal limits in parenchymal echogenicity. Portal vein is patent on color Doppler imaging with normal direction of blood flow towards the liver. IMPRESSION: 1. Biliary sludge and cholelithiasis with evidence suggestive of acute cholecystitis, as well as common bile duct dilatation and mild intrahepatic biliary ductal dilatation, concerning for ductal obstruction (potentially related to intraductal sludge). Surgical consultation is recommended. Electronically Signed   By: Trudie Reed M.D.   On: 07/03/2017 21:11  Subjective: Pain improved. Eating well. Bowel movements.  Discharge Exam: Vitals:   07/08/17 2138 07/09/17 0429  BP: (!) 110/58 134/70  Pulse: (!) 51 (!) 55  Resp: 18 18  Temp: 98.7 F (37.1 C) 98.5 F (36.9 C)  SpO2: 97% 95%   Vitals:   07/08/17 0448 07/08/17 0700 07/08/17 2138 07/09/17 0429  BP: 130/69 124/76 (!) 110/58 134/70  Pulse: 68 66 (!) 51 (!) 55  Resp: 17 18 18 18   Temp: 99.6 F (37.6 C) 98.9 F (37.2 C)  98.7 F (37.1 C) 98.5 F (36.9 C)  TempSrc: Oral Oral Oral Oral  SpO2: 95% 96% 97% 95%  Weight:      Height:        General: Pt is alert, awake, not in acute distress Cardiovascular: RRR, S1/S2 +, no rubs, no gallops Respiratory: CTA bilaterally, no wheezing, no rhonchi Abdominal: Soft, NT, ND, bowel sounds + Extremities: no edema, no cyanosis    The results of significant diagnostics from this hospitalization (including imaging, microbiology, ancillary and laboratory) are listed below for reference.     Microbiology: Recent Results (from the past 240 hour(s))  Culture, blood (Routine X 2) w Reflex to ID Panel     Status: None   Collection Time: 07/04/17 12:10 AM  Result Value Ref Range Status   Specimen Description BLOOD RIGHT ANTECUBITAL  Final   Special Requests   Final    BOTTLES DRAWN AEROBIC AND ANAEROBIC Blood Culture adequate volume   Culture   Final    NO GROWTH 5 DAYS Performed at [facility], [address], [place], [place] [address]    Report Status 07/09/2017 FINAL  Final  Culture, blood (Routine X 2) w Reflex to ID Panel     Status: None   Collection Time: 07/04/17 12:15 AM  Result Value Ref Range Status   Specimen Description BLOOD RIGHT HAND  Final   Special Requests   Final    BOTTLES DRAWN AEROBIC AND ANAEROBIC Blood Culture adequate volume   Culture   Final    NO GROWTH 5 DAYS Performed at [facility], [address], [place], [place] [address]    Report Status 07/09/2017 FINAL  Final  MRSA PCR Screening     Status: None   Collection Time: 07/04/17  1:42 AM  Result Value Ref Range Status   MRSA by PCR NEGATIVE NEGATIVE Final    Comment:        The GeneXpert MRSA Assay (FDA approved for NASAL specimens only), is one component of a comprehensive MRSA colonization surveillance program. It is not intended to diagnose MRSA infection nor to guide or monitor treatment for MRSA infections. Performed at [facility], [address] [place], [place] [address]      Labs: BNP (last 3 results) No results for input(s): BNP in the last 8760 hours. Basic Metabolic Panel: Recent Labs  Lab 07/05/17 0700 07/06/17 0556 07/07/17 0546 07/08/17 0733 07/09/17 0448  NA 142 141 137 136 138  K 3.9 3.8 3.1* 3.2* 3.6  CL 107 103 99* 99* 100*  CO2 22 26 26 26 26   GLUCOSE 94 90 86 79 81  BUN 6 6 5* 7 7  CREATININE 0.91 0.93 0.89 0.86 0.90  CALCIUM 8.9 8.5* 8.3* 8.5* 8.8*   Liver Function Tests: Recent Labs  Lab 07/05/17 0700 07/06/17 0556 07/07/17 0546 07/08/17 0733 07/09/17 0448  AST 139* 180* 165* 94* 57*  ALT 227* 255* 245*  191* 150*  ALKPHOS 267* 282* 333* 331* 292*  BILITOT 4.8* 4.7* 4.8* 3.4* 3.2*  PROT 5.8* 5.4* 5.5* 5.9* 6.2*  ALBUMIN 2.8* 2.7* 2.6* 2.7* 2.7*   Recent Labs  Lab 07/03/17 1500 07/04/17 0621 07/05/17 0700  LIPASE 279* 163* 53*   Recent Labs  Lab 07/03/17 2149  AMMONIA 42*   CBC: Recent Labs  Lab 07/03/17 1500 07/05/17 0700 07/06/17 0556  WBC 6.6 6.2 6.4  NEUTROABS  --  3.9 4.2  HGB 14.0 11.7* 11.0*  HCT 43.1 37.0* 34.6*  MCV 79.5 79.4 79.2  PLT 284 269 272   Cardiac Enzymes: No results for input(s): CKTOTAL, CKMB, CKMBINDEX, TROPONINI in the last 168 hours. BNP: Invalid input(s): POCBNP CBG: Recent Labs  Lab 07/05/17 0720 07/06/17 0833 07/07/17 0753 07/08/17 0823 07/09/17 0801  GLUCAP 85 91 81 91 84   D-Dimer No results for input(s): DDIMER in the last 72 hours. Hgb A1c No results for input(s): HGBA1C in the last 72 hours. Lipid Profile No results for input(s): CHOL, HDL, LDLCALC, TRIG, CHOLHDL, LDLDIRECT in the last 72 hours. Thyroid function studies No results for input(s): TSH, T4TOTAL, T3FREE, THYROIDAB in the last 72 hours.  Invalid input(s): FREET3 Anemia work up No results for input(s): VITAMINB12, FOLATE, FERRITIN, TIBC, IRON, RETICCTPCT in the last 72 hours. Urinalysis    Component Value Date/Time   COLORURINE AMBER (A) 07/03/2017 2119    APPEARANCEUR CLEAR 07/03/2017 2119   LABSPEC 1.025 07/03/2017 2119   PHURINE 5.0 07/03/2017 2119   GLUCOSEU NEGATIVE 07/03/2017 2119   HGBUR NEGATIVE 07/03/2017 2119   BILIRUBINUR MODERATE (A) 07/03/2017 2119   KETONESUR 5 (A) 07/03/2017 2119   PROTEINUR NEGATIVE 07/03/2017 2119   NITRITE NEGATIVE 07/03/2017 2119   LEUKOCYTESUR NEGATIVE 07/03/2017 2119   Sepsis Labs Invalid input(s): PROCALCITONIN,  WBC,  LACTICIDVEN Microbiology Recent Results (from the past 240 hour(s))  Culture, blood (Routine X 2) w Reflex to ID Panel     Status: None   Collection Time: 07/04/17 12:10 AM  Result Value Ref Range Status   Specimen Description BLOOD RIGHT ANTECUBITAL  Final   Special Requests   Final    BOTTLES DRAWN AEROBIC AND ANAEROBIC Blood Culture adequate volume   Culture   Final    NO GROWTH 5 DAYS Performed at [facility], [address] [place], [place] [address]    Report Status 07/09/2017 FINAL  Final  Culture, blood (Routine X 2) w Reflex to ID Panel     Status: None   Collection Time: 07/04/17 12:15 AM  Result Value Ref Range Status   Specimen Description BLOOD RIGHT HAND  Final   Special Requests   Final    BOTTLES DRAWN AEROBIC AND ANAEROBIC Blood Culture adequate volume   Culture   Final    NO GROWTH 5 DAYS Performed at [facility], [address] [place], [place] [address]    Report Status 07/09/2017 FINAL  Final  MRSA PCR Screening     Status: None   Collection Time: 07/04/17  1:42 AM  Result Value Ref Range Status   MRSA by PCR NEGATIVE NEGATIVE Final    Comment:        The GeneXpert MRSA Assay (FDA approved for NASAL specimens only), is one component of a comprehensive MRSA colonization surveillance program. It is not intended to diagnose MRSA infection nor to guide or monitor treatment for MRSA infections. Performed at [facility], [address] [place], [place]  [address]      SIGNED:   Jacquelin Hawking, MD [facility] 07/09/2017, 2:12 PM Pager [phone]  If 7PM-7AM, please contact night-coverage [email or web address] Password [email or web address]

## 2017-07-09 NOTE — Discharge Instructions (Signed)
°LAPAROSCOPIC SURGERY: POST OP INSTRUCTIONS  °1. DIET: Follow a light bland diet the first 24 hours after arrival home, such as soup, liquids, crackers, etc. Be sure to include lots of fluids daily. Avoid fast food or heavy meals as your are more likely to get nauseated. Eat a low fat the next few days after surgery.  °2. Take your usually prescribed home medications unless otherwise directed. °3. PAIN CONTROL:  °1. Pain is best controlled by a usual combination of three different methods TOGETHER:  °1. Ice/Heat °2. Over the counter pain medication °3. Prescription pain medication °2. Most patients will experience some swelling and bruising around the incisions. Ice packs or heating pads (30-60 minutes up to 6 times a day) will help. Use ice for the first few days to help decrease swelling and bruising, then switch to heat to help relax tight/sore spots and speed recovery. Some people prefer to use ice alone, heat alone, alternating between ice & heat. Experiment to what works for you. Swelling and bruising can take several weeks to resolve.  °3. It is helpful to take an over-the-counter pain medication regularly for the first few weeks. Choose one of the following that works best for you:  °1. Naproxen (Aleve, etc) Two 220mg tabs twice a day °2. Ibuprofen (Advil, etc) Three 200mg tabs four times a day (every meal & bedtime) °3. Acetaminophen (Tylenol, etc) 500-650mg four times a day (every meal & bedtime) °4. A prescription for pain medication (such as oxycodone, hydrocodone, etc) should be given to you upon discharge. Take your pain medication as prescribed.  °1. If you are having problems/concerns with the prescription medicine (does not control pain, nausea, vomiting, rash, itching, etc), please call us [phone] to see if we need to switch you to a different pain medicine that will work better for you and/or control your side effect better. °2. If you need a refill on your pain medication, please contact  your pharmacy. They will contact our office to request authorization. Prescriptions will not be filled after 5 pm or on week-ends. °4. Avoid getting constipated. Between the surgery and the pain medications, it is common to experience some constipation. Increasing fluid intake and taking a fiber supplement (such as Metamucil, Citrucel, FiberCon, MiraLax, etc) 1-2 times a day regularly will usually help prevent this problem from occurring. A mild laxative (prune juice, Milk of Magnesia, MiraLax, etc) should be taken according to package directions if there are no bowel movements after 48 hours.  °5. Watch out for diarrhea. If you have many loose bowel movements, simplify your diet to bland foods & liquids for a few days. Stop any stool softeners and decrease your fiber supplement. Switching to mild anti-diarrheal medications (Kayopectate, Pepto Bismol) can help. If this worsens or does not improve, please call us. °6. Wash / shower every day. You may shower over the dressings as they are waterproof. Continue to shower over incision(s) after the dressing is off. If there is glue over the incisions try not to pick it off, let it fall off naturally. °7. Remove your waterproof bandages 2 days after surgery. You may leave the incision open to air. You may replace a dressing/Band-Aid to cover the incision for comfort if you wish.  °8. ACTIVITIES as tolerated:  °1. You may resume regular (light) daily activities beginning the next day--such as daily self-care, walking, climbing stairs--gradually increasing activities as tolerated. If you can walk 30 minutes without difficulty, it is safe to try more intense activity   such as jogging, treadmill, bicycling, low-impact aerobics, swimming, etc. °2. Save the most intensive and strenuous activity for last such as sit-ups, heavy lifting, contact sports, etc Refrain from any heavy lifting or straining until you are off narcotics for pain control. For the first 2-3 weeks do not lift  over 10-15lb.  °3. DO NOT PUSH THROUGH PAIN. Let pain be your guide: If it hurts to do something, don't do it. Pain is your body warning you to avoid that activity for another week until the pain goes down. °4. You may drive when you are no longer taking prescription pain medication, you can comfortably wear a seatbelt, and you can safely maneuver your car and apply brakes. °5. You may have sexual intercourse when it is comfortable.  °9. FOLLOW UP in our office  °1. Please call [facility] at [phone] to set up an appointment to see your surgeon in the office for a follow-up appointment approximately 2-3 weeks after your surgery. °2. Make sure that you call for this appointment the day you arrive home to insure a convenient appointment time. °     10. IF YOU HAVE DISABILITY OR FAMILY LEAVE FORMS, BRING THEM TO THE               OFFICE FOR PROCESSING.  ° °WHEN TO CALL US [phone]:  °1. Poor pain control °2. Reactions / problems with new medications (rash/itching, nausea, etc)  °3. Fever over 101.5 F (38.5 C) °4. Inability to urinate °5. Nausea and/or vomiting °6. Worsening swelling or bruising °7. Continued bleeding from incision. °8. Increased pain, redness, or drainage from the incision ° °The clinic staff is available to answer your questions during regular business hours (8:30am-5pm). Please don’t hesitate to call and ask to speak to one of our nurses for clinical concerns.  °If you have a medical emergency, go to the nearest emergency room or call 911.  °A surgeon from [facility] is always on call at the hospitals  ° °[facility]  °[address], [address], [place], [place] [address] ?  °MAIN: [phone] ? TOLL FREE: [phone] ?  °FAX [phone]  °[email or web address] ° ° °

## 2017-07-09 NOTE — Progress Notes (Signed)
[facility] Progress Note  4 Days Post-Op  Subjective: CC-  Sitting up in chair, mother at bedside. States that he is a little sore but overall doing well. Pain well controlled with cymbalta, he has not required any narcotics today. Tolerating diet. Denies n/v.  Objective: Vital signs in last 24 hours: Temp:  [98.5 F (36.9 C)-98.7 F (37.1 C)] 98.5 F (36.9 C) (02/26 0429) Pulse Rate:  [51-55] 55 (02/26 0429) Resp:  [18] 18 (02/26 0429) BP: (110-134)/(58-70) 134/70 (02/26 0429) SpO2:  [95 %-97 %] 95 % (02/26 0429) Last BM Date: 07/08/17  Intake/Output from previous day: 02/25 0701 - 02/26 0700 In: 340 [P.O.:240; IV Piggyback:100] Out: -  Intake/Output this shift: No intake/output data recorded.  PE: Gen:  Alert, NAD, pleasant HEENT: EOM's intact, pupils equal and round Pulm:  effort normal Abd: Soft, NT/ND, +BS, lap incisions C/D/I with steris in place Psych: A&Ox3  Skin: no rashes noted, warm and dry  Lab Results:  No results for input(s): WBC, HGB, HCT, PLT in the last 72 hours. BMET Recent Labs    07/08/17 0733 07/09/17 0448  NA 136 138  K 3.2* 3.6  CL 99* 100*  CO2 26 26  GLUCOSE 79 81  BUN 7 7  CREATININE 0.86 0.90  CALCIUM 8.5* 8.8*   PT/INR No results for input(s): LABPROT, INR in the last 72 hours. CMP     Component Value Date/Time   NA 138 07/09/2017 0448   K 3.6 07/09/2017 0448   CL 100 (L) 07/09/2017 0448   CO2 26 07/09/2017 0448   GLUCOSE 81 07/09/2017 0448   BUN 7 07/09/2017 0448   CREATININE 0.90 07/09/2017 0448   CALCIUM 8.8 (L) 07/09/2017 0448   PROT 6.2 (L) 07/09/2017 0448   ALBUMIN 2.7 (L) 07/09/2017 0448   AST 57 (H) 07/09/2017 0448   ALT 150 (H) 07/09/2017 0448   ALKPHOS 292 (H) 07/09/2017 0448   BILITOT 3.2 (H) 07/09/2017 0448   GFRNONAA >60 07/09/2017 0448   GFRAA >60 07/09/2017 0448   Lipase     Component Value Date/Time   LIPASE 53 (H) 07/05/2017 0700       Studies/Results: No results  found.  Anti-infectives: Anti-infectives (From admission, onward)   Start     Dose/Rate Route Frequency Ordered Stop   07/04/17 0400  piperacillin-tazobactam (ZOSYN) IVPB 3.375 g  Status:  Discontinued     3.375 g 12.5 mL/hr over 240 Minutes Intravenous Every 8 hours 07/04/17 0034 07/09/17 0804   07/03/17 2145  piperacillin-tazobactam (ZOSYN) IVPB 3.375 g     3.375 g 100 mL/hr over 30 Minutes Intravenous  Once 07/03/17 2143 07/03/17 2228       Assessment/Plan Polysubstance abuse Depression  Mild gallstone pancreatitis/choledocholithiasis S/P lap chole with IOC 07/05/17 Dr. Lindie SpruceWyatt - POD#4 - IOC was positive, transferred to/from [facility] yesterday and is s/p successful ERCP there - bilirubin trending down 3.2 from 3.4 - pain controlled and tolerating diet  FEN: regular diet VTE: SCDs ID: IV zosyn 2/20>>2/26  Plan: Patient stable for discharge from surgical standpoint. He does not need a rx for narcotic pain medication, just refill on cymbalta.  I will make him a f/u appointment in our office, but patient will call to cancel if he sees someone closer to home. Discharge instructions on AVS.   LOS: 6 days    Franne FortsBrooke A Meuth , [facility] 07/09/2017, 12:25 PM Pager: [phone] Consults: [phone] Mon-Fri 7:00 am-4:30 pm Sat-Sun 7:00 am-11:30 am

## 2017-11-03 ENCOUNTER — Inpatient Hospital Stay (HOSPITAL_COMMUNITY): Payer: BLUE CROSS/BLUE SHIELD

## 2017-11-03 ENCOUNTER — Emergency Department (HOSPITAL_COMMUNITY): Payer: BLUE CROSS/BLUE SHIELD

## 2017-11-03 ENCOUNTER — Inpatient Hospital Stay (HOSPITAL_COMMUNITY)
Admission: EM | Admit: 2017-11-03 | Discharge: 2017-11-07 | DRG: 917 | Disposition: A | Discharge status: Home / Self Care | Payer: BLUE CROSS/BLUE SHIELD | Attending: Internal Medicine | Admitting: Internal Medicine

## 2017-11-03 DIAGNOSIS — T401X1A Poisoning by heroin, accidental (unintentional), initial encounter: Principal | ICD-10-CM | POA: Diagnosis present

## 2017-11-03 DIAGNOSIS — I10 Essential (primary) hypertension: Secondary | ICD-10-CM | POA: Diagnosis present

## 2017-11-03 DIAGNOSIS — T17908A Unspecified foreign body in respiratory tract, part unspecified causing other injury, initial encounter: Secondary | ICD-10-CM

## 2017-11-03 DIAGNOSIS — Z9049 Acquired absence of other specified parts of digestive tract: Secondary | ICD-10-CM | POA: Diagnosis not present

## 2017-11-03 DIAGNOSIS — J690 Pneumonitis due to inhalation of food and vomit: Secondary | ICD-10-CM | POA: Diagnosis present

## 2017-11-03 DIAGNOSIS — R292 Abnormal reflex: Secondary | ICD-10-CM | POA: Diagnosis present

## 2017-11-03 DIAGNOSIS — Z91018 Allergy to other foods: Secondary | ICD-10-CM | POA: Diagnosis not present

## 2017-11-03 DIAGNOSIS — Z590 Homelessness: Secondary | ICD-10-CM | POA: Diagnosis not present

## 2017-11-03 DIAGNOSIS — R6520 Severe sepsis without septic shock: Secondary | ICD-10-CM | POA: Diagnosis present

## 2017-11-03 DIAGNOSIS — F1290 Cannabis use, unspecified, uncomplicated: Secondary | ICD-10-CM | POA: Diagnosis not present

## 2017-11-03 DIAGNOSIS — K921 Melena: Secondary | ICD-10-CM | POA: Diagnosis not present

## 2017-11-03 DIAGNOSIS — G9341 Metabolic encephalopathy: Secondary | ICD-10-CM | POA: Diagnosis present

## 2017-11-03 DIAGNOSIS — A419 Sepsis, unspecified organism: Secondary | ICD-10-CM

## 2017-11-03 DIAGNOSIS — E872 Acidosis, unspecified: Secondary | ICD-10-CM

## 2017-11-03 DIAGNOSIS — F1910 Other psychoactive substance abuse, uncomplicated: Secondary | ICD-10-CM | POA: Diagnosis not present

## 2017-11-03 DIAGNOSIS — R4182 Altered mental status, unspecified: Secondary | ICD-10-CM | POA: Diagnosis present

## 2017-11-03 DIAGNOSIS — J9601 Acute respiratory failure with hypoxia: Secondary | ICD-10-CM | POA: Diagnosis present

## 2017-11-03 DIAGNOSIS — Z781 Physical restraint status: Secondary | ICD-10-CM

## 2017-11-03 DIAGNOSIS — Z811 Family history of alcohol abuse and dependence: Secondary | ICD-10-CM | POA: Diagnosis not present

## 2017-11-03 DIAGNOSIS — F17200 Nicotine dependence, unspecified, uncomplicated: Secondary | ICD-10-CM | POA: Diagnosis present

## 2017-11-03 DIAGNOSIS — G936 Cerebral edema: Secondary | ICD-10-CM | POA: Diagnosis present

## 2017-11-03 DIAGNOSIS — Z9911 Dependence on respirator [ventilator] status: Secondary | ICD-10-CM

## 2017-11-03 DIAGNOSIS — G9340 Encephalopathy, unspecified: Secondary | ICD-10-CM

## 2017-11-03 DIAGNOSIS — F17210 Nicotine dependence, cigarettes, uncomplicated: Secondary | ICD-10-CM | POA: Diagnosis not present

## 2017-11-03 DIAGNOSIS — Z915 Personal history of self-harm: Secondary | ICD-10-CM

## 2017-11-03 DIAGNOSIS — J9690 Respiratory failure, unspecified, unspecified whether with hypoxia or hypercapnia: Secondary | ICD-10-CM

## 2017-11-03 LAB — BLOOD GAS, ARTERIAL
Acid-Base Excess: 1.2 mmol/L (ref 0.0–2.0)
Bicarbonate: 25.6 mmol/L (ref 20.0–28.0)
Drawn by: 225631
O2 CONTENT: 4 L/min
O2 Saturation: 96 %
PATIENT TEMPERATURE: 99.1
PCO2 ART: 42.9 mmHg (ref 32.0–48.0)
PO2 ART: 79.8 mmHg — AB (ref 83.0–108.0)
pH, Arterial: 7.395 (ref 7.350–7.450)

## 2017-11-03 LAB — POCT I-STAT 3, ART BLOOD GAS (G3+)
Acid-base deficit: 2 mmol/L (ref 0.0–2.0)
Bicarbonate: 24.2 mmol/L (ref 20.0–28.0)
O2 SAT: 100 %
PO2 ART: 239 mmHg — AB (ref 83.0–108.0)
Patient temperature: 99.2
TCO2: 26 mmol/L (ref 22–32)
pCO2 arterial: 44.8 mmHg (ref 32.0–48.0)
pH, Arterial: 7.343 — ABNORMAL LOW (ref 7.350–7.450)

## 2017-11-03 LAB — CBC WITH DIFFERENTIAL/PLATELET
BASOS ABS: 0 10*3/uL (ref 0.0–0.1)
Basophils Absolute: 0 10*3/uL (ref 0.0–0.1)
Basophils Relative: 0 %
Basophils Relative: 0 %
EOS ABS: 0 10*3/uL (ref 0.0–0.7)
EOS ABS: 0 10*3/uL (ref 0.0–0.7)
EOS PCT: 0 %
EOS PCT: 0 %
HCT: 45.3 % (ref 39.0–52.0)
HCT: 38.4 % — ABNORMAL LOW (ref 39.0–52.0)
HEMOGLOBIN: 12.5 g/dL — AB (ref 13.0–17.0)
Hemoglobin: 14.5 g/dL (ref 13.0–17.0)
LYMPHS ABS: 1.9 10*3/uL (ref 0.7–4.0)
LYMPHS PCT: 10 %
LYMPHS PCT: 14 %
Lymphs Abs: 2.7 10*3/uL (ref 0.7–4.0)
MCH: 26.1 pg (ref 26.0–34.0)
MCH: 25.8 pg — ABNORMAL LOW (ref 26.0–34.0)
MCHC: 32 g/dL (ref 30.0–36.0)
MCHC: 32.6 g/dL (ref 30.0–36.0)
MCV: 81.6 fL (ref 78.0–100.0)
MCV: 79.3 fL (ref 78.0–100.0)
MONO ABS: 1.5 10*3/uL — AB (ref 0.1–1.0)
MONOS PCT: 10 %
Monocytes Absolute: 1.9 10*3/uL — ABNORMAL HIGH (ref 0.1–1.0)
Monocytes Relative: 8 %
NEUTROS PCT: 76 %
Neutro Abs: 16.1 10*3/uL — ABNORMAL HIGH (ref 1.7–7.7)
Neutro Abs: 14.3 10*3/uL — ABNORMAL HIGH (ref 1.7–7.7)
Neutrophils Relative %: 82 %
PLATELETS: 308 10*3/uL (ref 150–400)
Platelets: 218 10*3/uL (ref 150–400)
RBC: 5.55 MIL/uL (ref 4.22–5.81)
RBC: 4.84 MIL/uL (ref 4.22–5.81)
RDW: 13.8 % (ref 11.5–15.5)
RDW: 13.8 % (ref 11.5–15.5)
WBC: 19.5 10*3/uL — AB (ref 4.0–10.5)
WBC: 19 10*3/uL — ABNORMAL HIGH (ref 4.0–10.5)

## 2017-11-03 LAB — I-STAT CHEM 8, ED
BUN: 15 mg/dL (ref 6–20)
CALCIUM ION: 1.19 mmol/L (ref 1.15–1.40)
Chloride: 106 mmol/L (ref 101–111)
Creatinine, Ser: 1 mg/dL (ref 0.61–1.24)
GLUCOSE: 119 mg/dL — AB (ref 65–99)
HCT: 46 % (ref 39.0–52.0)
HEMOGLOBIN: 15.6 g/dL (ref 13.0–17.0)
Potassium: 4.1 mmol/L (ref 3.5–5.1)
Sodium: 145 mmol/L (ref 135–145)
TCO2: 24 mmol/L (ref 22–32)

## 2017-11-03 LAB — COMPREHENSIVE METABOLIC PANEL
ALK PHOS: 50 U/L (ref 38–126)
ALT: 139 U/L — AB (ref 17–63)
ALT: 104 U/L — ABNORMAL HIGH (ref 17–63)
ANION GAP: 14 (ref 5–15)
AST: 117 U/L — ABNORMAL HIGH (ref 15–41)
AST: 82 U/L — ABNORMAL HIGH (ref 15–41)
Albumin: 4 g/dL (ref 3.5–5.0)
Albumin: 2.9 g/dL — ABNORMAL LOW (ref 3.5–5.0)
Alkaline Phosphatase: 77 U/L (ref 38–126)
Anion gap: 8 (ref 5–15)
BILIRUBIN TOTAL: 0.7 mg/dL (ref 0.3–1.2)
BUN: 15 mg/dL (ref 6–20)
BUN: 11 mg/dL (ref 6–20)
CALCIUM: 8.2 mg/dL — AB (ref 8.9–10.3)
CHLORIDE: 108 mmol/L (ref 101–111)
CO2: 24 mmol/L (ref 22–32)
CO2: 24 mmol/L (ref 22–32)
CREATININE: 0.91 mg/dL (ref 0.61–1.24)
Calcium: 9.5 mg/dL (ref 8.9–10.3)
Chloride: 112 mmol/L — ABNORMAL HIGH (ref 101–111)
Creatinine, Ser: 1.1 mg/dL (ref 0.61–1.24)
GFR calc Af Amer: >60 mL/min (ref >60)
GFR calc non Af Amer: >60 mL/min (ref >60)
Glucose, Bld: 125 mg/dL — ABNORMAL HIGH (ref 65–99)
Glucose, Bld: 92 mg/dL (ref 65–99)
POTASSIUM: 4 mmol/L (ref 3.5–5.1)
Potassium: 4 mmol/L (ref 3.5–5.1)
SODIUM: 146 mmol/L — AB (ref 135–145)
SODIUM: 144 mmol/L (ref 135–145)
TOTAL PROTEIN: 7.8 g/dL (ref 6.5–8.1)
TOTAL PROTEIN: 5.9 g/dL — AB (ref 6.5–8.1)
Total Bilirubin: 0.4 mg/dL (ref 0.3–1.2)

## 2017-11-03 LAB — URINALYSIS, ROUTINE W REFLEX MICROSCOPIC
Bacteria, UA: NONE SEEN
Bilirubin Urine: NEGATIVE
Glucose, UA: 50 mg/dL — AB
Ketones, ur: NEGATIVE mg/dL
Leukocytes, UA: NEGATIVE
Nitrite: NEGATIVE
Protein, ur: NEGATIVE mg/dL
SPECIFIC GRAVITY, URINE: 1.02 (ref 1.005–1.030)
pH: 5 (ref 5.0–8.0)

## 2017-11-03 LAB — MAGNESIUM: Magnesium: 1.7 mg/dL (ref 1.7–2.4)

## 2017-11-03 LAB — LACTIC ACID, PLASMA
LACTIC ACID, VENOUS: 7 mmol/L — AB (ref 0.5–1.9)
Lactic Acid, Venous: 2.4 mmol/L (ref 0.5–1.9)

## 2017-11-03 LAB — RAPID URINE DRUG SCREEN, HOSP PERFORMED
Amphetamines: NOT DETECTED
BENZODIAZEPINES: NOT DETECTED
COCAINE: NOT DETECTED
Opiates: NOT DETECTED
Tetrahydrocannabinol: POSITIVE — AB

## 2017-11-03 LAB — AMMONIA: AMMONIA: 38 umol/L — AB (ref 9–35)

## 2017-11-03 LAB — TSH: TSH: 0.938 u[IU]/mL (ref 0.350–4.500)

## 2017-11-03 LAB — PROTIME-INR
INR: 1.09
Prothrombin Time: 14 seconds (ref 11.4–15.2)

## 2017-11-03 LAB — I-STAT CG4 LACTIC ACID, ED
LACTIC ACID, VENOUS: 5.27 mmol/L — AB (ref 0.5–1.9)
Lactic Acid, Venous: 2.97 mmol/L (ref 0.5–1.9)

## 2017-11-03 LAB — TRIGLYCERIDES: TRIGLYCERIDES: 90 mg/dL (ref <150)

## 2017-11-03 LAB — PHOSPHORUS: Phosphorus: 3.2 mg/dL (ref 2.5–4.6)

## 2017-11-03 LAB — ETHANOL: Alcohol, Ethyl (B): <10 mg/dL (ref <10)

## 2017-11-03 MED ORDER — ONDANSETRON HCL 4 MG PO TABS: 4.0000 mg | ORAL_TABLET | Freq: Four times a day (QID) | ORAL | Status: DC | PRN

## 2017-11-03 MED ORDER — ACETAMINOPHEN 650 MG RE SUPP: 650.0000 mg | Freq: Four times a day (QID) | RECTAL | Status: DC | PRN

## 2017-11-03 MED ORDER — ENOXAPARIN SODIUM 40 MG/0.4ML ~~LOC~~ SOLN
40.0000 mg | SUBCUTANEOUS | Status: DC
  Administered 2017-11-05: 40 mg via SUBCUTANEOUS
  Filled 2017-11-03 (×2): qty 0.4

## 2017-11-03 MED ORDER — HALOPERIDOL LACTATE 5 MG/ML IJ SOLN: 2.0000 mg | Freq: Four times a day (QID) | INTRAMUSCULAR | Status: DC | PRN

## 2017-11-03 MED ORDER — SODIUM CHLORIDE 0.9 % IV BOLUS
1000.0000 mL | Freq: Once | INTRAVENOUS | Status: AC
  Administered 2017-11-03: 1000 mL via INTRAVENOUS

## 2017-11-03 MED ORDER — VANCOMYCIN HCL IN DEXTROSE 1-5 GM/200ML-% IV SOLN
1000.0000 mg | Freq: Once | INTRAVENOUS | Status: AC
  Administered 2017-11-03: 1000 mg via INTRAVENOUS
  Filled 2017-11-03: qty 200

## 2017-11-03 MED ORDER — FENTANYL BOLUS VIA INFUSION
25.0000 ug | INTRAVENOUS | Status: DC | PRN
  Filled 2017-11-03: qty 25

## 2017-11-03 MED ORDER — DOCUSATE SODIUM 50 MG/5ML PO LIQD: 100.0000 mg | Freq: Two times a day (BID) | ORAL | Status: DC | PRN

## 2017-11-03 MED ORDER — LORAZEPAM 2 MG/ML IJ SOLN
2.0000 mg | INTRAMUSCULAR | Status: DC | PRN
  Administered 2017-11-03: 3 mg via INTRAVENOUS
  Filled 2017-11-03: qty 2

## 2017-11-03 MED ORDER — SODIUM CHLORIDE 0.9 % IV SOLN
20.0000 mL/kg | Freq: Once | INTRAVENOUS | Status: AC
  Administered 2017-11-03: 1694 mL via INTRAVENOUS

## 2017-11-03 MED ORDER — NALOXONE HCL 2 MG/2ML IJ SOSY
1.0000 mg | PREFILLED_SYRINGE | Freq: Once | INTRAMUSCULAR | Status: AC
  Administered 2017-11-03: 1 mg via INTRAVENOUS
  Filled 2017-11-03: qty 2

## 2017-11-03 MED ORDER — ACETAMINOPHEN 325 MG PO TABS
650.0000 mg | ORAL_TABLET | Freq: Four times a day (QID) | ORAL | Status: DC | PRN
  Administered 2017-11-04 – 2017-11-06 (×2): 650 mg via ORAL
  Filled 2017-11-03 (×2): qty 2

## 2017-11-03 MED ORDER — FENTANYL CITRATE (PF) 100 MCG/2ML IJ SOLN: 50.0000 ug | Freq: Once | INTRAMUSCULAR | Status: DC

## 2017-11-03 MED ORDER — SODIUM CHLORIDE 0.9 % IV SOLN: 2.0000 g | INTRAVENOUS | Status: DC

## 2017-11-03 MED ORDER — SODIUM CHLORIDE 0.9 % IV SOLN
250.0000 mL | INTRAVENOUS | Status: DC | PRN
  Administered 2017-11-04: 250 mL via INTRAVENOUS

## 2017-11-03 MED ORDER — METRONIDAZOLE IN NACL 5-0.79 MG/ML-% IV SOLN
500.0000 mg | Freq: Three times a day (TID) | INTRAVENOUS | Status: DC
  Administered 2017-11-03 – 2017-11-04 (×3): 500 mg via INTRAVENOUS
  Filled 2017-11-03 (×5): qty 100

## 2017-11-03 MED ORDER — PROPOFOL 1000 MG/100ML IV EMUL
0.0000 ug/kg/min | INTRAVENOUS | Status: DC
  Administered 2017-11-03: 10 ug/kg/min via INTRAVENOUS
  Filled 2017-11-03 (×2): qty 100

## 2017-11-03 MED ORDER — FENTANYL 2500MCG IN NS 250ML (10MCG/ML) PREMIX INFUSION
25.0000 ug/h | INTRAVENOUS | Status: DC
  Administered 2017-11-03: 50 ug/h via INTRAVENOUS
  Filled 2017-11-03: qty 250

## 2017-11-03 MED ORDER — PIPERACILLIN-TAZOBACTAM 3.375 G IVPB 30 MIN
3.3750 g | Freq: Once | INTRAVENOUS | Status: AC
  Administered 2017-11-03: 3.375 g via INTRAVENOUS
  Filled 2017-11-03: qty 50

## 2017-11-03 MED ORDER — ONDANSETRON HCL 4 MG/2ML IJ SOLN: 4.0000 mg | Freq: Four times a day (QID) | INTRAMUSCULAR | Status: DC | PRN

## 2017-11-03 MED ORDER — SODIUM CHLORIDE 0.9 % IV SOLN
1250.0000 mg | Freq: Two times a day (BID) | INTRAVENOUS | Status: DC
  Administered 2017-11-03 – 2017-11-04 (×2): 1250 mg via INTRAVENOUS
  Filled 2017-11-03 (×2): qty 1250

## 2017-11-03 MED ORDER — SODIUM CHLORIDE 0.9 % IV SOLN
2.0000 g | Freq: Two times a day (BID) | INTRAVENOUS | Status: DC
  Administered 2017-11-03: 2 g via INTRAVENOUS
  Filled 2017-11-03: qty 20
  Filled 2017-11-03: qty 2

## 2017-11-03 MED ORDER — PROPOFOL 1000 MG/100ML IV EMUL
INTRAVENOUS | Status: AC
  Administered 2017-11-03: 10 ug/kg/min via INTRAVENOUS
  Filled 2017-11-03: qty 100

## 2017-11-03 MED ORDER — PANTOPRAZOLE SODIUM 40 MG PO PACK
40.0000 mg | PACK | Freq: Every day | ORAL | Status: DC
  Administered 2017-11-04: 40 mg
  Filled 2017-11-03: qty 20

## 2017-11-03 MED ORDER — DEXMEDETOMIDINE HCL IN NACL 200 MCG/50ML IV SOLN
0.2000 ug/kg/h | INTRAVENOUS | Status: DC
  Administered 2017-11-03: 0.2 ug/kg/h via INTRAVENOUS
  Administered 2017-11-04: 0.5 ug/kg/h via INTRAVENOUS
  Filled 2017-11-03 (×2): qty 50

## 2017-11-03 MED ORDER — THIAMINE HCL 100 MG/ML IJ SOLN
Freq: Once | INTRAVENOUS | Status: AC
  Administered 2017-11-03: 21:00:00 via INTRAVENOUS
  Filled 2017-11-03: qty 1000

## 2017-11-03 MED ORDER — NALOXONE HCL 2 MG/2ML IJ SOSY
1.0000 mg | PREFILLED_SYRINGE | Freq: Once | INTRAMUSCULAR | Status: DC
Start: 2017-11-03 — End: 2017-11-03

## 2017-11-03 MED ORDER — SODIUM CHLORIDE 0.9 % IV SOLN
INTRAVENOUS | Status: DC
  Administered 2017-11-03 – 2017-11-05 (×5): via INTRAVENOUS

## 2017-11-03 NOTE — Progress Notes (Signed)
Pharmacy Antibiotic Note  Alex Potter is a 22 y.o. male admitted on 11/03/2017.  Was found unresponsive in his car.  Hx of substance abuse  Pharmacy has been consulted for Vancomycin dosing, Rocephin added for r/o meningitis, Flagyl for anaerobic coverage, probable aspiration. Plan Lumbar puncture  Plan: Vancomycin 1gm in ED, then follow with 1250mg  q12, dose for r/o meningitis Rocephin 2gm ordered q24hr per MD, should be q12 with concern for meningitis, schedule changed Flagyl 500mg  IV q8 per MD   Temp (24hrs), Avg:100.3 F (37.9 C), Min:99.9 F (37.7 C), Max:101 F (38.3 C)  Recent Labs  Lab 11/03/17 0925 11/03/17 0930 11/03/17 0934 11/03/17 1224  WBC 19.5*  --   --   --   CREATININE 1.10 1.00  --   --   LATICACIDVEN  --   --  5.27* 2.97*    CrCl cannot be calculated (Unknown ideal weight.).    Allergies  Allergen Reactions  . Mushroom Extract Complex Itching and Swelling   Antimicrobials this admission: 6/23 Vancomycin >>  6/23 Rocephin >>  6/23 Flagyl >>  Dose adjustments this admission:  Microbiology results: 6/23 BCx: sent 6/23 U/A: unremarkable  Thank you for allowing pharmacy to be a part of this patient's care.  Otho BellowsGreen, Sholonda Jobst L PharmD Pager [phone] 11/03/2017, 2:54 PM

## 2017-11-03 NOTE — H&P (Addendum)
History and Physical    Alex Potter JYN:[id number] DOB: 06-Jan-1996 DOA: 11/03/2017  PCP: Patient, No Pcp Per  Patient coming from: Found unresponsive in car  Chief Complaint: Found unresponsive in car   HPI: Alex Potter is a 22 y.o. male with medical history significant of drug and alcohol abuse who presents after being found unresponsive in his car. He was given narcan x2 with improvement in alertness. However, he remains nonverbal in the ED and unable to provide any history. No family at bedside.   When he arrived in the ED, he was in respiratory distress, required nonrebreather mask. After receiving narcan and he became more alert, his O2 requirement decreased and he was deescalated to 3L [place] O2. He was found to be febrile 101F, tachycardic HR 110, tachypneic RR 31. WBC 19.5, lactic acid 5.27. UDS positive for THC but negative for opiates. UA negative. CT head negative for acute injury. CXR negative. He was started on empiric vanco/zosyn for concern for aspiration as he was found with vomit on scene of EMS arrival.    Addendum: I was able to get ahold of patient's parents over the phone after admission. They are currently traveling and in [place] for a wedding. Patient had been residing at [facility] (halfway house) after drug cessation tx at [facility]. To their knowledge, patient had not been using alcohol or other drugs since finishing treatment program. They last spoke to him on Thursday 6/20 and at that time, he had not complained of anything in specific. He has been working for [employer].    Past Medical History:  Diagnosis Date  . Drug abuse (HCC)   . Tobacco abuse     Past Surgical History:  Procedure Laterality Date  . CHOLECYSTECTOMY N/A 07/05/2017   Procedure: LAPAROSCOPIC CHOLECYSTECTOMY WITH INTRAOPERATIVE CHOLANGIOGRAM;  Surgeon: Jimmye NormanWyatt, Marguerite, MD;  [facility];  Service: General;  Laterality: N/A;  . ERCP N/A 07/04/2017   Procedure: ENDOSCOPIC RETROGRADE  CHOLANGIOPANCREATOGRAPHY (ERCP);  Surgeon: Sherrilyn Ristanis, Henry L III, MD;  [facility];  Service: Gastroenterology;  Laterality: N/A;  . INGUINAL HERNIA REPAIR Right      reports that he has been smoking.  He has never used smokeless tobacco. He reports that he drinks alcohol. He reports that he has current or past drug history. Drugs: Marijuana and Methamphetamines.  Allergies  Allergen Reactions  . Mushroom Extract Complex Itching and Swelling    No family history on file.  Prior to Admission medications   Medication Sig Start Date End Date Taking? Authorizing Provider  alum & mag hydroxide-simeth (MAALOX/MYLANTA) 200-200-20 MG/5ML suspension Take 15-30 mLs by mouth as needed for indigestion or heartburn.    [provider]  Benzocaine 15 MG LOZG Use as directed 1 each in the mouth or throat every 4 (four) hours as needed (for sore throat).    [provider]  benzonatate (TESSALON) 100 MG capsule Take 200 mg by mouth 3 (three) times daily as needed for cough.    [provider]  cetirizine (ZYRTEC) 10 MG tablet Take 10 mg by mouth daily as needed for allergies.    [provider]  DULoxetine (CYMBALTA) 30 MG capsule Take 1 capsule (30 mg total) by mouth daily. 07/09/17   Narda BondsNettey, Ralph A, MD  Fructose-Dextrose-Phosphor Acd (NAUSEA CONTROL) 1.87-1.87-21.5 SOLN Take 5-10 mLs by mouth every 15 (fifteen) minutes as needed (for nausea).    [provider]  guaiFENesin (MUCINEX) 600 MG 12 hr tablet Take 600 mg by  mouth 2 (two) times daily as needed for cough or to loosen phlegm.    [provider]  ibuprofen (ADVIL,MOTRIN) 600 MG tablet Take 600 mg by mouth every 6 (six) hours as needed for fever or mild pain.    [provider]  loperamide (IMODIUM A-D) 2 MG tablet Take 2 mg by mouth 4 (four) times daily as needed for diarrhea or loose stools.    [provider]  Melatonin 5 MG TABS Take 5-10 mg by mouth at bedtime as  needed (for insomnia).    [provider]  methocarbamol (ROBAXIN) 500 MG tablet Take 1 tablet (500 mg total) by mouth every 8 (eight) hours as needed for muscle spasms. 07/09/17   Narda Bonds, MD  Multiple Vitamin (MULTIVITAMIN WITH MINERALS) TABS tablet Take 1 tablet by mouth daily.    [provider]  ondansetron (ZOFRAN) 8 MG tablet Take 8 mg by mouth 2 (two) times daily as needed for nausea or vomiting.    [provider]    Physical Exam: Vitals:   11/03/17 1030 11/03/17 1100 11/03/17 1115 11/03/17 1145  BP: 136/79 134/77 (!) 152/87 (!) 141/77  Pulse: 81 71 (!) 109 82  Resp:   (!) 36 (!) 28  Temp: 99.9 F (37.7 C) 100 F (37.8 C) 100 F (37.8 C) (!) 100.4 F (38 C)  TempSrc:      SpO2: 93% 90% 91% 94%    Constitutional: Calm, resting, in 4-point restraints, when aroused, he becomes alert but nonverbal, does not follow commands. He becomes agitated with verbal stimuli.  Eyes: PERRL, lids and conjunctivae normal ENMT: Mucous membranes are moist. Neck: normal, supple, no masses, no thyromegaly Respiratory: Tachypneic, On 3L [place] O2, diffuse rhonchi. Normal respiratory effort. No accessory muscle use.  Cardiovascular: Regular rate and rhythm, no murmurs / rubs / gallops. No extremity edema.  Abdomen: no tenderness, soft to palpation, does not grimace with palpation, no masses palpated. No hepatosplenomegaly. Bowel sounds positive.  Musculoskeletal: no clubbing / cyanosis. No joint deformity upper and lower extremities. Good ROM, no contractures. Normal muscle tone. Unable to test passive neck flexion due to patient being noncooperative  Skin: no rashes, lesions, ulcers on exposed skin  Neurologic: Alert to voice but does not follow commands   Labs on Admission: I have personally reviewed following labs and imaging studies  CBC: Recent Labs  Lab 11/03/17 0925 11/03/17 0930  WBC 19.5*  --   NEUTROABS 16.1*  --   HGB 14.5 15.6  HCT 45.3 46.0  MCV  81.6  --   PLT 308  --    Basic Metabolic Panel: Recent Labs  Lab 11/03/17 0925 11/03/17 0930  NA 146* 145  K 4.0 4.1  CL 108 106  CO2 24  --   GLUCOSE 125* 119*  BUN 15 15  CREATININE 1.10 1.00  CALCIUM 9.5  --    GFR: CrCl cannot be calculated (Unknown ideal weight.). Liver Function Tests: Recent Labs  Lab 11/03/17 0925  AST 117*  ALT 139*  ALKPHOS 77  BILITOT 0.4  PROT 7.8  ALBUMIN 4.0   No results for input(s): LIPASE, AMYLASE in the last 168 hours. No results for input(s): AMMONIA in the last 168 hours. Coagulation Profile: No results for input(s): INR, PROTIME in the last 168 hours. Cardiac Enzymes: No results for input(s): CKTOTAL, CKMB, CKMBINDEX, TROPONINI in the last 168 hours. BNP (last 3 results) No results for input(s): PROBNP in the last 8760 hours. HbA1C:  No results for input(s): HGBA1C in the last 72 hours. CBG: No results for input(s): GLUCAP in the last 168 hours. Lipid Profile: No results for input(s): CHOL, HDL, LDLCALC, TRIG, CHOLHDL, LDLDIRECT in the last 72 hours. Thyroid Function Tests: No results for input(s): TSH, T4TOTAL, FREET4, T3FREE, THYROIDAB in the last 72 hours. Anemia Panel: No results for input(s): VITAMINB12, FOLATE, FERRITIN, TIBC, IRON, RETICCTPCT in the last 72 hours. Urine analysis:    Component Value Date/Time   COLORURINE YELLOW 11/03/2017 1020   APPEARANCEUR CLEAR 11/03/2017 1020   LABSPEC 1.020 11/03/2017 1020   PHURINE 5.0 11/03/2017 1020   GLUCOSEU 50 (A) 11/03/2017 1020   HGBUR MODERATE (A) 11/03/2017 1020   BILIRUBINUR NEGATIVE 11/03/2017 1020   KETONESUR NEGATIVE 11/03/2017 1020   PROTEINUR NEGATIVE 11/03/2017 1020   NITRITE NEGATIVE 11/03/2017 1020   LEUKOCYTESUR NEGATIVE 11/03/2017 1020   Sepsis Labs: !!!!!!!!!!!!!!!!!!!!!!!!!!!!!!!!!!!!!!!!!!!! @LABRCNTIP (procalcitonin:4,lacticidven:4) )No results found for this or any previous visit (from the past 240 hour(s)).   Radiological Exams on  Admission: Ct Head Wo Contrast  Result Date: 11/03/2017 CLINICAL DATA:  Found in car unresponsive. EXAM: CT HEAD WITHOUT CONTRAST TECHNIQUE: Contiguous axial images were obtained from the base of the skull through the vertex without intravenous contrast. COMPARISON:  None. FINDINGS: Brain: Complete effacement of the cerebral sulci may be related to the patient's young age and robust baseline brain volume as gray-white differentiation appears preserved throughout and there is no frank herniation to suggest the presence of diffuse cerebral edema. There is no evidence of acute infarct, intracranial hemorrhage, mass, or extra-axial fluid collection. Vascular: No hyperdense vessel. Skull: No fracture or focal osseous lesion. Sinuses/Orbits: Visualized paranasal sinuses and mastoid air cells are clear. Orbits are unremarkable. Other: None. IMPRESSION: 1. No definite acute intracranial abnormality identified. 2. Paucity of CSF without additional findings of diffuse hypoxic-ischemic injury/cerebral edema. These results were called by telephone at the time of interpretation on 11/03/2017 at 11:15 am to Dr. Bethann Berkshire , who verbally acknowledged these results. Electronically Signed   By: Sebastian Ache M.D.   On: 11/03/2017 11:39   Dg Chest Port 1 View  Result Date: 11/03/2017 CLINICAL DATA:  Patient found unresponsive. EXAM: PORTABLE CHEST 1 VIEW COMPARISON:  None. FINDINGS: Lungs are adequately inflated and otherwise clear. Cardiomediastinal silhouette, bones and soft tissues are normal. IMPRESSION: No active disease. Electronically Signed   By: Elberta Fortis M.D.   On: 11/03/2017 10:42    EKG: Independently reviewed. Sinus tachycardia   Assessment/Plan Principal Problem:   Acute metabolic encephalopathy Active Problems:   Drug abuse (HCC)   Sepsis (HCC)   Acute respiratory failure with hypoxia (HCC)   Acute metabolic encephalopathy -Became alert after narcan, although remains nonverbal. UDS negative  for opiates. Check for synthetic, semi-synthetic opioid drug screen, which may not show up on standard urine drug screen, send out misc lab ordered -CT head negative for acute intracranial abnormality  -Spoke with Dr. Lyman Bishop Neurology. We discussed his case, he reviewed CT head, recommending MRI MRV stat  Severe sepsis secondary to ?aspiration pneumonia vs ?meningitis  -Presented with fever, tachycardia, tachypnea, leukocytosis, respiratory distress, vomit on scene by EMS  -CXR without acute process, but he was found with large amount of vomit upon EMS arrival. Repeat CXR in AM after hydration -Blood cultures pending -Trend lactic acid  -Empiric vanco, rocephin until meningitis ruled out, will also add flagyl for anaerobe coverage due to concern for aspiration  -Check LP. Patient combative with arousal, will order for fluoro-guided  LP  -Abdominal exam is benign, will hold off on abdominal imaging for now  -IVF   Acute hypoxemic respiratory failure -Initially required non-rebreather mask. Currently on 3L [place] O2  -Currently protecting airway  -Wean as able   History of alcohol and drug abuse -Unclear last alcohol use. EMS reported strong alcohol smell w hen they found him, his alcohol level was undectable on admission. Parents state that his drug of choice has been THC and alcohol, no previous known opiate abuse  -CIWA     DVT prophylaxis: Lovenox Code Status: Full   Family Communication: No family at bedside, spoke with parents over the phone  Disposition Plan: Pending improvement, work up  [place] called: None  Admission status: Inpatient   * I certify that at the point of admission it is my clinical judgment that the patient will require inpatient hospital care spanning beyond 2 midnights from the point of admission due to high intensity of service, high risk for further deterioration and high frequency of surveillance required.*   Noralee Stain, DO [facility] [email or web address] Password [facility] 11/03/2017, 12:36 PM

## 2017-11-03 NOTE — ED Notes (Signed)
Pt is  MRI

## 2017-11-03 NOTE — Consult Note (Addendum)
.. ..  Name: Alex Potter MRN: [id number] DOB: 1995/07/16    ADMISSION DATE:  11/03/2017 CONSULTATION DATE:  11/03/17  REFERRING MD :  NEUROLOGYLorraine Lax MD  CHIEF COMPLAINT:  Altered mental status  BRIEF PATIENT DESCRIPTION: 22 yr old male with PMHx sig polysubstance abuse presented on 11/03/17 to [facility] by [facility] for AMS workup found on CTH to have diffuse hypoxic-ischemic injury/cerebral edema. Neuro recommended MRI/MRV with EEG to follow. Pt agitated and requiring intubation prior to scan. PCCM consulted.  SIGNIFICANT EVENTS   Agitated in 4 point restraints s/p Ativan  STUDIES:  CTH:  FINDINGS: Brain: Complete effacement of the cerebral sulci may be related to the patient's young age and robust baseline brain volume as gray-white differentiation appears preserved throughout and there is no frank herniation to suggest the presence of diffuse cerebral edema. There is no evidence of acute infarct, intracranial hemorrhage, mass, or extra-axial fluid collection. Vascular: No hyperdense vessel. Skull: No fracture or focal osseous lesion. Sinuses/Orbits: Visualized paranasal sinuses and mastoid air cells are clear. Orbits are unremarkable.    HISTORY OF PRESENT ILLNESS: ( history obtained from parents, review of the EMR and accounts from other providers) 22 yr old male with PMHx significant for polysubstance abuse and ETOH use after discussion with the parents pt was in a [place] halfway house for some time around Feb- March after recently being treated at [facility].  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 The father is concerned that he may have relapsed and started drinking again.  Per the documentation from the EDP and TRH: Pt was found unresponsive in his car with evidence of vomit around his mouth.  He received Narcan x 2 and had some improvement in his alertness.  He was febrile and sob on presentation    PCCM was called to intubate patient prior to MRI/MRV requested by neurology.  On my evaluation pt is awake and somnolent in four point restraints but opens eyes to his name. He c/o lower back pain but denies any other complaints.  He is able to answer some questions but does not remember the details prior to his admission. He is actively chattering with fasciculations of his tongue Vitals on my eval : systolic 510-258N HR >277 Sat >90 on 3L [place] + productive cough  PAST MEDICAL HISTORY :   has a past medical history of Drug abuse (Terry) and Tobacco abuse.  has a past surgical history that includes Inguinal hernia repair (Right); ERCP (N/A, 07/04/2017); and Cholecystectomy (N/A, 07/05/2017). Prior to Admission medications   Medication Sig Start Date End Date Taking? Authorizing Provider  alum & mag hydroxide-simeth (MAALOX/MYLANTA) 200-200-20 MG/5ML suspension Take 15-30 mLs by mouth as needed for indigestion or heartburn.    [provider]  Benzocaine 15 MG LOZG Use as directed 1 each in the mouth or throat every 4 (four) hours as needed (for sore throat).    [provider]  benzonatate (TESSALON) 100 MG capsule Take 200 mg by mouth 3 (three) times daily as needed for cough.    [provider]  cetirizine (ZYRTEC) 10 MG tablet Take 10 mg by mouth daily as needed for allergies.    [provider]  DULoxetine (CYMBALTA) 30 MG capsule Take 1 capsule (30 mg total) by mouth daily. 07/09/17   Mariel Aloe, MD  Fructose-Dextrose-Phosphor Acd (NAUSEA CONTROL) 1.87-1.87-21.5 SOLN Take 5-10 mLs by mouth every 15 (  fifteen) minutes as needed (for nausea).    [provider]  guaiFENesin (MUCINEX) 600 MG 12 hr tablet Take 600 mg by mouth 2 (two) times daily as needed for cough or to loosen phlegm.    [provider]  ibuprofen (ADVIL,MOTRIN) 600 MG tablet Take 600 mg by mouth every 6 (six) hours as needed for fever or mild pain.    [provider]  loperamide (IMODIUM A-D) 2 MG tablet Take 2 mg by mouth 4 (four) times daily as needed for diarrhea or loose stools.    [provider]  Melatonin 5 MG TABS Take 5-10 mg by mouth at bedtime as needed (for insomnia).    [provider]  methocarbamol (ROBAXIN) 500 MG tablet Take 1 tablet (500 mg total) by mouth every 8 (eight) hours as needed for muscle spasms. 07/09/17   Mariel Aloe, MD  Multiple Vitamin (MULTIVITAMIN WITH MINERALS) TABS tablet Take 1 tablet by mouth daily.    [provider]  ondansetron (ZOFRAN) 8 MG tablet Take 8 mg by mouth 2 (two) times daily as needed for nausea or vomiting.    [provider]   Allergies  Allergen Reactions  . Mushroom Extract Complex Itching and Swelling    FAMILY HISTORY:  family history is not on file. SOCIAL HISTORY:  reports that he has been smoking.  He has never used smokeless tobacco. He reports that he drinks alcohol. He reports that he has current or past drug history. Drugs: Marijuana and Methamphetamines.  REVIEW OF SYSTEMS:  ( bolded items are pertinent positives) Constitutional: Negative for fever, chills, weight loss, malaise/fatigue and diaphoresis.  HENT: Negative for hearing loss, ear pain, nosebleeds, congestion, sore throat, neck pain, tinnitus and ear discharge.   Eyes: Negative for blurred vision, double vision, photophobia, pain, discharge and redness.  Respiratory: Negative for cough, hemoptysis, sputum production, shortness of breath, wheezing and stridor.   Cardiovascular: Negative for chest pain, palpitations, orthopnea, claudication, leg swelling and PND.  Gastrointestinal: Negative for heartburn, nausea, vomiting, abdominal pain, diarrhea, constipation, blood in stool and melena.  Genitourinary: Negative for dysuria, urgency, frequency, hematuria and flank pain.  Musculoskeletal: denies neck pain Negative for myalgias, lower back pain, joint pain and falls.  Skin: Negative for  itching and rash.  Neurological: Negative for dizziness, tingling, tremors, sensory change, speech change, focal weakness, seizures, loss of consciousness, weakness and headaches.  Endo/Heme/Allergies: Negative for environmental allergies and polydipsia. Does not bruise/bleed easily.  SUBJECTIVE:   VITAL SIGNS: Temp:  [99.1 F (37.3 C)-101 F (38.3 C)] 99.1 F (37.3 C) (06/23 1633) Pulse Rate:  [71-169] 89 (06/23 1730) Resp:  [17-36] 20 (06/23 1730) BP: (118-160)/(71-96) 154/71 (06/23 1730) SpO2:  [84 %-100 %] 97 % (06/23 1730) FiO2 (%):  [100 %] 100 % (06/23 2000) Weight:  [73.9 kg (163 lb)-84.7 kg (186 lb 11.7 oz)] 84.7 kg (186 lb 11.7 oz) (06/23 1633)  PHYSICAL EXAMINATION: General:  Well nourished male in moderate distress Neuro:  Awake, somnolent, answers questions slowly, follows commands, moving all extremities no appreciable deficits HEENT:  Normocephalic, chipped tooth noted, evidence of aspirate in oropharynx Cardiovascular:  S1 and S2 tachy no appreciable rub or murmur Lungs:  Coarse breath sounds bilaterally, monophonic wheeze audible without stethoscope, + cough Abdomen:  Soft, non tender non distended abdomen Musculoskeletal:  No gross deformities Skin:  Tattoos (most recent - is not erythematous on right wrist)  Recent Labs  Lab 11/03/17 0925 11/03/17 0930  NA 146* 145  K 4.0 4.1  CL 108 106  CO2 24  --   BUN 15 15  CREATININE 1.10 1.00  GLUCOSE 125* 119*   Recent Labs  Lab 11/03/17 0925 11/03/17 0930  HGB 14.5 15.6  HCT 45.3 46.0  WBC 19.5*  --   PLT 308  --    Ct Head Wo Contrast  Result Date: 11/03/2017 CLINICAL DATA:  Found in car unresponsive. EXAM: CT HEAD WITHOUT CONTRAST TECHNIQUE: Contiguous axial images were obtained from the base of the skull through the vertex without intravenous contrast. COMPARISON:  None. FINDINGS: Brain: Complete effacement of the cerebral sulci may be related to the patient's young age and robust baseline brain volume  as gray-white differentiation appears preserved throughout and there is no frank herniation to suggest the presence of diffuse cerebral edema. There is no evidence of acute infarct, intracranial hemorrhage, mass, or extra-axial fluid collection. Vascular: No hyperdense vessel. Skull: No fracture or focal osseous lesion. Sinuses/Orbits: Visualized paranasal sinuses and mastoid air cells are clear. Orbits are unremarkable. Other: None. IMPRESSION: 1. No definite acute intracranial abnormality identified. 2. Paucity of CSF without additional findings of diffuse hypoxic-ischemic injury/cerebral edema. These results were called by telephone at the time of interpretation on 11/03/2017 at 11:15 am to Dr. Milton Ferguson , who verbally acknowledged these results. Electronically Signed   By: Logan Bores M.D.   On: 11/03/2017 11:39   Dg Chest Port 1 View  Result Date: 11/03/2017 CLINICAL DATA:  Patient found unresponsive. EXAM: PORTABLE CHEST 1 VIEW COMPARISON:  None. FINDINGS: Lungs are adequately inflated and otherwise clear. Cardiomediastinal silhouette, bones and soft tissues are normal. IMPRESSION: No active disease. Electronically Signed   By: Marin Olp M.D.   On: 11/03/2017 10:42    ASSESSMENT / PLAN: NEURO: Acute metabolic encephalopathy H/o polysubstance abuse UDS positive for THC Pt did respond to Narcan per EDP- no Opiate on UDS ( doesn't eliminate possibility of a synthetic) GCS prior to intubation was 15 Now on propofol ggt  Did receive some fentanyl post intubation but stopped given polysubstance history RASS goal -1 to -2 CTH showed complete effacement of the cerebral sulci may be related to the patient's young age and robust baseline brain volume as gray-white differentiation appears preserved throughout and there is no frank herniation to suggest the presence of diffuse cerebral edema. MRI/MRV pending  Neurology evaluated patient prior to intubation and RSI Per my conversation with  Aroor MD pt will be trx to [facility] Neuro ICU  ETOH withdrawal? - given h/o HTN, tachy with fasciculations and agitated No hallucinations on my exam CIWA protocol Started on Precedex infusion   CARDIAC: Hemodynamically stable Hypertensive on my evaluation and tachycardic May be secondary to ETOH withdrawal MAP goal >10mHg Trend Lactate ( may be secondary to medications/substances rather than hypoperfusion) ECHO to assess LVEF  PULMONARY: Acute respiratory failure with hypoxia intubated Started on Mechanical ventilation PRVC TV 8cc/kg  ABG prior to intubation: 7.399/42/78/25 ? Aspiration given altered mental status, evidence in oropharynx and coarse breath sounds Waiting for CXR post intubation Check RVP and Resp cx VAP HOB >30 deg Aspiration precautions Evaluate for SBT in AM if appropiate start wean on 6/24 AM  ID: No confirmed source? Possible Aspiration Penumonia? Lactic Acidosis Leukocytosis 19.5 Cultures: negative mRSA screen F/u blood cx Given possible diffuse edema on CTH no LP at this time Prev received Vancomycin, Zosyn , Ceftriaxone and Flagyl Continue Vancomycin and zosyn Check PCT Trend WBC, fever curve and lactate  Endocrine:  No H/o insulin dependence Check TSH  GI: No acute issues NPO OGT placed post intubation Pepcid Q 12 for prophylaxis HOB > 30 deg with asp precautions If pt remains intubated for >24 hrs will address TF Transaminases elevated - will trend  Levels of AST/ ALT have been higher in the past Has a h/o ETOh use Last level on admission <10 Bili normal 0.4 and Alk phos 77  Heme: ..hemoglobin 15 ..platelets 308 No signs of active bleeding or h/o coagulopathy Daily CBCs Hgb<7 transfuse PRBCs .Marland KitchenConflict (See Lab Report): O POS/O POS Performed at [facility], [place] [address] [place], [place] [address] DVT PPx-> Lovenox and SCDs  RENAL Baseline Cr No signs of acute kidney injury Indwelling foley in place  UA  negative Monitor Is and Os Anion Gap metabolic acidosis secondary to Lactic acidosis  Lab Results  Component Value Date   CREATININE 1.00 11/03/2017   CREATININE 1.10 11/03/2017   CREATININE 0.90 07/09/2017  replace electrolytes as needed Goals for electrolytes: K+ 4, Mg 2, Phos 2     I, Dr Seward Carol have personally reviewed patient's available data, including medical history, events of note, physical examination and test results as part of my evaluation. I have discussed with neurology and other care providers such as pharmacist, RN and Elink. The patient is critically ill with multiple organ systems failure and requires high complexity decision making for assessment and support, frequent evaluation and titration of therapies, application of advanced monitoring technologies and extensive interpretation of multiple databases. Critical Care Time devoted to patient care services described in this note is 23  Minutes. This time reflects time of care of this signee Dr Seward Carol. This critical care time does not reflect procedure time, or teaching time or supervisory time but could involve care discussion time    DISPOSITION:[facility] CC TIME: 74 mins PROGNOSIS: Guarded FAMILY: aware of intubation, called and discussed transfer and care plan with both parents who are currently in [place] headed back to [place] (Mother [phone]. Father is Dr Nyoka Cowden [phone]) CODE STATUS: Full  Signed Dr Seward Carol Pulmonary Critical Care Locums  11/03/2017, 8:19 PM

## 2017-11-03 NOTE — Progress Notes (Signed)
ABG results placed in EPIC per specimen drawn by Carin HockKristen, D. Scatliffe, M.D. CCM

## 2017-11-03 NOTE — Procedures (Addendum)
Intubation Procedure Note Alex Potter [id number] February 04, 1996  Procedure: Intubation Indications: Airway protection and maintenance  ABG prior to intubation 7.399/42/78/25  Procedure Details Consent: Risks of procedure as well as the alternatives and risks of each were explained to the (patient/caregiver).  Consent for procedure obtained. Time Out: Verified patient identification, verified procedure, site/side was marked, verified correct patient position, special equipment/implants available, medications/allergies/relevent history reviewed, required imaging and test results available.  Performed  Maximum sterile technique was used including gloves, hand hygiene and mask.  MAC and 3   Weight based dosing 87kg  Lidocaine 2%- 158m or 5 cc- 5- 10 mins prior Propofol induction dose 170mml- total of 17cc Rocuronium 5067m0.6mg59m) ETT 7.5 cuffed 23 at lip  Evaluation Hemodynamic Status: BP stable throughout; O2 sats: stable throughout Patient's Current Condition: stable Complications: No apparent complications Patient did tolerate procedure well. Chest X-ray ordered to verify placement.  CXR: pending.   KrisRise Paganinitliffe 11/03/2017

## 2017-11-03 NOTE — Consult Note (Signed)
NEURO HOSPITALIST CONSULT NOTE   Requestig physician: Dr. Alvino Chapel  Reason for Consult: Found unresponsive in car/ not waking up  History obtained from:  Chart  HPI:                                                                                                                                          Koby Pickup is an 22 y.o. male PMH of drug and alcohol abuse. Presented to [facility] ED after being found unresponsive in his car.  Patient is a poor historian due to AMS.  Patient is in "[facility]ship hall" a drug rehabilitation program here in [place]. Originally from [facility]. Patient was found in car unresponsive and was brought to [facility]. Here he received 2 doses of narcan with an improvement in alertness. Patient is waxing an waning between obtunded and moments where he will wake up, but then he becomes agitated and combative. Patient currently with all 4 limbs in restraints.He will tell you his name, and that he is at [facility], but has not managed to say anything else coherently.  WBC elevated 19.5, lactic acid was 2.97, etoh was 0. Glucose was 125. Urine toxicology screen was + for cannabinoids. Temperature was 100.4. Unable to obtain MRI d/t patient agitation. CT head is concerning for edema though read as no acute intracranial abnormality.   Follow up assessment by attending reveals improved mentation in patient, although still significantly altered in a state best described as a combination of sedated and agitated (agitated at baseline, received benzodiazepine which resulted in some sedation). He has significantly increased latency of verbal responses. He is shivering intermittently, which can be seen in opiate withdrawal.   Past Medical History:  Diagnosis Date  . Drug abuse (HCC)   . Tobacco abuse     Past Surgical History:  Procedure Laterality Date  . CHOLECYSTECTOMY N/A 07/05/2017   Procedure: LAPAROSCOPIC CHOLECYSTECTOMY WITH INTRAOPERATIVE CHOLANGIOGRAM;  Surgeon: Jimmye Norman,  MD;  Location: [facility] OR;  Service: General;  Laterality: N/A;  . ERCP N/A 07/04/2017   Procedure: ENDOSCOPIC RETROGRADE CHOLANGIOPANCREATOGRAPHY (ERCP);  Surgeon: Sherrilyn Rist, MD;  Location: [facility] ENDOSCOPY;  Service: Gastroenterology;  Laterality: N/A;  . INGUINAL HERNIA REPAIR Right     Social History:  reports that he has been smoking.  He has never used smokeless tobacco. He reports that he drinks alcohol. He reports that he has current or past drug history. Drugs: Marijuana and Methamphetamines.  Allergies  Allergen Reactions  . Mushroom Extract Complex Itching and Swelling    MEDICATIONS:  Scheduled: . enoxaparin (LOVENOX) injection  40 mg Subcutaneous Q24H   Continuous: . sodium chloride    . cefTRIAXone (ROCEPHIN)  IV    . metronidazole Stopped (11/03/17 1509)  . vancomycin     ZOX:[id number] **OR** acetaminophen, haloperidol lactate, LORazepam, ondansetron **OR** ondansetron (ZOFRAN) IV   ROS:                                                                                                                                       History obtained from unobtainable from patient due to mental status   Blood pressure 118/83, pulse (!) 102, temperature (!) 100.4 F (38 C), resp. rate (!) 24, weight 73.9 kg (163 lb), SpO2 94 %.   General Examination:                                                                                                       Physical Exam  HEENT-  Normocephalic, no lesions, without obvious abnormality.  Normal external eye and conjunctiva.  Opsoclonus not nystagmus noted.  Cardiovascular- S1-S2 audible, pulses palpable throughout   Lungs-no rhonchi or wheezing noted, no excessive working breathing.  Saturations within normal limits on 4L [place] Extremities- Warm, moist and intact Musculoskeletal-no joint tenderness, deformity or  swelling Skin-warm and dry, no hyperpigmentation, vitiligo, or suspicious lesions  Neurological Examination Mental Status: Patient is drowsy. Patient has just received 3 mg of ativan before I arrival d/t severe agitation requiring 4 security officers and 2 police officers and 4 point restraints. Able to wake up to name and light touch, but not able to follow commands or give much information.  Cranial Nerves: Patient able to open eyes spontaneously to voice. Pupils are small, but equally round and reactive. Opsoclonus was noted bilaterally, there was no nystagmus.  Motor: Able to move all 4 extremities spontaneously and to noxious stimuli. Patient does localize and withdraw to noxious stimuli. No tremors or myoclonus seen on examination.  Deep Tendon Reflexes: 2+ and symmetric throughout Plantars: Right: downgoing   Left: downgoing Cerebellar: UTA Gait: UTA  Follow up neurological examination by attending at 7:30 PM: Ment: Improvement from documented prior, with ability to follow simple motor commands and answer simple questions. He is able to recall the month and year with some difficulty. He is shivering intermittently and is agitated with a sedated affect. Poor attention.  CN: Pupils 2 mm and equally reactive. Visual fields grossly intact to bedside confrontation. Face symmetric. Tongue protrudes midline.  Motor: Able to  move all 4 limbs with 5/5 strength.  Sensory: States he can sense cold stimulus bilateral hands. Moves all 4 in response to tactile stimulation.  Reflexes: Hyperreflexia at 3+ is noted in upper and lower extremities bilaterally without asymmetry.  Cerebellar: Has difficulty following commands for testing.    Lab Results: Basic Metabolic Panel: Recent Labs  Lab 11/03/17 0925 11/03/17 0930  NA 146* 145  K 4.0 4.1  CL 108 106  CO2 24  --   GLUCOSE 125* 119*  BUN 15 15  CREATININE 1.10 1.00  CALCIUM 9.5  --     CBC: Recent Labs  Lab 11/03/17 0925  11/03/17 0930  WBC 19.5*  --   NEUTROABS 16.1*  --   HGB 14.5 15.6  HCT 45.3 46.0  MCV 81.6  --   PLT 308  --     Cardiac Enzymes: No results for input(s): CKTOTAL, CKMB, CKMBINDEX, TROPONINI in the last 168 hours.  Lipid Panel: No results for input(s): CHOL, TRIG, HDL, CHOLHDL, VLDL, LDLCALC in the last 168 hours.  Imaging: Ct Head Wo Contrast  Result Date: 11/03/2017 CLINICAL DATA:  Found in car unresponsive. EXAM: CT HEAD WITHOUT CONTRAST TECHNIQUE: Contiguous axial images were obtained from the base of the skull through the vertex without intravenous contrast. COMPARISON:  None. FINDINGS: Brain: Complete effacement of the cerebral sulci may be related to the patient's young age and robust baseline brain volume as gray-white differentiation appears preserved throughout and there is no frank herniation to suggest the presence of diffuse cerebral edema. There is no evidence of acute infarct, intracranial hemorrhage, mass, or extra-axial fluid collection. Vascular: No hyperdense vessel. Skull: No fracture or focal osseous lesion. Sinuses/Orbits: Visualized paranasal sinuses and mastoid air cells are clear. Orbits are unremarkable. Other: None. IMPRESSION: 1. No definite acute intracranial abnormality identified. 2. Paucity of CSF without additional findings of diffuse hypoxic-ischemic injury/cerebral edema. These results were called by telephone at the time of interpretation on 11/03/2017 at 11:15 am to Dr. Bethann BerkshireJOSEPH ZAMMIT , who verbally acknowledged these results. Electronically Signed   By: Sebastian AcheAllen  Grady M.D.   On: 11/03/2017 11:39   Dg Chest Port 1 View  Result Date: 11/03/2017 CLINICAL DATA:  Patient found unresponsive. EXAM: PORTABLE CHEST 1 VIEW COMPARISON:  None. FINDINGS: Lungs are adequately inflated and otherwise clear. Cardiomediastinal silhouette, bones and soft tissues are normal. IMPRESSION: No active disease. Electronically Signed   By: Elberta Fortisaniel  Boyle M.D.   On: 11/03/2017 10:42    History and exam documented by Valentina LucksJessica Williams, MSN, NP-C, [facility] [phone]   Impression:  Roque LiasJames Pascucci is an 22 y.o. male PMH of drug and alcohol abuse. [facility] aftering being found unresponsive in his car. Etoh negative, drug screen positive for cannabinoids. Responded to Narcan. Now shivering intermittently, a finding which can be seen with opiate withdrawal. CT head was concerning for possible edema, but most likely a false positive as his current exam is not congruent with diffuse cerebral edema. Will reassess with MRI which will be done after he is intubated.   Agitated delirium- ddx toxic effects of controlled substance ingestion, possibly in combination with drug withdrawal, toxic/metabolic, post-ictal psychosis. CNS infection such as meningitis or encephalitis is also on DDx but no neck stiffness or other signs of meningismus on exam.  # Leukocytosis with fever- d/t elevated lactic acid, unknown source infection- sepsis is being r/o .LP possibly indicated after MRI.  # Opsoclonus unusual finding, no myoclonus appreciated concerning for HIE or  cerebral edema. Opsoclonus is resolved at time of repeat exam by neurology attending.  Recommendations: -- MRI is being obtained following intubation of patient -- EEG -- LP (Hold off on LP until further imaging with MRI as there is risk of herniation if cerebral edema suspected to be present on CT is confirmed by MRI). If no cerebral edema on MRI can consider LP. However, would reassess for meningismus, obtain repeat CBC and assess if temperature has returned to baseline prior to making decision to LP, as repeat neurological exam is significantly improved relative to initial assessment.  -- Supportive care  35 minutes spent in the neurological evaluation and management of this critically ill patient.   I have seen and examined the patient. I have amended the assessment and plan above.  Electronically signed: Dr.  Caryl Pina 11/03/2017, 4:45 PM

## 2017-11-03 NOTE — Progress Notes (Signed)
[facility] at this time.

## 2017-11-03 NOTE — Progress Notes (Signed)
Pt transported to MRI without event.  RT will continue to monitor.

## 2017-11-03 NOTE — Progress Notes (Signed)
eLink Physician-Brief Progress Note Patient Name: Roque LiasJames Escudero DOB: 08/05/1995 MRN: [id number]   Date of Service  11/03/2017  HPI/Events of Note  22 yr old with h/o polysubstance abuse recently released from rehab, was sitting in a running care fo an unknonwn amt of time p/w ams [facility] to have possible cerebral edema spoke with Neuro concern for acute metabolic encephalopathy secondary to intoxication ( unknown substance) PCCM originally Consulted for elective intubation prior to MRI in agitated patient. After discussing with Neuro and evaluating patient. LA 7. Hypertensive and [facility] and [facility]. Already seen by PCCM. VSS.  eICU Interventions  No new orders.     Intervention Category Evaluation Type: New Patient Evaluation  Lenell AntuSommer,Steven Eugene 11/03/2017, 10:38 PM

## 2017-11-03 NOTE — ED Notes (Signed)
Bed: RESB Expected date:  Expected time:  Means of arrival:  Comments: Unresponsive

## 2017-11-03 NOTE — Progress Notes (Signed)
   Spoke with neurology Dr. Laurence SlateAroor. He is recommending elective intubation, sedation in order to undergo MRI, MRV. Spoke with Dr. Laure KidneyMaslonka PCCM who will perform intubation for MRI this evening. Unit RN updated regarding plan.   I also updated patient's parents over the phone. They verbalized consent for intubation. Dr. Chilton SiGreen (patient's father, who is an addiction psychiatrist) plans on flying out of [place] tomorrow morning and will be in [place] tomorrow evening. He requests updates to his cell phone with permission to leave information on voicemail.    Noralee StainJennifer Orvie Caradine, DO [facility] [email or web address] Password [email or web address] 11/03/2017, 5:32 PM

## 2017-11-03 NOTE — ED Triage Notes (Signed)
EMS report pt. Was found in his car "unresponsive". They arrived to find pt. With a strong ETOH smell and was obtunded, having vomited a large amount. They also state they observed six pizza boxes in his car. A [employer] Officer arrives with them and they had recovered pt's. [place]D. [place] He has never been to our system before. He is combative and non-verbal upon arrival. Shortly after arrival, we gave 1mg  of Narcan; immediately after receiving the Narcan he became more alert, appeared frightened in demeanor, and remained non-verbal. Hard 4-point restraints were employed to maintain pt. Safety.

## 2017-11-03 NOTE — ED Notes (Signed)
Date and time results received: 11/03/17 2:42 PM  (use smartphrase ".now" to insert current time)  Test: Lactic acid  Critical Value: 2.4  Name of Provider Notified: Tim RN  Orders Received? Or Actions Taken?:

## 2017-11-03 NOTE — ED Notes (Signed)
We have exerted our efforts to complete CT/CXR/IV's and lab work. Pt. Has remains normotensive throughtout this visit thus far. He initially was in sinus tach., with a rate of ~ 140. He is now in nsr with heart rate in the 70's. A second dose of Narcan was just given in the presence of Dr. Estell HarpinZammit

## 2017-11-03 NOTE — ED Provider Notes (Signed)
[facility]-EMERGENCY DEPT Provider Note   CSN: [id number] Arrival date & time: 11/03/17  0907     History   Chief Complaint Chief Complaint  Patient presents with  . Altered Mental Status    HPI Alex Potter is a 22 y.o. male.  Patient was found unresponsive in a car.  Patient has history of substance abuse.  The history is provided by the EMS personnel. No language interpreter was used.  Altered Mental Status   This is a new problem. Episode onset: Unknown. The problem has been gradually improving. Associated symptoms include confusion. Risk factors include alcohol intake. His past medical history does not include seizures.    Past Medical History:  Diagnosis Date  . Drug abuse (HCC)   . Tobacco abuse     Patient Active Problem List   Diagnosis Date Noted  . Sepsis (HCC) 11/03/2017  . Choledocholithiasis   . Abnormal liver function 07/04/2017  . Depression 07/04/2017  . Acute cholecystitis 07/03/2017  . Gallstone 07/03/2017  . Right upper quadrant abdominal pain 07/03/2017  . Cholecystitis 07/03/2017  . Tobacco abuse   . Drug abuse [facility](HCC)     Past Surgical History:  Procedure Laterality Date  . CHOLECYSTECTOMY N/A 07/05/2017   Procedure: LAPAROSCOPIC CHOLECYSTECTOMY WITH INTRAOPERATIVE CHOLANGIOGRAM;  Surgeon: Jimmye NormanWyatt, Ramell, MD;  [facility];  Service: General;  Laterality: N/A;  . ERCP N/A 07/04/2017   Procedure: ENDOSCOPIC RETROGRADE CHOLANGIOPANCREATOGRAPHY (ERCP);  Surgeon: Sherrilyn Ristanis, Henry L III, MD;  [facility];  Service: Gastroenterology;  Laterality: N/A;  . INGUINAL HERNIA REPAIR Right         Home Medications    Prior to Admission medications   Medication Sig Start Date End Date Taking? Authorizing Provider  alum & mag hydroxide-simeth (MAALOX/MYLANTA) 200-200-20 MG/5ML suspension Take 15-30 mLs by mouth as needed for indigestion or heartburn.    [provider]  Benzocaine 15 MG LOZG Use as directed 1 each  in the mouth or throat every 4 (four) hours as needed (for sore throat).    [provider]  benzonatate (TESSALON) 100 MG capsule Take 200 mg by mouth 3 (three) times daily as needed for cough.    [provider]  cetirizine (ZYRTEC) 10 MG tablet Take 10 mg by mouth daily as needed for allergies.    [provider]  DULoxetine (CYMBALTA) 30 MG capsule Take 1 capsule (30 mg total) by mouth daily. 07/09/17   Narda BondsNettey, Ralph A, MD  Fructose-Dextrose-Phosphor Acd (NAUSEA CONTROL) 1.87-1.87-21.5 SOLN Take 5-10 mLs by mouth every 15 (fifteen) minutes as needed (for nausea).    [provider]  guaiFENesin (MUCINEX) 600 MG 12 hr tablet Take 600 mg by mouth 2 (two) times daily as needed for cough or to loosen phlegm.    [provider]  ibuprofen (ADVIL,MOTRIN) 600 MG tablet Take 600 mg by mouth every 6 (six) hours as needed for fever or mild pain.    [provider]  loperamide (IMODIUM A-D) 2 MG tablet Take 2 mg by mouth 4 (four) times daily as needed for diarrhea or loose stools.    [provider]  Melatonin 5 MG TABS Take 5-10 mg by mouth at bedtime as needed (for insomnia).    [provider]  methocarbamol (ROBAXIN) 500 MG tablet Take 1 tablet (500 mg total) by mouth every 8 (eight) hours as needed for muscle spasms. 07/09/17   Narda BondsNettey, Ralph A, MD  Multiple Vitamin (MULTIVITAMIN WITH MINERALS) TABS tablet Take  1 tablet by mouth daily.    [provider]  ondansetron (ZOFRAN) 8 MG tablet Take 8 mg by mouth 2 (two) times daily as needed for nausea or vomiting.    [provider]    Family History No family history on file.  Social History Social History   Tobacco Use  . Smoking status: Current Every Day Smoker  . Smokeless tobacco: Never Used  Substance Use Topics  . Alcohol use: Yes  . Drug use: Yes    Types: Marijuana, Methamphetamines     Allergies   Mushroom extract complex   Review of  Systems Review of Systems  Unable to perform ROS: Mental status change  Psychiatric/Behavioral: Positive for confusion.     Physical Exam Updated Vital Signs BP (!) 152/87   Pulse (!) 109   Temp 100 F (37.8 C)   Resp (!) 36   SpO2 91%   Physical Exam  Constitutional: He appears well-developed.  HENT:  Head: Normocephalic.  Pinpoint pupil  Eyes: Conjunctivae and EOM are normal. No scleral icterus.  Neck: Neck supple. No thyromegaly present.  Cardiovascular: Exam reveals no gallop and no friction rub.  No murmur heard. Tachycardia  Pulmonary/Chest: No stridor. He has wheezes. He has rales. He exhibits no tenderness.  Patient having significant upper airway noise and has some rales in his right upper lungs but much worse in the left upper lungs.  Abdominal: He exhibits no distension. There is no tenderness. There is no rebound.  Musculoskeletal: Normal range of motion. He exhibits no edema.  Lymphadenopathy:    He has no cervical adenopathy.  Neurological: He exhibits normal muscle tone. Coordination normal.  Patient very lethargic but did awake with Narcan.  He will look at you move all his extremities but he will not speak with you.  Skin: No rash noted. No erythema.     ED Treatments / Results  Labs (all labs ordered are listed, but only abnormal results are displayed) Labs Reviewed  CBC WITH DIFFERENTIAL/PLATELET - Abnormal; Notable for the following components:      Result Value   WBC 19.5 (*)    Neutro Abs 16.1 (*)    Monocytes Absolute 1.5 (*)    All other components within normal limits  COMPREHENSIVE METABOLIC PANEL - Abnormal; Notable for the following components:   Sodium 146 (*)    Glucose, Bld 125 (*)    AST 117 (*)    ALT 139 (*)    All other components within normal limits  I-STAT CHEM 8, ED - Abnormal; Notable for the following components:   Glucose, Bld 119 (*)    All other components within normal limits  I-STAT CG4 LACTIC ACID, ED - Abnormal;  Notable for the following components:   Lactic Acid, Venous 5.27 (*)    All other components within normal limits  CULTURE, BLOOD (ROUTINE X 2)  CULTURE, BLOOD (ROUTINE X 2)  ETHANOL  RAPID URINE DRUG SCREEN, HOSP PERFORMED  URINALYSIS, ROUTINE W REFLEX MICROSCOPIC  I-STAT CG4 LACTIC ACID, ED    EKG EKG Interpretation  Date/Time:  Sunday November 03 2017 09:22:05 EDT Ventricular Rate:  109 PR Interval:    QRS Duration: 101 QT Interval:  303 QTC Calculation: 408 R Axis:   80 Text Interpretation:  Sinus tachycardia Baseline wander in lead(s) III V2 V3 V4 Confirmed by Bethann Berkshire [phone]) on 11/03/2017 10:54:18 AM   Radiology Dg Chest Port 1 View  Result Date: 11/03/2017 CLINICAL DATA:  Patient found  unresponsive. EXAM: PORTABLE CHEST 1 VIEW COMPARISON:  None. FINDINGS: Lungs are adequately inflated and otherwise clear. Cardiomediastinal silhouette, bones and soft tissues are normal. IMPRESSION: No active disease. Electronically Signed   By: Elberta Fortis M.D.   On: 11/03/2017 10:42    Procedures Procedures (including critical care time)  Medications Ordered in ED Medications  sodium chloride 0.9 % bolus 1,000 mL (0 mLs Intravenous Stopped 11/03/17 1101)  naloxone (NARCAN) injection 1 mg (1 mg Intravenous Given 11/03/17 0929)  sodium chloride 0.9 % bolus 1,000 mL (1,000 mLs Intravenous New Bag/Given 11/03/17 1000)  vancomycin (VANCOCIN) IVPB 1000 mg/200 mL premix (0 mg Intravenous Stopped 11/03/17 1119)  piperacillin-tazobactam (ZOSYN) IVPB 3.375 g (0 g Intravenous Stopped 11/03/17 1119)  naloxone (NARCAN) injection 1 mg (1 mg Intravenous Given 11/03/17 1102)     Initial Impression / Assessment and Plan / ED Course  I have reviewed the triage vital signs and the nursing notes.  Pertinent labs & imaging results that were available during my care of the patient were reviewed by me and considered in my medical decision making (see chart for details).     CRITICAL CARE Performed  by: Bethann Berkshire Total critical care time:45 minutes Critical care time was exclusive of separately billable procedures and treating other patients. Critical care was necessary to treat or prevent imminent or life-threatening deterioration. Critical care was time spent personally by me on the following activities: development of treatment plan with patient and/or surrogate as well as nursing, discussions with consultants, evaluation of patient's response to treatment, examination of patient, obtaining history from patient or surrogate, ordering and performing treatments and interventions, ordering and review of laboratory studies, ordering and review of radiographic studies, pulse oximetry and re-evaluation of patient's condition. Patient with altered mental status.  Suspect patient has developed a aspiration pneumonia.  Possible substance abuse.  Patient has been treated with sepsis protocol.  He will be admitted to medicine to stepdown  Final Clinical Impressions(s) / ED Diagnoses   Final diagnoses:  Acute sepsis Pratt)    ED Discharge Orders    None       Bethann Berkshire, MD 11/03/17 1142

## 2017-11-03 NOTE — Progress Notes (Signed)
CRITICAL VALUE ALERT  Critical Value: Lactic Acid 7.0  Date & Time Notied:  11/03/2017 1743  Provider Notified: Burman Fosterhoi, J. MD  Orders Received/Actions taken: Awaiting orders

## 2017-11-04 ENCOUNTER — Inpatient Hospital Stay (HOSPITAL_COMMUNITY): Payer: BLUE CROSS/BLUE SHIELD

## 2017-11-04 DIAGNOSIS — G9341 Metabolic encephalopathy: Secondary | ICD-10-CM

## 2017-11-04 DIAGNOSIS — T17908A Unspecified foreign body in respiratory tract, part unspecified causing other injury, initial encounter: Secondary | ICD-10-CM

## 2017-11-04 DIAGNOSIS — G9340 Encephalopathy, unspecified: Secondary | ICD-10-CM

## 2017-11-04 DIAGNOSIS — J9601 Acute respiratory failure with hypoxia: Secondary | ICD-10-CM

## 2017-11-04 DIAGNOSIS — E872 Acidosis: Secondary | ICD-10-CM

## 2017-11-04 DIAGNOSIS — F1910 Other psychoactive substance abuse, uncomplicated: Secondary | ICD-10-CM

## 2017-11-04 LAB — GLUCOSE, CAPILLARY: Glucose-Capillary: 113 mg/dL — ABNORMAL HIGH (ref 65–99)

## 2017-11-04 LAB — CBC
HCT: 36.3 % — ABNORMAL LOW (ref 39.0–52.0)
HEMOGLOBIN: 11.4 g/dL — AB (ref 13.0–17.0)
MCH: 25.3 pg — AB (ref 26.0–34.0)
MCHC: 31.4 g/dL (ref 30.0–36.0)
MCV: 80.5 fL (ref 78.0–100.0)
Platelets: 197 10*3/uL (ref 150–400)
RBC: 4.51 MIL/uL (ref 4.22–5.81)
RDW: 13.8 % (ref 11.5–15.5)
WBC: 14.4 10*3/uL — ABNORMAL HIGH (ref 4.0–10.5)

## 2017-11-04 LAB — BASIC METABOLIC PANEL
Anion gap: 6 (ref 5–15)
BUN: 9 mg/dL (ref 6–20)
CHLORIDE: 109 mmol/L (ref 101–111)
CO2: 26 mmol/L (ref 22–32)
Calcium: 8.4 mg/dL — ABNORMAL LOW (ref 8.9–10.3)
Creatinine, Ser: 0.86 mg/dL (ref 0.61–1.24)
GFR calc Af Amer: >60 mL/min (ref >60)
GFR calc non Af Amer: >60 mL/min (ref >60)
GLUCOSE: 97 mg/dL (ref 65–99)
Potassium: 3.6 mmol/L (ref 3.5–5.1)
Sodium: 141 mmol/L (ref 135–145)

## 2017-11-04 LAB — RESPIRATORY PANEL BY PCR
Adenovirus: NOT DETECTED
Bordetella pertussis: NOT DETECTED
CORONAVIRUS OC43-RVPPCR: NOT DETECTED
Chlamydophila pneumoniae: NOT DETECTED
Coronavirus 229E: NOT DETECTED
Coronavirus HKU1: NOT DETECTED
Coronavirus NL63: NOT DETECTED
INFLUENZA A-RVPPCR: NOT DETECTED
Influenza B: NOT DETECTED
METAPNEUMOVIRUS-RVPPCR: NOT DETECTED
Mycoplasma pneumoniae: NOT DETECTED
PARAINFLUENZA VIRUS 1-RVPPCR: NOT DETECTED
PARAINFLUENZA VIRUS 4-RVPPCR: NOT DETECTED
Parainfluenza Virus 2: NOT DETECTED
Parainfluenza Virus 3: NOT DETECTED
RESPIRATORY SYNCYTIAL VIRUS-RVPPCR: NOT DETECTED
Rhinovirus / Enterovirus: NOT DETECTED

## 2017-11-04 LAB — MAGNESIUM: Magnesium: 1.8 mg/dL (ref 1.7–2.4)

## 2017-11-04 LAB — CSF CELL COUNT WITH DIFFERENTIAL
RBC COUNT CSF: 3 /mm3 — AB
WBC, CSF: 2 /mm3 (ref 0–5)

## 2017-11-04 LAB — MRSA PCR SCREENING: MRSA by PCR: POSITIVE — AB

## 2017-11-04 LAB — PROTEIN AND GLUCOSE, CSF
GLUCOSE CSF: 57 mg/dL (ref 40–70)
TOTAL PROTEIN, CSF: 22 mg/dL (ref 15–45)

## 2017-11-04 LAB — LACTIC ACID, PLASMA
LACTIC ACID, VENOUS: 2.5 mmol/L — AB (ref 0.5–1.9)
Lactic Acid, Venous: 1.9 mmol/L (ref 0.5–1.9)

## 2017-11-04 LAB — HIV ANTIBODY (ROUTINE TESTING W REFLEX): HIV Screen 4th Generation wRfx: NONREACTIVE

## 2017-11-04 LAB — PHOSPHORUS: Phosphorus: 2.6 mg/dL (ref 2.5–4.6)

## 2017-11-04 LAB — TRIGLYCERIDES: Triglycerides: 139 mg/dL (ref <150)

## 2017-11-04 MED ORDER — ORAL CARE MOUTH RINSE
15.0000 mL | OROMUCOSAL | Status: DC
  Administered 2017-11-04 (×8): 15 mL via OROMUCOSAL

## 2017-11-04 MED ORDER — SODIUM CHLORIDE 0.9 % IV SOLN
3.0000 g | Freq: Four times a day (QID) | INTRAVENOUS | Status: DC
  Administered 2017-11-04 – 2017-11-05 (×5): 3 g via INTRAVENOUS
  Filled 2017-11-04 (×6): qty 3

## 2017-11-04 MED ORDER — CHLORHEXIDINE GLUCONATE 0.12% ORAL RINSE (MEDLINE KIT)
15.0000 mL | Freq: Two times a day (BID) | OROMUCOSAL | Status: DC
  Administered 2017-11-04 (×2): 15 mL via OROMUCOSAL

## 2017-11-04 MED ORDER — MAGNESIUM SULFATE 2 GM/50ML IV SOLN
2.0000 g | Freq: Once | INTRAVENOUS | Status: AC
  Administered 2017-11-04: 2 g via INTRAVENOUS
  Filled 2017-11-04: qty 50

## 2017-11-04 MED ORDER — DEXMEDETOMIDINE HCL IN NACL 400 MCG/100ML IV SOLN
0.2000 ug/kg/h | INTRAVENOUS | Status: DC
  Administered 2017-11-04: 0.6 ug/kg/h via INTRAVENOUS
  Filled 2017-11-04: qty 100

## 2017-11-04 MED ORDER — FOLIC ACID 5 MG/ML IJ SOLN
1.0000 mg | Freq: Every day | INTRAMUSCULAR | Status: DC
Start: 2017-11-04 — End: 2017-11-05
  Administered 2017-11-04: 1 mg via INTRAVENOUS
  Filled 2017-11-04 (×2): qty 0.2

## 2017-11-04 MED ORDER — THIAMINE HCL 100 MG/ML IJ SOLN
100.0000 mg | Freq: Every day | INTRAMUSCULAR | Status: DC
  Administered 2017-11-04: 100 mg via INTRAVENOUS
  Filled 2017-11-04: qty 2

## 2017-11-04 MED ORDER — ORAL CARE MOUTH RINSE: 15.0000 mL | Freq: Two times a day (BID) | OROMUCOSAL | Status: DC

## 2017-11-04 NOTE — Progress Notes (Signed)
Pt ttransported from MRI to 4N 27 without event.  RT will monitor.

## 2017-11-04 NOTE — Procedures (Signed)
Indication: CNS infection  Risks of the procedure were dicussed with the patient including post-LP headache, bleeding, infection, weakness/numbness of legs(radiculopathy), death.  The patient agreed and written consent was obtained.   The patient was prepped and draped, and using sterile technique a 20 gauge quinke spinal needle was inserted in the L3/L4 space. The opening pressure was 28 cc H2o. Approximately 14 cc of CSF were obtained and sent for analysis. CSF obtained on second attempt. No complications.   Felicie Mornavid Bonne Whack PA-C [facility] [phone]  M-F  (9:00 am- 5:00 PM)  11/04/2017, 1:02 PM

## 2017-11-04 NOTE — Progress Notes (Signed)
MRI completed. Images personally reviewed. No venous sinus thrombosis. The subtle FLAIR signal in some sulci is an equivocal finding, but if present, could be secondary to FiO2 from with artificial ventilation. The MRI is essentially normal.   Electronically signed: Dr. Caryl PinaEric Eh Sesay

## 2017-11-04 NOTE — Progress Notes (Addendum)
Subjective: Continues to improve. Denies headache. Admits to using synthetic smoking blend.   Exam: Vitals:   11/04/17 0630 11/04/17 0700  BP: 123/74 125/76  Pulse: (!) 54 (!) 58  Resp: 17 16  Temp: 99.1 F (37.3 C) 99.5 F (37.5 C)  SpO2: 98% 98%   Gen: In bed, intubated Resp: ventilated Abd: soft, nt  Neuro: MS: Awake, alert, follows commands briskly CN: PERRL, tracks across midline in both directions,  Motor: follows commands with good strength x 4.  Sensory: intact to LT DTR: 2+ and symmetric  Pertinent Labs: BMP - unremarkable other than borderline calcium.   Impression: 22 yo M presenting with opsoclonus, hyperreflexia, fever, agitation, borderline fever, lactic acidosis who is rapidly improving. With improvement, lack of headache and admission of synthetic cannabinoid use, I think that serotonin syndrome due to this is much more likely than infection.   Recommendations: 1) EEG  2) Once extubated and cognitive status can be better assessed, would stop antibiotics.  3) will continue to follow.   Ritta SlotMcNeill Lyanne Kates, MD [facility] [phone]  If 7pm- 7am, please page neurology on call as listed in AMION.

## 2017-11-04 NOTE — Progress Notes (Signed)
EEG completed; results pending.    

## 2017-11-04 NOTE — Procedures (Signed)
HPI:  22 y/o with MS change  TECHNICAL SUMMARY:  A multichannel referential and bipolar montage EEG using the standard international 10-20 system was performed on the patient.  The dominant background activity consists of 10-11 hertz activity seen most prominantly over the posterior head region.  The backgound activity is reactive to eye opening and closing procedures.  Low voltage fast (beta) activity is distributed symmetrically and maximally over the anterior head regions.  ACTIVATION:  Stepwise photic stimulation and hyperventilation were not performed.  EPILEPTIFORM ACTIVITY:  There were no spikes, sharp waves or paroxysmal activity.  SLEEP: Physiologic drowsiness is noted, but no stage II sleep is noted.  CARDIAC:  The EKG lead revealed a regular sinus rhythm.  IMPRESSION:  This is a normal EEG for the patients stated age.  There were no focal, hemispheric or lateralizing features.  No epileptiform activity was recorded.  A normal EEG does not exclude the diagnosis of a seizure disorder and if seizure remains high on the list of differential diagnosis, an ambulatory EEG may be of value.  Clinical correlation is required.

## 2017-11-04 NOTE — Procedures (Signed)
Extubation Procedure Note  Patient Details:   Name: Alex Potter DOB: 1996/02/23 MRN: [id number]   Airway Documentation:  Airway 7.5 mm (Active)  Secured at (cm) 24 cm 11/04/2017  8:04 AM  Measured From Lips 11/04/2017  8:04 AM  Secured Location Center 11/04/2017  8:04 AM  Secured By [employer] Tube Holder 11/04/2017  8:04 AM  Tube Holder Repositioned Yes 11/04/2017  8:04 AM  Cuff Pressure (cm H2O) 25 cm H2O 11/03/2017 10:00 PM  Site Condition Dry 11/04/2017  8:04 AM   Vent end date: (not recorded) Vent end time: (not recorded)   Evaluation  O2 sats: stable throughout Complications: No apparent complications Patient did tolerate procedure well. Bilateral Breath Sounds: Rhonchi   Yes   Patient extubated to 4lnc. Vital signs stable at this time. No complications. RN at bedside. RT will continue to monitor.  Ave Filterdkins, Alex Potter 11/04/2017, 9:27 AM

## 2017-11-04 NOTE — Progress Notes (Signed)
.. ..  Name: Alex LiasJames Savary MRN: [id number] DOB: 1996/03/22    ADMISSION DATE:  11/03/2017 CONSULTATION DATE:  11/03/17  REFERRING MD :  NEUROLOGYLaurence Slate- AROOR MD  CHIEF COMPLAINT:  Altered mental status  BRIEF PATIENT DESCRIPTION: 22 yr old male with PMHx sig polysubstance abuse [facility] for AMS workup found on CTH to have diffuse hypoxic-ischemic injury/cerebral edema. Neuro recommended MRI/MRV with EEG to follow. Pt agitated and requiring intubation prior to scan. PCCM consulted.  SIGNIFICANT EVENTS  Awake, on precedex.  Follows commands.  PS wean 5/5.   STUDIES:  CTH:  Complete effacement of the cerebral sulci may be related to the patient's young age and robust baseline brain volume as gray-white differentiation appears preserved throughout and there is no frank herniation to suggest the presence of diffuse cerebral edema. There is no evidence of acute infarct, intracranial hemorrhage, mass, or extra-axial fluid collection. Vascular: No hyperdense vessel. Skull: No fracture or focal osseous lesion. Sinuses/Orbits: Visualized paranasal sinuses and mastoid air cells are clear. Orbits are unremarkable. MRI brain 6/24>> 1. Subtly increased FLAIR signal seen within multiple cortical sulci of the bilateral parieto-occipital regions, nonspecific, but favored to be related to FiO2 administration in this intubated patient. If there is clinical concern for possible acute meningitis, then correlation with LP and CSF analysis is recommended. 2. Otherwise normal brain MRI. No evidence for cerebral anoxia or other abnormality.  MICRO:  BC x 2 6/23>>>  RVP 6/23>>>  Sputum  6/23>>>    ABX:  vanc 6/23>>> Zosyn 6/23>>> Flagyl 6/23>>> unasyn 6/24>>>  SUBJECTIVE:    VITAL SIGNS: Temp:  [97.9 F (36.6 C)-101 F (38.3 C)] 99.5 F (37.5 C) (06/24 0700) Pulse Rate:  [54-169] 82 (06/24 0804) Resp:  [16-36] 16 (06/24 0700) BP: (108-160)/(66-96) 127/80  (06/24 0804) SpO2:  [84 %-100 %] 98 % (06/24 0700) FiO2 (%):  [40 %-100 %] 40 % (06/24 0804) Weight:  [73.9 kg (163 lb)-84.7 kg (186 lb 11.7 oz)] 84.7 kg (186 lb 11.7 oz) (06/24 0500)  PHYSICAL EXAMINATION: General:  Young male, NAD on PS wean  Neuro:  Sedated on precedex, wakes easily, follows commands, a bit drowsy still, MAE, nods appropriately  HEENT:  ETT< mm moist  Cardiovascular:  s1s2 rrr Lungs: resps even non labored on PS 5/5 with Vt 600, few scattered rhonchi R>L  Abdomen:  Soft, +bs  Musculoskeletal:  Warm and dry, no edema    Recent Labs  Lab 11/03/17 0925 11/03/17 0930 11/03/17 2042 11/04/17 0058  NA 146* 145 144 141  K 4.0 4.1 4.0 3.6  CL 108 106 112* 109  CO2 24  --  24 26  BUN 15 15 11 9   CREATININE 1.10 1.00 0.91 0.86  GLUCOSE 125* 119* 92 97   Recent Labs  Lab 11/03/17 0925 11/03/17 0930 11/03/17 2042 11/04/17 0058  HGB 14.5 15.6 12.5* 11.4*  HCT 45.3 46.0 38.4* 36.3*  WBC 19.5*  --  19.0* 14.4*  PLT 308  --  218 197   Ct Head Wo Contrast  Result Date: 11/03/2017 CLINICAL DATA:  Found in car unresponsive. EXAM: CT HEAD WITHOUT CONTRAST TECHNIQUE: Contiguous axial images were obtained from the base of the skull through the vertex without intravenous contrast. COMPARISON:  None. FINDINGS: Brain: Complete effacement of the cerebral sulci may be related to the patient's young age and robust baseline brain volume as gray-white differentiation appears preserved throughout and there is no frank herniation to suggest the presence of  diffuse cerebral edema. There is no evidence of acute infarct, intracranial hemorrhage, mass, or extra-axial fluid collection. Vascular: No hyperdense vessel. Skull: No fracture or focal osseous lesion. Sinuses/Orbits: Visualized paranasal sinuses and mastoid air cells are clear. Orbits are unremarkable. Other: None. IMPRESSION: 1. No definite acute intracranial abnormality identified. 2. Paucity of CSF without additional findings of  diffuse hypoxic-ischemic injury/cerebral edema. These results were called by telephone at the time of interpretation on 11/03/2017 at 11:15 am to Dr. Bethann Berkshire , who verbally acknowledged these results. Electronically Signed   By: Sebastian Ache M.D.   On: 11/03/2017 11:39   Mr Brain Wo Contrast  Result Date: 11/04/2017 CLINICAL DATA:  Initial evaluation for acute altered mental status, concern for possible cerebral anoxia. EXAM: MRI HEAD WITHOUT CONTRAST MRV HEAD WITHOUT CONTRAST TECHNIQUE: Multiplanar, multiecho pulse sequences of the brain and surrounding structures were obtained without intravenous contrast. Angiographic images of the intracranial venous structures were obtained using MRV technique without intravenous contrast. COMPARISON:  Prior CT from earlier the same day. FINDINGS: MRI HEAD FINDINGS Cerebral volume within normal limits for age. No focal parenchymal signal abnormality identified. Gray-white matter differentiation maintained. No significant gyral swelling, edema, or diffusion abnormality to suggest global cerebral anoxia. No evidence for acute or subacute infarct. No foci of susceptibility artifact to suggest acute or chronic intracranial hemorrhage. Subtly increased FLAIR signal seen within the cortical sulci of the bilateral parieto-occipital regions, suspected to be most likely related to FIO2 administration. No mass lesion, midline shift or mass effect. No hydrocephalus. No extra-axial fluid collection. Pituitary gland within normal limits. Suprasellar region normal. Midline structures intact and normal. Major intracranial vascular flow voids are maintained. Craniocervical junction within normal limits. Upper cervical spine normal. Bone marrow signal intensity normal. No scalp soft tissue abnormality. Globes and orbital soft tissues within normal limits. Mild scattered mucosal thickening throughout the paranasal sinuses. No significant mastoid effusion. Inner ear structures grossly  normal. MRV FINDINGS Normal flow related signal seen within the superior sagittal sinus to the level of the torcula. Transverse and sigmoid sinuses are patent as are the proximal internal jugular veins. Right transverse sinus dominant. Straight sinus, vein of Galen, and internal cerebral veins are patent as are the basal veins of Rosenthal. No appreciable dural sinus stenosis. IMPRESSION: MRI HEAD IMPRESSION 1. Subtly increased FLAIR signal seen within multiple cortical sulci of the bilateral parieto-occipital regions, nonspecific, but favored to be related to FiO2 administration in this intubated patient. If there is clinical concern for possible acute meningitis, then correlation with LP and CSF analysis is recommended. 2. Otherwise normal brain MRI. No evidence for cerebral anoxia or other abnormality. MRV HEAD IMPRESSION Normal intracranial MRV.  No evidence for dural sinus thrombosis. Electronically Signed   By: Rise Mu M.D.   On: 11/04/2017 01:10   Dg Chest Port 1 View  Result Date: 11/04/2017 CLINICAL DATA:  Patient on ventilator.  Evaluate ETT. EXAM: PORTABLE CHEST 1 VIEW COMPARISON:  November 03, 2017 FINDINGS: The ETT terminates 2.7 cm above the carina. The OG tube terminates below today's film. No pneumothorax. No nodules or masses. Mild opacity in the right base is stable. No other interval changes. IMPRESSION: 1. Support apparatus as above. 2. Mild opacity in the right base. Recommend attention on follow-up. Electronically Signed   By: Gerome Sam III M.D   On: 11/04/2017 07:59   Dg Chest Port 1 View  Result Date: 11/03/2017 CLINICAL DATA:  22 year old male status post intubation. EXAM: PORTABLE CHEST 1  VIEW COMPARISON:  Chest radiograph dated 11/03/2017 FINDINGS: There has been interval placement of an endotracheal tube with tip approximately 3 cm above the carina. An enteric tube extends below the diaphragm to the right side of the spine with tip beyond the inferior margin of the  image. There are bilateral interstitial streaky densities. Bibasilar densities, right greater than left may represent atelectatic changes or infiltrate. Trace right pleural effusion may be present. There is no pneumothorax. Stable cardiac silhouette. No acute osseous pathology. IMPRESSION: 1. Interval placement of endotracheal and enteric tubes. 2. Mild interstitial and bibasilar streaky densities, possibly atelectatic changes. Clinical correlation is recommended. Electronically Signed   By: Elgie Collard M.D.   On: 11/03/2017 21:26   Dg Chest Port 1 View  Result Date: 11/03/2017 CLINICAL DATA:  Patient found unresponsive. EXAM: PORTABLE CHEST 1 VIEW COMPARISON:  None. FINDINGS: Lungs are adequately inflated and otherwise clear. Cardiomediastinal silhouette, bones and soft tissues are normal. IMPRESSION: No active disease. Electronically Signed   By: Elberta Fortis M.D.   On: 11/03/2017 10:42   Mr Mrv Head Wo Cm  Result Date: 11/04/2017 CLINICAL DATA:  Initial evaluation for acute altered mental status, concern for possible cerebral anoxia. EXAM: MRI HEAD WITHOUT CONTRAST MRV HEAD WITHOUT CONTRAST TECHNIQUE: Multiplanar, multiecho pulse sequences of the brain and surrounding structures were obtained without intravenous contrast. Angiographic images of the intracranial venous structures were obtained using MRV technique without intravenous contrast. COMPARISON:  Prior CT from earlier the same day. FINDINGS: MRI HEAD FINDINGS Cerebral volume within normal limits for age. No focal parenchymal signal abnormality identified. Gray-white matter differentiation maintained. No significant gyral swelling, edema, or diffusion abnormality to suggest global cerebral anoxia. No evidence for acute or subacute infarct. No foci of susceptibility artifact to suggest acute or chronic intracranial hemorrhage. Subtly increased FLAIR signal seen within the cortical sulci of the bilateral parieto-occipital regions, suspected to  be most likely related to FIO2 administration. No mass lesion, midline shift or mass effect. No hydrocephalus. No extra-axial fluid collection. Pituitary gland within normal limits. Suprasellar region normal. Midline structures intact and normal. Major intracranial vascular flow voids are maintained. Craniocervical junction within normal limits. Upper cervical spine normal. Bone marrow signal intensity normal. No scalp soft tissue abnormality. Globes and orbital soft tissues within normal limits. Mild scattered mucosal thickening throughout the paranasal sinuses. No significant mastoid effusion. Inner ear structures grossly normal. MRV FINDINGS Normal flow related signal seen within the superior sagittal sinus to the level of the torcula. Transverse and sigmoid sinuses are patent as are the proximal internal jugular veins. Right transverse sinus dominant. Straight sinus, vein of Galen, and internal cerebral veins are patent as are the basal veins of Rosenthal. No appreciable dural sinus stenosis. IMPRESSION: MRI HEAD IMPRESSION 1. Subtly increased FLAIR signal seen within multiple cortical sulci of the bilateral parieto-occipital regions, nonspecific, but favored to be related to FiO2 administration in this intubated patient. If there is clinical concern for possible acute meningitis, then correlation with LP and CSF analysis is recommended. 2. Otherwise normal brain MRI. No evidence for cerebral anoxia or other abnormality. MRV HEAD IMPRESSION Normal intracranial MRV.  No evidence for dural sinus thrombosis. Electronically Signed   By: Rise Mu M.D.   On: 11/04/2017 01:10    ASSESSMENT / PLAN:  Acute metabolic encephalopathy - CT, MRI neg acute  ??serotonin syndrome r/t synthetic THC - presented with hyperreflexia, borderline fever, agitation, lactic acidosis  H/o polysubstance abuse - UDS positive for THC (synthetic) ETOH  withdrawal? PLAN -  Wean precedex gtt  Neuro following  EEG pending    Substance abuse counseling  Watch for ETOH withdrawal    HTN - improved  PLAN -  Trend lactate  Echo pending   Acute respiratory failure with hypoxia ?aspiration PNA  PLAN -  Vent support - 8cc/kg  F/u CXR  F/u ABG Tolerating SBT - can likely extubate once a bit more awake - turning precedex off  Continue abx as above for ?aspiration  RVP pending  Sputum culture pending  Will narrow abx to unasyn    Elevated LFT's - mild  Hx ETOH  PLAN -  NPO for now  [facility] for clear liquids post extubation - advance as tol  pepcid  F/u LFT's  Thiamine, folate    Heme- no active issue  PLAN -  lovenox  F/u CBC    Anion Gap metabolic acidosis secondary to Lactic acidosis - improved  PLAN -  F/u chem  Trend lactate     DISPOSITION: Neuro ICU CC TIME: 74 mins PROGNOSIS: Guarded FAMILY: no family at bedside 6/24 CODE STATUS: Full   Dirk Dress, NP 11/04/2017  9:15 AM Pager: [phone] or [phone]

## 2017-11-04 NOTE — Progress Notes (Signed)
Still febrile, mental status has mostly cleared, but he does endorse some headache, and now that he is more cognizant, states that he does not remember smoking anything in particular.   I do think that LP would be prudent at this point given MRI/clinical picture prior to stopping abx.   Ritta SlotMcNeill Kirkpatrick, MD [facility] [phone]  If 7pm- 7am, please page neurology on call as listed in AMION.

## 2017-11-05 LAB — CBC
HCT: 35.7 % — ABNORMAL LOW (ref 39.0–52.0)
Hemoglobin: 11.5 g/dL — ABNORMAL LOW (ref 13.0–17.0)
MCH: 25.3 pg — AB (ref 26.0–34.0)
MCHC: 32.2 g/dL (ref 30.0–36.0)
MCV: 78.6 fL (ref 78.0–100.0)
PLATELETS: 199 10*3/uL (ref 150–400)
RBC: 4.54 MIL/uL (ref 4.22–5.81)
RDW: 13.2 % (ref 11.5–15.5)
WBC: 14.4 10*3/uL — ABNORMAL HIGH (ref 4.0–10.5)

## 2017-11-05 LAB — BASIC METABOLIC PANEL
Anion gap: 13 (ref 5–15)
BUN: 6 mg/dL (ref 6–20)
CALCIUM: 8.5 mg/dL — AB (ref 8.9–10.3)
CHLORIDE: 103 mmol/L (ref 98–111)
CO2: 24 mmol/L (ref 22–32)
CREATININE: 0.94 mg/dL (ref 0.61–1.24)
GFR calc Af Amer: >60 mL/min (ref >60)
Glucose, Bld: 83 mg/dL (ref 70–99)
Potassium: 3.2 mmol/L — ABNORMAL LOW (ref 3.5–5.1)
Sodium: 140 mmol/L (ref 135–145)

## 2017-11-05 MED ORDER — MELATONIN 3 MG PO TABS
3.0000 mg | ORAL_TABLET | Freq: Once | ORAL | Status: AC
  Administered 2017-11-05: 3 mg via ORAL
  Filled 2017-11-05: qty 1

## 2017-11-05 MED ORDER — MUPIROCIN 2 % EX OINT
1.0000 "application " | TOPICAL_OINTMENT | Freq: Two times a day (BID) | CUTANEOUS | Status: DC
  Administered 2017-11-05 – 2017-11-07 (×5): 1 via NASAL
  Filled 2017-11-05 (×4): qty 22

## 2017-11-05 MED ORDER — CHLORHEXIDINE GLUCONATE CLOTH 2 % EX PADS
6.0000 | MEDICATED_PAD | Freq: Every day | CUTANEOUS | Status: DC
  Administered 2017-11-05 – 2017-11-06 (×2): 6 via TOPICAL

## 2017-11-05 MED ORDER — VITAMIN B-1 100 MG PO TABS
100.0000 mg | ORAL_TABLET | Freq: Every day | ORAL | Status: DC
  Administered 2017-11-05 – 2017-11-07 (×3): 100 mg via ORAL
  Filled 2017-11-05 (×3): qty 1

## 2017-11-05 MED ORDER — FOLIC ACID 1 MG PO TABS
1.0000 mg | ORAL_TABLET | Freq: Every day | ORAL | Status: DC
  Administered 2017-11-05 – 2017-11-07 (×3): 1 mg via ORAL
  Filled 2017-11-05 (×3): qty 1

## 2017-11-05 MED ORDER — POTASSIUM CHLORIDE CRYS ER 20 MEQ PO TBCR
30.0000 meq | EXTENDED_RELEASE_TABLET | ORAL | Status: AC
  Administered 2017-11-05 (×2): 30 meq via ORAL
  Filled 2017-11-05 (×2): qty 1

## 2017-11-05 NOTE — Progress Notes (Signed)
E-Link MD paged second time to ask about melatonin or another sleep aid for patient. Was told I need to contact pharmacy for melatonin. No new orders at this moment. Will continue to monitor

## 2017-11-05 NOTE — Progress Notes (Signed)
Subjective: Continues to improve. LP yesterday with no signs of infection.   He does not remember the LP yesterday.   Exam: Vitals:   11/05/17 0700 11/05/17 0757  BP: 121/73   Pulse: (!) 58 64  Resp: (!) 26 (!) 24  Temp: 99.9 F (37.7 C) 99.5 F (37.5 C)  SpO2: 93% 94%   Gen: In bed, intubated Resp: ventilated Abd: soft, nt  Neuro: MS: Awake, alert, follows commands briskly. He repeats the phrase "The orchestra played and the audience appluaded" at 5 minutes as "The orchestra played and the crowd cheered" CN: PERRL, tracks across midline in both directions,  Motor: follows commands with good strength x 4.  Sensory: intact to LT DTR: 2+ and symmetric  Pertinent Labs: BMP - unremarkable other than borderline calcium.   Impression: 22 yo M presenting with opsoclonus, hyperreflexia, fever, agitation, borderline fever, lactic acidosis who is rapidly improving. I think that possibilities include serotonin syndrome, particularly if he used synthetic cannibinoids, or could be direct effect of overdose.  This type of anterograde amnesia can be seen with fentanyl overdose but typically there are MRI findings as well.   Recommendations: 1) Can d/c antibiotics from neuro standpoint.  2) will continue to follow.   Ritta SlotMcNeill Clovis Warwick, MD [facility] [phone]  If 7pm- 7am, please page neurology on call as listed in AMION.

## 2017-11-05 NOTE — Progress Notes (Signed)
Met patient and sitter.  He was thankful to meet however did not express any spiritual needs. I shared chaplains are available if he should want to have someone to talk with or prayer or other, just request chaplain to be called. Conard Novak, Chaplain   11/05/17 1300  Clinical Encounter Type  Visited With Patient  Visit Type Initial  Referral From Nurse  Consult/Referral To Chaplain  Spiritual Encounters  Spiritual Needs Other (Comment) (offered chaplain support.)  Stress Factors  Patient Stress Factors Not reviewed  Family Stress Factors Not reviewed

## 2017-11-05 NOTE — Progress Notes (Signed)
.. ..  Name: Alex Potter MRN: [id number] DOB: 09-Aug-1995    ADMISSION DATE:  11/03/2017 CONSULTATION DATE:  11/03/17  REFERRING MD :  NEUROLOGYLaurence Slate MD  CHIEF COMPLAINT:  Altered mental status  BRIEF PATIENT DESCRIPTION: 22 yr old male with PMHx sig polysubstance abuse presented on 11/03/17 to [facility] admitted by [facility] for AMS workup found on CTH to have diffuse hypoxic-ischemic injury/cerebral edema. Neuro recommended MRI/MRV with EEG to follow. Pt agitated and requiring intubation prior to scan. PCCM consulted.  SIGNIFICANT EVENTS  Extubated 11/04/2017.  625 awake alert no acute distress transferred to the floor.  STUDIES:  CTH:  Complete effacement of the cerebral sulci may be related to the patient's young age and robust baseline brain volume as gray-white differentiation appears preserved throughout and there is no frank herniation to suggest the presence of diffuse cerebral edema. There is no evidence of acute infarct, intracranial hemorrhage, mass, or extra-axial fluid collection. Vascular: No hyperdense vessel. Skull: No fracture or focal osseous lesion. Sinuses/Orbits: Visualized paranasal sinuses and mastoid air cells are clear. Orbits are unremarkable. MRI brain 6/24>> 1. Subtly increased FLAIR signal seen within multiple cortical sulci of the bilateral parieto-occipital regions, nonspecific, but favored to be related to FiO2 administration in this intubated patient. If there is clinical concern for possible acute meningitis, then correlation with LP and CSF analysis is recommended. 2. Otherwise normal brain MRI. No evidence for cerebral anoxia or other abnormality.  MICRO:  BC x 2 6/23>>>  RVP 6/23>>>  Sputum  6/23>>>    ABX:  vanc 6/23>>> 625 Zosyn 6/23>>> 625 Flagyl 6/23>>> 625 unasyn 6/24>>> 6 25 SUBJECTIVE:    VITAL SIGNS: Temp:  [99.1 F (37.3 C)-101.5 F (38.6 C)] 99.5 F (37.5 C) (06/25 0800) Pulse Rate:  [50-105] 77 (06/25 0800) Resp:  [16-32] 27  (06/25 0800) BP: (110-143)/(67-102) 129/78 (06/25 0800) SpO2:  [88 %-100 %] 94 % (06/25 0800) Weight:  [82.4 kg (181 lb 10.5 oz)] 82.4 kg (181 lb 10.5 oz) (06/25 0500)  PHYSICAL EXAMINATION: General: Awake alert somewhat dull effect but intact HEENT: No JVD lymphadenopathy is appreciated Neuro:  CV: s1s2 rrr, no m/r/g PULM: even/non-labored, lungs bilaterally diminished in bases GM:WNUU, non-tender, bsx4 active  Extremities: warm/dry, negative edema  Skin: no rashes or lesions, multiple tattoos are noted    Recent Labs  Lab 11/03/17 2042 11/04/17 0058 11/05/17 0421  NA 144 141 140  K 4.0 3.6 3.2*  CL 112* 109 103  CO2 24 26 24   BUN 11 9 6   CREATININE 0.91 0.86 0.94  GLUCOSE 92 97 83   Recent Labs  Lab 11/03/17 2042 11/04/17 0058 11/05/17 0421  HGB 12.5* 11.4* 11.5*  HCT 38.4* 36.3* 35.7*  WBC 19.0* 14.4* 14.4*  PLT 218 197 199   Ct Head Wo Contrast  Result Date: 11/03/2017 CLINICAL DATA:  Found in car unresponsive. EXAM: CT HEAD WITHOUT CONTRAST TECHNIQUE: Contiguous axial images were obtained from the base of the skull through the vertex without intravenous contrast. COMPARISON:  None. FINDINGS: Brain: Complete effacement of the cerebral sulci may be related to the patient's young age and robust baseline brain volume as gray-white differentiation appears preserved throughout and there is no frank herniation to suggest the presence of diffuse cerebral edema. There is no evidence of acute infarct, intracranial hemorrhage, mass, or extra-axial fluid collection. Vascular: No hyperdense vessel. Skull: No fracture or focal osseous lesion. Sinuses/Orbits: Visualized paranasal sinuses and mastoid air cells are clear. Orbits are unremarkable. Other: None. IMPRESSION: 1.  No definite acute intracranial abnormality identified. 2. Paucity of CSF without additional findings of diffuse hypoxic-ischemic injury/cerebral edema. These results were called by telephone at the time of  interpretation on 11/03/2017 at 11:15 am to Dr. Bethann BerkshireJOSEPH ZAMMIT , who verbally acknowledged these results. Electronically Signed   By: Sebastian AcheAllen  Grady M.D.   On: 11/03/2017 11:39   Mr Brain Wo Contrast  Result Date: 11/04/2017 CLINICAL DATA:  Initial evaluation for acute altered mental status, concern for possible cerebral anoxia. EXAM: MRI HEAD WITHOUT CONTRAST MRV HEAD WITHOUT CONTRAST TECHNIQUE: Multiplanar, multiecho pulse sequences of the brain and surrounding structures were obtained without intravenous contrast. Angiographic images of the intracranial venous structures were obtained using MRV technique without intravenous contrast. COMPARISON:  Prior CT from earlier the same day. FINDINGS: MRI HEAD FINDINGS Cerebral volume within normal limits for age. No focal parenchymal signal abnormality identified. Gray-white matter differentiation maintained. No significant gyral swelling, edema, or diffusion abnormality to suggest global cerebral anoxia. No evidence for acute or subacute infarct. No foci of susceptibility artifact to suggest acute or chronic intracranial hemorrhage. Subtly increased FLAIR signal seen within the cortical sulci of the bilateral parieto-occipital regions, suspected to be most likely related to FIO2 administration. No mass lesion, midline shift or mass effect. No hydrocephalus. No extra-axial fluid collection. Pituitary gland within normal limits. Suprasellar region normal. Midline structures intact and normal. Major intracranial vascular flow voids are maintained. Craniocervical junction within normal limits. Upper cervical spine normal. Bone marrow signal intensity normal. No scalp soft tissue abnormality. Globes and orbital soft tissues within normal limits. Mild scattered mucosal thickening throughout the paranasal sinuses. No significant mastoid effusion. Inner ear structures grossly normal. MRV FINDINGS Normal flow related signal seen within the superior sagittal sinus to the level of  the torcula. Transverse and sigmoid sinuses are patent as are the proximal internal jugular veins. Right transverse sinus dominant. Straight sinus, vein of Galen, and internal cerebral veins are patent as are the basal veins of Rosenthal. No appreciable dural sinus stenosis. IMPRESSION: MRI HEAD IMPRESSION 1. Subtly increased FLAIR signal seen within multiple cortical sulci of the bilateral parieto-occipital regions, nonspecific, but favored to be related to FiO2 administration in this intubated patient. If there is clinical concern for possible acute meningitis, then correlation with LP and CSF analysis is recommended. 2. Otherwise normal brain MRI. No evidence for cerebral anoxia or other abnormality. MRV HEAD IMPRESSION Normal intracranial MRV.  No evidence for dural sinus thrombosis. Electronically Signed   By: Rise MuBenjamin  McClintock M.D.   On: 11/04/2017 01:10   Dg Chest Port 1 View  Result Date: 11/04/2017 CLINICAL DATA:  Patient on ventilator.  Evaluate ETT. EXAM: PORTABLE CHEST 1 VIEW COMPARISON:  November 03, 2017 FINDINGS: The ETT terminates 2.7 cm above the carina. The OG tube terminates below today's film. No pneumothorax. No nodules or masses. Mild opacity in the right base is stable. No other interval changes. IMPRESSION: 1. Support apparatus as above. 2. Mild opacity in the right base. Recommend attention on follow-up. Electronically Signed   By: Gerome Samavid  Williams III M.D   On: 11/04/2017 07:59   Dg Chest Port 1 View  Result Date: 11/03/2017 CLINICAL DATA:  22 year old male status post intubation. EXAM: PORTABLE CHEST 1 VIEW COMPARISON:  Chest radiograph dated 11/03/2017 FINDINGS: There has been interval placement of an endotracheal tube with tip approximately 3 cm above the carina. An enteric tube extends below the diaphragm to the right side of the spine with tip beyond the inferior margin  of the image. There are bilateral interstitial streaky densities. Bibasilar densities, right greater than left  may represent atelectatic changes or infiltrate. Trace right pleural effusion may be present. There is no pneumothorax. Stable cardiac silhouette. No acute osseous pathology. IMPRESSION: 1. Interval placement of endotracheal and enteric tubes. 2. Mild interstitial and bibasilar streaky densities, possibly atelectatic changes. Clinical correlation is recommended. Electronically Signed   By: Elgie Collard M.D.   On: 11/03/2017 21:26   Dg Chest Port 1 View  Result Date: 11/03/2017 CLINICAL DATA:  Patient found unresponsive. EXAM: PORTABLE CHEST 1 VIEW COMPARISON:  None. FINDINGS: Lungs are adequately inflated and otherwise clear. Cardiomediastinal silhouette, bones and soft tissues are normal. IMPRESSION: No active disease. Electronically Signed   By: Elberta Fortis M.D.   On: 11/03/2017 10:42   Mr Mrv Head Wo Cm  Result Date: 11/04/2017 CLINICAL DATA:  Initial evaluation for acute altered mental status, concern for possible cerebral anoxia. EXAM: MRI HEAD WITHOUT CONTRAST MRV HEAD WITHOUT CONTRAST TECHNIQUE: Multiplanar, multiecho pulse sequences of the brain and surrounding structures were obtained without intravenous contrast. Angiographic images of the intracranial venous structures were obtained using MRV technique without intravenous contrast. COMPARISON:  Prior CT from earlier the same day. FINDINGS: MRI HEAD FINDINGS Cerebral volume within normal limits for age. No focal parenchymal signal abnormality identified. Gray-white matter differentiation maintained. No significant gyral swelling, edema, or diffusion abnormality to suggest global cerebral anoxia. No evidence for acute or subacute infarct. No foci of susceptibility artifact to suggest acute or chronic intracranial hemorrhage. Subtly increased FLAIR signal seen within the cortical sulci of the bilateral parieto-occipital regions, suspected to be most likely related to FIO2 administration. No mass lesion, midline shift or mass effect. No  hydrocephalus. No extra-axial fluid collection. Pituitary gland within normal limits. Suprasellar region normal. Midline structures intact and normal. Major intracranial vascular flow voids are maintained. Craniocervical junction within normal limits. Upper cervical spine normal. Bone marrow signal intensity normal. No scalp soft tissue abnormality. Globes and orbital soft tissues within normal limits. Mild scattered mucosal thickening throughout the paranasal sinuses. No significant mastoid effusion. Inner ear structures grossly normal. MRV FINDINGS Normal flow related signal seen within the superior sagittal sinus to the level of the torcula. Transverse and sigmoid sinuses are patent as are the proximal internal jugular veins. Right transverse sinus dominant. Straight sinus, vein of Galen, and internal cerebral veins are patent as are the basal veins of Rosenthal. No appreciable dural sinus stenosis. IMPRESSION: MRI HEAD IMPRESSION 1. Subtly increased FLAIR signal seen within multiple cortical sulci of the bilateral parieto-occipital regions, nonspecific, but favored to be related to FiO2 administration in this intubated patient. If there is clinical concern for possible acute meningitis, then correlation with LP and CSF analysis is recommended. 2. Otherwise normal brain MRI. No evidence for cerebral anoxia or other abnormality. MRV HEAD IMPRESSION Normal intracranial MRV.  No evidence for dural sinus thrombosis. Electronically Signed   By: Rise Mu M.D.   On: 11/04/2017 01:10    ASSESSMENT / PLAN:  Acute metabolic encephalopathy - CT, MRI neg acute  ??serotonin syndrome r/t synthetic THC - presented with hyperreflexia, borderline fever, agitation, lactic acidosis  H/o polysubstance abuse - UDS positive for THC (synthetic) ETOH withdrawal? PLAN -  Neurology has evaluated essentially signed off No apparent withdrawal symptoms at this time Transfer to floor anticipate discharge within 48  hours   HTN - improved  PLAN -  2D echo has not been done   Acute respiratory  failure with hypoxia ?aspiration PNA  PLAN -  Extubated 11/04/2017 No apparent aspiration Off antibiotics 11/05/2088  Elevated LFT's - mild  Hx ETOH  PLAN -  Start regular diet Continue thiamine folic acid   Heme- no active issue  PLAN -  DVT prophylaxis   Anion Gap metabolic acidosis secondary to Lactic acidosis - improved  Recent Labs  Lab 11/03/17 2042 11/04/17 0058 11/05/17 0421  K 4.0 3.6 3.2*    PLAN -  F/u chem  Trend lactate  Replete potassium    DISPOSITION:[facility], transferred to floor 11/05/2017 PROGNOSIS: Good FAMILY: No family at bedside 11/05/2017 CODE STATUS: Full   Brett Canales Minor ACNP Adolph Pollack PCCM Pager [phone] till 1 pm If no answer page [phone] 11/05/2017, 8:58 AM

## 2017-11-05 NOTE — Progress Notes (Signed)
[facility] FOR AM LAB REPLACEMENT ONLY  The patient does apply for the [facility] Electrolyte Replac[facility] Protocol based on the criteria listed below:   1. Is GFR >/= 40 ml/min? Yes.    Patient's GFR today is >60 2. Is urine output >/= 0.5 ml/kg/hr for the last 6 hours? Yes.   Patient's UOP is 2.6 ml/kg/hr 3. Is BUN < 60 mg/dL? Yes.    Patient's BUN today is 5 4. Abnormal electrolyte(s): K+= 3.2 5. Ordered repletion with:per protocol 6. If a panic level lab has been reported, has the CCM MD in charge been notified? Yes.  .   Physician:  Dian SituSommer,Steve MD  Hurman HornStophel, Adryel Wortmann Parker 11/05/2017 6:23 AM

## 2017-11-05 NOTE — Progress Notes (Signed)
Spoke to pharmacy about melatonin for patient. New orders received, will continue to monitor.

## 2017-11-06 DIAGNOSIS — F1290 Cannabis use, unspecified, uncomplicated: Secondary | ICD-10-CM

## 2017-11-06 DIAGNOSIS — G9341 Metabolic encephalopathy: Secondary | ICD-10-CM

## 2017-11-06 DIAGNOSIS — F1910 Other psychoactive substance abuse, uncomplicated: Secondary | ICD-10-CM

## 2017-11-06 DIAGNOSIS — Z811 Family history of alcohol abuse and dependence: Secondary | ICD-10-CM

## 2017-11-06 DIAGNOSIS — F17210 Nicotine dependence, cigarettes, uncomplicated: Secondary | ICD-10-CM

## 2017-11-06 DIAGNOSIS — T401X1A Poisoning by heroin, accidental (unintentional), initial encounter: Secondary | ICD-10-CM

## 2017-11-06 LAB — CULTURE, RESPIRATORY W GRAM STAIN

## 2017-11-06 LAB — CBC WITH DIFFERENTIAL/PLATELET
Abs Immature Granulocytes: 0.1 10*3/uL (ref 0.0–0.1)
Basophils Absolute: 0 10*3/uL (ref 0.0–0.1)
Basophils Relative: 0 %
EOS PCT: 3 %
Eosinophils Absolute: 0.4 10*3/uL (ref 0.0–0.7)
HEMATOCRIT: 39.2 % (ref 39.0–52.0)
HEMOGLOBIN: 12.6 g/dL — AB (ref 13.0–17.0)
Immature Granulocytes: 1 %
LYMPHS ABS: 3.1 10*3/uL (ref 0.7–4.0)
LYMPHS PCT: 31 %
MCH: 25 pg — AB (ref 26.0–34.0)
MCHC: 32.1 g/dL (ref 30.0–36.0)
MCV: 77.9 fL — ABNORMAL LOW (ref 78.0–100.0)
MONO ABS: 1.1 10*3/uL — AB (ref 0.1–1.0)
Monocytes Relative: 11 %
Neutro Abs: 5.6 10*3/uL (ref 1.7–7.7)
Neutrophils Relative %: 54 %
Platelets: 244 10*3/uL (ref 150–400)
RBC: 5.03 MIL/uL (ref 4.22–5.81)
RDW: 13.2 % (ref 11.5–15.5)
WBC: 10.2 10*3/uL (ref 4.0–10.5)

## 2017-11-06 LAB — BASIC METABOLIC PANEL
Anion gap: 11 (ref 5–15)
BUN: 8 mg/dL (ref 6–20)
CHLORIDE: 106 mmol/L (ref 98–111)
CO2: 24 mmol/L (ref 22–32)
CREATININE: 0.87 mg/dL (ref 0.61–1.24)
Calcium: 9 mg/dL (ref 8.9–10.3)
GFR calc non Af Amer: >60 mL/min (ref >60)
GLUCOSE: 89 mg/dL (ref 70–99)
Potassium: 3.4 mmol/L — ABNORMAL LOW (ref 3.5–5.1)
Sodium: 141 mmol/L (ref 135–145)

## 2017-11-06 LAB — HERPES SIMPLEX VIRUS(HSV) DNA BY PCR
HSV 1 DNA: NEGATIVE
HSV 2 DNA: NEGATIVE

## 2017-11-06 LAB — MAGNESIUM: Magnesium: 2.1 mg/dL (ref 1.7–2.4)

## 2017-11-06 LAB — PHOSPHORUS: PHOSPHORUS: 3.8 mg/dL (ref 2.5–4.6)

## 2017-11-06 LAB — OCCULT BLOOD X 1 CARD TO LAB, STOOL: Fecal Occult Bld: NEGATIVE

## 2017-11-06 LAB — CULTURE, RESPIRATORY: GRAM STAIN: NONE SEEN

## 2017-11-06 MED ORDER — PANTOPRAZOLE SODIUM 40 MG PO TBEC
40.0000 mg | DELAYED_RELEASE_TABLET | Freq: Every day | ORAL | Status: DC
  Administered 2017-11-06 – 2017-11-07 (×2): 40 mg via ORAL
  Filled 2017-11-06 (×2): qty 1

## 2017-11-06 MED ORDER — MELATONIN 3 MG PO TABS
3.0000 mg | ORAL_TABLET | Freq: Once | ORAL | Status: AC
  Administered 2017-11-06: 3 mg via ORAL
  Filled 2017-11-06: qty 1

## 2017-11-06 MED ORDER — DOXYCYCLINE HYCLATE 100 MG PO TABS
100.0000 mg | ORAL_TABLET | Freq: Two times a day (BID) | ORAL | Status: DC
  Administered 2017-11-06 – 2017-11-07 (×3): 100 mg via ORAL
  Filled 2017-11-06 (×3): qty 1

## 2017-11-06 MED ORDER — WHITE PETROLATUM EX OINT
TOPICAL_OINTMENT | CUTANEOUS | Status: DC | PRN
  Administered 2017-11-06: 22:00:00 via TOPICAL
  Filled 2017-11-06 (×2): qty 28.35

## 2017-11-06 MED ORDER — NICOTINE 21 MG/24HR TD PT24
21.0000 mg | MEDICATED_PATCH | Freq: Every day | TRANSDERMAL | Status: DC
  Administered 2017-11-06 (×2): 21 mg via TRANSDERMAL
  Filled 2017-11-06 (×3): qty 1

## 2017-11-06 MED ORDER — POTASSIUM CHLORIDE CRYS ER 20 MEQ PO TBCR
40.0000 meq | EXTENDED_RELEASE_TABLET | Freq: Once | ORAL | Status: AC
  Administered 2017-11-06: 40 meq via ORAL
  Filled 2017-11-06: qty 2

## 2017-11-06 NOTE — Progress Notes (Signed)
Per Dr. Sharma CovertNorman Patient is not a candidate for involuntary commitment due to accidental heroin overdose.

## 2017-11-06 NOTE — Progress Notes (Signed)
Subjective: Continues to improve.  Remember some of her discussion from yesterday.  Exam: Vitals:   11/06/17 0600 11/06/17 1559  BP: 115/70 137/85  Pulse: (!) 58 71  Resp: 18 18  Temp: 98.3 F (36.8 C) 99 F (37.2 C)  SpO2: 97% 95%   Gen: In bed, intubated Resp: ventilated Abd: soft, nt  Neuro: MS: Awake, alert, follows commands briskly.  He is oriented to month, year, place. CN: PERRL, tracks across midline in both directions,  Motor: follows commands with good strength x 4.  Sensory: intact to LT DTR: 2+ and symmetric     Impression: 22 yo M presenting with opsoclonus, hyperreflexia, fever, agitation, borderline fever, lactic acidosis who continues to improve.  I think that possibilities include serotonin syndrome, particularly if he used synthetic cannibinoids, or could be direct effect of overdose.  This type of anterograde amnesia can be seen with fentanyl overdose but typically there are MRI findings as well.   Recommendations: 1) Can d/c antibiotics from neuro standpoint.  2) at this point care is supportive, no further  Neurodiagnostic testing at this time.  Please call with further questions or concerns.   Ritta SlotMcNeill Meliss Fleek, MD [facility] [phone]  If 7pm- 7am, please page neurology on call as listed in AMION.

## 2017-11-06 NOTE — Progress Notes (Signed)
CSWs received consult regarding homeless issues and substance use. CSW and CSW in training spoke with patient. He stated that he was staying with a friend that is a [facility] when they both wanted to "go out with a bang" before his friend went into a treatment program. Patient stated he is afraid of needles so he snorts cocaine. He had been to [facility]'s program earlier this year but has been staying with clean friends since then until he felt the urge to do cocaine again. He stated it was not a good experience to overdose. He accepted resources and stated that he has done many outpatient programs as well and his insurance always cuts him off. Patient's father (A psychiatrist) stated that he was not paying for housing for patient unless he moved back to [place] with them. Patient stated that he was not moving back to [place] and accepted housing shelter resources. He states he has been to many shelters before, [place].   CSW signing off.  Osborne Cascoadia Mitsuko Luera LCSW [phone]

## 2017-11-06 NOTE — Progress Notes (Signed)
Pt with a brown/maroon colored stool. Reports mild abdominal cramping that resolved after BM. Pt reports this is the first episode and denies acute distress. Dr. Arsenio LoaderSommer notified, new orders received. Will monitor.

## 2017-11-06 NOTE — Progress Notes (Signed)
[facility] PROGRESS NOTE  Alex Potter WGN:[id number] DOB: January 05, 1996 DOA: 11/03/2017  PCP: Patient, No Pcp Per  Brief History/Interval Summary: 22 year old Caucasian male with a past medical history significant for polysubstance abuse who spent a lot of time in rehab facilities who presented with altered mental status.  There was concern for drug overdose.  CT scan raised concern for hypoxic ischemic injury and cerebral edema.  Seen by neurology.  MRI was recommended.  For this patient had to be intubated.  He was transferred to the ICU service.  And [facility]  Reason for Visit: Acute metabolic encephalopathy  Consultants: Neurology  Procedures: Intubated for airway protection  Antibiotics: He was on vancomycin and Zosyn Flagyl and Unasyn.  All of these were discontinued.  Subjective/Interval History: Patient mentions that he has had 2-3 episodes of maroon-colored stool.  Denies any abdominal pain.  No nausea vomiting.  No previous history of same.  ROS: Denies any headaches  Objective:  Vital Signs  Vitals:   11/05/17 1000 11/05/17 1424 11/05/17 2204 11/06/17 0600  BP: 126/77 136/71 129/71 115/70  Pulse: (!) 58 63 (!) 56 (!) 58  Resp: (!) 29 18 19 18   Temp:  98.9 F (37.2 C) 100.3 F (37.9 C) 98.3 F (36.8 C)  TempSrc:  Oral Oral Oral  SpO2: 93% 97% 96% 97%  Weight:      Height:        Intake/Output Summary (Last 24 hours) at 11/06/2017 1339 Last data filed at 11/06/2017 0600 Gross per 24 hour  Intake 600 ml  Output -  Net 600 ml   Filed Weights   11/03/17 1633 11/04/17 0500 11/05/17 0500  Weight: 84.7 kg (186 lb 11.7 oz) 84.7 kg (186 lb 11.7 oz) 82.4 kg (181 lb 10.5 oz)    General appearance: alert, cooperative, appears stated age and no distress Head: Normocephalic, without obvious abnormality, atraumatic Resp: clear to auscultation bilaterally Cardio: regular rate and rhythm, S1, S2 normal, no murmur, click, rub or gallop GI:  soft, non-tender; bowel sounds normal; no masses,  no organomegaly.  No external hemorrhoids noted on examination Extremities: extremities normal, atraumatic, no cyanosis or edema Neurologic: No obvious focal neurological deficits  Lab Results:  Data Reviewed: I have personally reviewed following labs and imaging studies  CBC: Recent Labs  Lab 11/03/17 0925 11/03/17 0930 11/03/17 2042 11/04/17 0058 11/05/17 0421 11/06/17 0624  WBC 19.5*  --  19.0* 14.4* 14.4* 10.2  NEUTROABS 16.1*  --  14.3*  --   --  5.6  HGB 14.5 15.6 12.5* 11.4* 11.5* 12.6*  HCT 45.3 46.0 38.4* 36.3* 35.7* 39.2  MCV 81.6  --  79.3 80.5 78.6 77.9*  PLT 308  --  218 197 199 244    Basic Metabolic Panel: Recent Labs  Lab 11/03/17 0925 11/03/17 0930 11/03/17 2042 11/04/17 0058 11/05/17 0421 11/06/17 0624  NA 146* 145 144 141 140 141  K 4.0 4.1 4.0 3.6 3.2* 3.4*  CL 108 106 112* 109 103 106  CO2 24  --  24 26 24 24   GLUCOSE 125* 119* 92 97 83 89  BUN 15 15 11 9 6 8   CREATININE 1.10 1.00 0.91 0.86 0.94 0.87  CALCIUM 9.5  --  8.2* 8.4* 8.5* 9.0  MG  --   --  1.7 1.8  --  2.1  PHOS  --   --  3.2 2.6  --  3.8    GFR: Estimated Creatinine Clearance: 137.5 mL/min (  by C-G formula based on SCr of 0.87 mg/dL).  Liver Function Tests: Recent Labs  Lab 11/03/17 0925 11/03/17 2042  AST 117* 82*  ALT 139* 104*  ALKPHOS 77 50  BILITOT 0.4 0.7  PROT 7.8 5.9*  ALBUMIN 4.0 2.9*     Recent Labs  Lab 11/03/17 1618  AMMONIA 38*    Coagulation Profile: Recent Labs  Lab 11/03/17 1625  INR 1.09    CBG: Recent Labs  Lab 11/03/17 1818  GLUCAP 113*    Lipid Profile: Recent Labs    11/03/17 2042 11/04/17 0058  TRIG 90 139    Thyroid Function Tests: Recent Labs    11/03/17 1625  TSH 0.938     Recent Results (from the past 240 hour(s))  Blood Culture (routine x 2)     Status: None (Preliminary result)   Collection Time: 11/03/17 10:20 AM  Result Value Ref Range Status   Specimen  Description   Final    RIGHT ANTECUBITAL Performed at [facility], [address] [place], [place] [address]    Special Requests   Final    BOTTLES DRAWN AEROBIC AND ANAEROBIC Blood Culture results may not be optimal due to an excessive volume of blood received in culture bottles Performed at [facility], [address], [place], [place] [address]    Culture   Final    NO GROWTH 3 DAYS Performed at [facility], [address], [place], [place] [address]    Report Status PENDING  Incomplete  Blood Culture (routine x 2)     Status: None (Preliminary result)   Collection Time: 11/03/17 10:20 AM  Result Value Ref Range Status   Specimen Description   Final    BLOOD LEFT HAND Performed at [facility], [address], [place], [place] [address]    Special Requests   Final    BOTTLES DRAWN AEROBIC AND ANAEROBIC Blood Culture adequate volume Performed at [facility], [address] [place], [place] [address]    Culture   Final    NO GROWTH 3 DAYS Performed at [facility], [address] [place], [place] [address]    Report Status PENDING  Incomplete  Culture, respiratory (NON-Expectorated)     Status: None   Collection Time: 11/03/17 10:33 PM  Result Value Ref Range Status   Specimen Description TRACHEAL ASPIRATE  Final   Special Requests NONE  Final   Gram Stain   Final    NO WBC SEEN NO ORGANISMS SEEN Performed at [facility], [address], [place], [place] [address]    Culture FEW METHICILLIN RESISTANT STAPHYLOCOCCUS AUREUS  Final   Report Status 11/06/2017 FINAL  Final   Organism ID, Bacteria METHICILLIN RESISTANT STAPHYLOCOCCUS AUREUS  Final      Susceptibility   Methicillin resistant staphylococcus aureus - MIC*    CIPROFLOXACIN <=0.5 SENSITIVE Sensitive     ERYTHROMYCIN <=0.25 SENSITIVE Sensitive     GENTAMICIN <=0.5 SENSITIVE Sensitive     OXACILLIN >=4 RESISTANT  Resistant     TETRACYCLINE <=1 SENSITIVE Sensitive     VANCOMYCIN 2 SENSITIVE Sensitive     TRIMETH/SULFA <=10 SENSITIVE Sensitive     CLINDAMYCIN <=0.25 SENSITIVE Sensitive     RIFAMPIN <=0.5 SENSITIVE Sensitive     Inducible Clindamycin NEGATIVE Sensitive     * FEW METHICILLIN RESISTANT STAPHYLOCOCCUS AUREUS  MRSA PCR Screening     Status: Abnormal   Collection  Time: 11/03/17 10:36 PM  Result Value Ref Range Status   MRSA by PCR POSITIVE (A) NEGATIVE Final    Comment: RESULT CALLED TO, READ BACK BY AND VERIFIED WITH: DICKSON,G RN [phone] 11/04/17 MITCHELL,L        The GeneXpert MRSA Assay (FDA approved for NASAL specimens only), is one component of a comprehensive MRSA colonization surveillance program. It is not intended to diagnose MRSA infection nor to guide or monitor treatment for MRSA infections.   Respiratory Panel by PCR     Status: None   Collection Time: 11/04/17 12:40 AM  Result Value Ref Range Status   Adenovirus NOT DETECTED NOT DETECTED Final   Coronavirus 229E NOT DETECTED NOT DETECTED Final   Coronavirus HKU1 NOT DETECTED NOT DETECTED Final   Coronavirus NL63 NOT DETECTED NOT DETECTED Final   Coronavirus OC43 NOT DETECTED NOT DETECTED Final   Metapneumovirus NOT DETECTED NOT DETECTED Final   Rhinovirus / Enterovirus NOT DETECTED NOT DETECTED Final   Influenza A NOT DETECTED NOT DETECTED Final   Influenza B NOT DETECTED NOT DETECTED Final   Parainfluenza Virus 1 NOT DETECTED NOT DETECTED Final   Parainfluenza Virus 2 NOT DETECTED NOT DETECTED Final   Parainfluenza Virus 3 NOT DETECTED NOT DETECTED Final   Parainfluenza Virus 4 NOT DETECTED NOT DETECTED Final   Respiratory Syncytial Virus NOT DETECTED NOT DETECTED Final   Bordetella pertussis NOT DETECTED NOT DETECTED Final   Chlamydophila pneumoniae NOT DETECTED NOT DETECTED Final   Mycoplasma pneumoniae NOT DETECTED NOT DETECTED Final    Comment: Performed at [facility], [address],  [place], [place] [address]  CSF culture with Stat gram stain     Status: None (Preliminary result)   Collection Time: 11/04/17 12:59 PM  Result Value Ref Range Status   Specimen Description CSF  Final   Special Requests NONE  Final   Gram Stain NO WBC SEEN NO ORGANISMS SEEN   Final   Culture   Final    NO GROWTH 2 DAYS Performed at [facility], [address], [place], [place] [address]    Report Status PENDING  Incomplete      Radiology Studies: No results found.   Medications:  Scheduled: . Chlorhexidine Gluconate Cloth  6 each Topical Q0600  . doxycycline  100 mg Oral Q12H  . folic acid  1 mg Oral Daily  . mupirocin ointment  1 application Nasal BID  . nicotine  21 mg Transdermal Daily  . thiamine  100 mg Oral Daily   Continuous: . sodium chloride Stopped (11/05/17 0238)   [id number] chloride, acetaminophen **OR** acetaminophen  Assessment/Plan:    Acute metabolic encephalopathy Mental status appears to be slowly improving.  Patient was seen by neurology.  Patient underwent lumbar puncture.  CSF was unremarkable for infection.  Encephalopathy thought to be secondary to drug overdose.  Patient admitted to taking heroin which likely had fentanyl as urine drug screen was negative for opioids.  No further neurological work-up at this time.  Hematochezia Apparently has passed blood in the stool.  Some mention of dark stool as well.  No previous history of same.  No external hemorrhoids noted on exam.  Hemoglobin is stable this morning.  Continue to observe for now.  Initiate PPI as well.  Fecal occult blood testing is negative however.  History of polysubstance abuse Urine drug screen was positive for THC.  There was some concern for synthetic THC use causing serotonin syndrome.  However  this remains unclear.  Patient apparently involuntarily committed.  Will request psychiatry to see the patient.  Elevated blood pressure Blood pressure is now normal.  Likely elevated  due to acute illness.  No further work-up at this time.  Acute respiratory failure with hypoxia with concern for aspiration Imaging studies done initially did not show any pneumonia.  The last chest x-ray does raise concern for opacity in the right lung.  Tracheal aspirate positive for MRSA.  Patient denies any respiratory symptoms.  Treat with doxycycline for now.  Abnormal LFTs Possibly due to acute illness and alcohol.  Recheck tomorrow.  DVT Prophylaxis: SCDs    Code Status: Full Code Family Communication: Discussed with the patient and his father Disposition Plan: Management as outlined above.  Await psychiatry input.    LOS: 3 days   Osvaldo Shipper  [facility] Pager [phone] 11/06/2017, 1:39 PM  If 7PM-7AM, please contact night-coverage at [email or web address], password [facility]

## 2017-11-06 NOTE — Progress Notes (Signed)
eLink Physician-Brief Progress Note Patient Name: Alex LiasJames Potter DOB: 1995-08-14 MRN: [id number]   Date of Service  11/06/2017  HPI/Events of Note  Multiple issues: 1. Episode of dark stool - ?Melena - BP = 129/71 and HR = 56, 2. Patient requests Nicotine patch and 3. Patient request Melatonin for sleep.  eICU Interventions  Will order: 1. Stool for occult blood. 2. CBC in AM. 3. D/C Lovenox [place]. 4. SCDs to bilateral lower extremities.  5. Nicotine patch 21 mg to skin now and Q day. 6. Melatonin 3 mg PO X 1 now.      Intervention Category Major Interventions: Other:  Alex Potter 11/06/2017, 1:27 AM

## 2017-11-06 NOTE — Consult Note (Addendum)
[facility] Consult   Reason for Consult:  Drug overdose  Referring Physician:  Dr. Maryland Pink  Patient Identification: Alex Potter MRN:  [id number] Principal Diagnosis: Heroin overdose, accidental or unintentional, initial encounter [facility]) Diagnosis:   Patient Active Problem List   Diagnosis Date Noted  . Acute encephalopathy [G93.40]   . Sepsis ([place]) [A41.9] 11/03/2017  . Acute metabolic encephalopathy [J19.41] 11/03/2017  . Acute respiratory failure with hypoxia ([place]) [J96.01] 11/03/2017  . Aspiration into airway [T17.908A]   . Lactic acidosis [E87.2]   . Choledocholithiasis [K80.50]   . Abnormal liver function [R94.5] 07/04/2017  . Depression [F32.9] 07/04/2017  . Acute cholecystitis [K81.0] 07/03/2017  . Gallstone [K80.20] 07/03/2017  . Right upper quadrant abdominal pain [R10.11] 07/03/2017  . Cholecystitis [K81.9] 07/03/2017  . Tobacco abuse [Z72.0]   . Drug abuse ([place]) [F19.10]     Total Time spent with patient: 1 hour  Subjective:   Alex Potter is a 22 y.o. male patient admitted with altered mental status.  HPI:   Per chart review, patient was admitted after he was found unresponsive in his car. He was given 2 doses of Narcan with improvement. He was combative and agitated on admission. He was sedated and intubated to undergo elective MRI. He was seen by neurology and exam was notable for opsoclonus, hyperreflexia, agitation, fever and lactic acidosis. Serotonin syndrome is suspected in the setting of cannabinoid use. EEG was unremarkable. He has a history of polysubstance abuse. He was treated at [facility] in February for substance abuse and has been living in the [facility] since discharge. His father is concerned that he may have relapsed again. UDS was positive for THC and BAL was negative.   On interview, Alex Potter reports using heroin for the first time prior to hospitalization.  He did not realize how much he was using.  He denies thoughts to  harm himself at this time.  He reports that he was sober for almost 6 months.  He was kicked out of the [employer] about a week ago because he lied about going to [employer].  He reports that this contributed to his relapse.  In the past, he reports using methamphetamine and marijuana.  He also reports a history of heavy alcohol use.  He denies a history of DTs or seizures.  He reports mild depression due to his current situation.  He has been living in his car and with friends.  He reports that his family is a stressor.  His dad is an addiction psychiatrist.  He does not feel like his dad shows him empathy in regards to his addiction.  He denies problems with sleep or appetite.   Patient provided verbal consent to speak to his father, Dr. Nyoka Cowden. His father does not believe that he was intentionally trying to harm himself.  He does report that he thinks that has been using drugs for longer than he states with recent relapse.  He reports finding a bag of needles that were both old and new in the patient's car.  He also had other drug paraphernalia in the car.  His father has offered to help him get back into the [facility] in [place] but he has declined.  Past Psychiatric History: Polysubstance abuse. He reports a history of cutting and denies a prior history of suicide attempts.   Risk to Self:  None. Denies SI.  Risk to Others:  None. Denies HI. Prior Inpatient Therapy:   He completed rehab  for alcohol use in February and March. 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  Prior Outpatient Therapy:  Denies   Past Medical History:  Past Medical History:  Diagnosis Date  . Drug abuse ([place])   . Tobacco abuse     Past Surgical History:  Procedure Laterality Date  . CHOLECYSTECTOMY N/A 07/05/2017   Procedure: LAPAROSCOPIC CHOLECYSTECTOMY WITH INTRAOPERATIVE CHOLANGIOGRAM;  Surgeon: Judeth Horn, MD;  Location: [facility];  Service: General;  Laterality:  N/A;  . ERCP N/A 07/04/2017   Procedure: ENDOSCOPIC RETROGRADE CHOLANGIOPANCREATOGRAPHY (ERCP);  Surgeon: Doran Stabler, MD;  Location: [facility];  Service: Gastroenterology;  Laterality: N/A;  . INGUINAL HERNIA REPAIR Right    Family History: No family history on file. Family Psychiatric  History: He is adopted. His biological mother and father were alcoholics and abused other substances.  Social History:  Social History   Substance and Sexual Activity  Alcohol Use Yes     Social History   Substance and Sexual Activity  Drug Use Yes  . Types: Marijuana, Methamphetamines    Social History   Socioeconomic History  . Marital status: Single    Spouse name: Not on file  . Number of children: Not on file  . Years of education: Not on file  . Highest education level: Not on file  Occupational History  . Not on file  Social Needs  . Financial resource strain: Not on file  . Food insecurity:    Worry: Not on file    Inability: Not on file  . Transportation needs:    Medical: Not on file    Non-medical: Not on file  Tobacco Use  . Smoking status: Current Every Day Smoker  . Smokeless tobacco: Never Used  Substance and Sexual Activity  . Alcohol use: Yes  . Drug use: Yes    Types: Marijuana, Methamphetamines  . Sexual activity: Not on file  Lifestyle  . Physical activity:    Days per week: Not on file    Minutes per session: Not on file  . Stress: Not on file  Relationships  . Social connections:    Talks on phone: Not on file    Gets together: Not on file    Attends religious service: Not on file    Active member of club or organization: Not on file    Attends meetings of clubs or organizations: Not on file    Relationship status: Not on file  Other Topics Concern  . Not on file  Social History Narrative  . Not on file   Additional Social History: He is from [place], [place]. He is currently homeless. He is adopted. He has 5 biological siblings. He  is close to two of them. He has no children and is single. He works at [employer]. He reports being sober for 6 months until recent relapse. He used methamphetamine, alcohol and heroin prior to hospitalization. He has a history of DUI.     Allergies:   Allergies  Allergen Reactions  . Mushroom Extract Complex Itching and Swelling    Labs:  Results for orders placed or performed during the hospital encounter of 11/03/17 (from the past 48 hour(s))  Lactic acid, plasma     Status: Abnormal   Collection Time: 11/04/17 12:23 PM  Result Value Ref Range   Lactic Acid, Venous 2.5 (HH) 0.5 - 1.9 mmol/L    Comment: CRITICAL RESULT CALLED TO, READ BACK BY AND  VERIFIED WITHVernie Potter [id number] WILDERK Performed at [facility] [address] [place], [place] [address]   CSF cell count with differential collection tube #: 1 and 4     Status: Abnormal   Collection Time: 11/04/17 12:59 PM  Result Value Ref Range   Tube # 1 and 4    Color, CSF COLORLESS COLORLESS   Appearance, CSF CLEAR CLEAR   Supernatant NOT INDICATED    RBC Count, CSF 3 (H) 0 /cu mm   WBC, CSF 2 0 - 5 /cu mm   Lymphs, CSF FEW 40 - 80 %   Monocyte-Macrophage-Spinal Fluid RARE 15 - 45 %   Other Cells, CSF TOO FEW TO COUNT, SMEAR AVAILABLE FOR REVIEW     Comment: Performed at [facility] [address] [place], [place] [address]  CSF culture with Stat gram stain     Status: None (Preliminary result)   Collection Time: 11/04/17 12:59 PM  Result Value Ref Range   Specimen Description CSF    Special Requests NONE    Gram Stain NO WBC SEEN NO ORGANISMS SEEN     Culture      NO GROWTH 2 DAYS Performed at [facility], [place] [address], [place], [place] [address]    Report Status PENDING   Protein and glucose, CSF     Status: None   Collection Time: 11/04/17 12:59 PM  Result Value Ref Range   Glucose, CSF 57 40 - 70 mg/dL   Total  Protein, CSF 22 15 - 45 mg/dL    Comment: Performed at [facility] [address] [place], [place] [address]  CBC     Status: Abnormal   Collection Time: 11/05/17  4:21 AM  Result Value Ref Range   WBC 14.4 (H) 4.0 - 10.5 K/uL   RBC 4.54 4.22 - 5.81 MIL/uL   Hemoglobin 11.5 (L) 13.0 - 17.0 g/dL   HCT 35.7 (L) 39.0 - 52.0 %   MCV 78.6 78.0 - 100.0 fL   MCH 25.3 (L) 26.0 - 34.0 pg   MCHC 32.2 30.0 - 36.0 g/dL   RDW 13.2 11.5 - 15.5 %   Platelets 199 150 - 400 K/uL    Comment: Performed at [facility], [place] [address], [place], [place] [address]  Basic metabolic panel     Status: Abnormal   Collection Time: 11/05/17  4:21 AM  Result Value Ref Range   Sodium 140 135 - 145 mmol/L   Potassium 3.2 (L) 3.5 - 5.1 mmol/L   Chloride 103 98 - 111 mmol/L   CO2 24 22 - 32 mmol/L   Glucose, Bld 83 70 - 99 mg/dL   BUN 6 6 - 20 mg/dL   Creatinine, Ser 0.94 0.61 - 1.24 mg/dL   Calcium 8.5 (L) 8.9 - 10.3 mg/dL   GFR calc non Af Amer >60 >60 mL/min   GFR calc Af Amer >60 >60 mL/min    Comment: (NOTE) The eGFR has been calculated using the CKD EPI equation. This calculation has not been validated in all clinical situations. eGFR's persistently <60 mL/min signify possible Chronic Kidney Disease.    Anion gap 13 5 - 15    Comment: Performed at [facility] [address] [place], [place] [address]  Occult blood card to lab, stool RN will collect     Status: None   Collection Time: 11/06/17  6:18 AM  Result Value Ref Range   Fecal Occult Bld NEGATIVE NEGATIVE    Comment: Performed at [facility], [place] [address] [place], [place] [address]  BMET in AM     Status: Abnormal   Collection Time: 11/06/17  6:24 AM  Result Value Ref Range   Sodium 141 135 - 145 mmol/L   Potassium 3.4 (L) 3.5 - 5.1 mmol/L   Chloride 106 98 - 111 mmol/L    Comment: Please note change in reference range.   CO2 24 22 - 32 mmol/L   Glucose, Bld 89 70 - 99 mg/dL    Comment: Please note change in reference range.   BUN 8 6 - 20 mg/dL    Comment:  Please note change in reference range.   Creatinine, Ser 0.87 0.61 - 1.24 mg/dL   Calcium 9.0 8.9 - 10.3 mg/dL   GFR calc non Af Amer >60 >60 mL/min   GFR calc Af Amer >60 >60 mL/min    Comment: (NOTE) The eGFR has been calculated using the CKD EPI equation. This calculation has not been validated in all clinical situations. eGFR's persistently <60 mL/min signify possible Chronic Kidney Disease.    Anion gap 11 5 - 15    Comment: Performed at [facility] [address] [place], [place] [address]  Magnesium     Status: None   Collection Time: 11/06/17  6:24 AM  Result Value Ref Range   Magnesium 2.1 1.7 - 2.4 mg/dL    Comment: Performed at [facility] [address], [place], [place] [address]  Phosphorus     Status: None   Collection Time: 11/06/17  6:24 AM  Result Value Ref Range   Phosphorus 3.8 2.5 - 4.6 mg/dL    Comment: Performed at [facility] [address] [place], [place] [address]  CBC with Differential/Platelet     Status: Abnormal   Collection Time: 11/06/17  6:24 AM  Result Value Ref Range   WBC 10.2 4.0 - 10.5 K/uL   RBC 5.03 4.22 - 5.81 MIL/uL   Hemoglobin 12.6 (L) 13.0 - 17.0 g/dL   HCT 39.2 39.0 - 52.0 %   MCV 77.9 (L) 78.0 - 100.0 fL   MCH 25.0 (L) 26.0 - 34.0 pg   MCHC 32.1 30.0 - 36.0 g/dL   RDW 13.2 11.5 - 15.5 %   Platelets 244 150 - 400 K/uL   Neutrophils Relative % 54 %   Neutro Abs 5.6 1.7 - 7.7 K/uL   Lymphocytes Relative 31 %   Lymphs Abs 3.1 0.7 - 4.0 K/uL   Monocytes Relative 11 %   Monocytes Absolute 1.1 (H) 0.1 - 1.0 K/uL   Eosinophils Relative 3 %   Eosinophils Absolute 0.4 0.0 - 0.7 K/uL   Basophils Relative 0 %   Basophils Absolute 0.0 0.0 - 0.1 K/uL   Immature Granulocytes 1 %   Abs Immature Granulocytes 0.1 0.0 - 0.1 K/uL    Comment: Performed at [facility], [address] [place],  [address]    Current Facility-Administered Medications  Medication Dose Route Frequency Provider Last Rate  Last Dose  . 0.9 %  sodium chloride infusion  250 mL Intravenous PRN Minor, Grace Bushy, NP   Stopped at 11/05/17 0238  . acetaminophen (TYLENOL) tablet 650 mg  650 mg Oral Q6H PRN Minor, Grace Bushy, NP   650 mg at 11/06/17 0018   Or  . acetaminophen (TYLENOL) suppository  650 mg  650 mg Rectal Q6H PRN Minor, Grace Bushy, NP      . Chlorhexidine Gluconate Cloth 2 % PADS 6 each  6 each Topical Q0600 Tarry Kos, MD   6 each at 11/06/17 [phone]  . folic acid (FOLVITE) tablet 1 mg  1 mg Oral Daily Minor, Grace Bushy, NP   1 mg at 11/06/17 1103  . mupirocin ointment (BACTROBAN) 2 % 1 application  1 application Nasal BID Tarry Kos, MD   1 application at 66/59/93 1104  . nicotine (NICODERM CQ - dosed in mg/24 hours) patch 21 mg  21 mg Transdermal Daily Anders Simmonds, MD   21 mg at 11/06/17 1102  . thiamine (VITAMIN B-1) tablet 100 mg  100 mg Oral Daily Minor, Grace Bushy, NP   100 mg at 11/06/17 1103    Musculoskeletal: Strength & Muscle Tone: within normal limits Gait & Station: UTA since patient was lying in bed. Patient leans: N/A  Psychiatric Specialty Exam: Physical Exam  Nursing note and vitals reviewed. Constitutional: He is oriented to person, place, and time. He appears well-developed and well-nourished.  HENT:  Head: Normocephalic and atraumatic.  Neck: Normal range of motion.  Musculoskeletal: Normal range of motion.  Neurological: He is alert and oriented to person, place, and time.  Psychiatric: He has a normal mood and affect. His speech is normal and behavior is normal. Judgment and thought content normal. Cognition and memory are normal.    Review of Systems  Cardiovascular: Negative for chest pain.  Gastrointestinal: Negative for abdominal pain, constipation, diarrhea, nausea and vomiting.  Psychiatric/Behavioral: Positive for depression and substance abuse. Negative for hallucinations and suicidal ideas. The patient does not have insomnia.   All other systems  reviewed and are negative.   Blood pressure 115/70, pulse (!) 58, temperature 98.3 F (36.8 C), temperature source Oral, resp. rate 18, height '5\' 10"'  (1.778 m), weight 82.4 kg (181 lb 10.5 oz), SpO2 97 %.Body mass index is 26.07 kg/m.  General Appearance: Fairly Groomed, young, Caucasian male, wearing a hospital gown and lying in bed. NAD.   Eye Contact:  Good  Speech:  Clear and Coherent and Normal Rate  Volume:  Normal  Mood:  Depressed  Affect:  Appropriate and Full Range  Thought Process:  Goal Directed, Linear and Descriptions of Associations: Intact  Orientation:  Full (Time, Place, and Person)  Thought Content:  Logical  Suicidal Thoughts:  No  Homicidal Thoughts:  No  Memory:  Immediate;   Good Recent;   Good Remote;   Good  Judgement:  Fair  Insight:  Fair  Psychomotor Activity:  Normal  Concentration:  Concentration: Good and Attention Span: Good  Recall:  Good  Fund of Knowledge:  Good  Language:  Good  Akathisia:  No  Handed:  Right  AIMS (if indicated):   N/A  Assets:  Communication Skills Desire for Improvement Financial Resources/Insurance Physical Health Social Support  ADL's:  Intact  Cognition:  WNL  Sleep:   Okay   Assessment:  Alex Potter is a 22 y.o. male who was admitted with unintentional heroin overdose. He reports relapsing on substance use after almost 6 months of sobriety. He reports mild depression due to current situation but he denies SI, HI or AVH. He does not warrant inpatient psychiatric hospitalization at this time. Recommend SW provide patient with resources for substance abuse treatment and shelter.    Treatment Plan Summary: -Patient declines resources for substance abuse treatment. He  plans to return to NA meetings and work with his sponsor. Recommend providing patient with substance abuse treatment resources in the event that he needs them in the future. Patient will also need shelter resources.   -Patient is psychiatrically cleared.  Psychiatry will sign off on patient at this time. Please consult psychiatry again as needed.   Disposition: No evidence of imminent risk to self or others at present.   Patient does not meet criteria for psychiatric inpatient admission.  Faythe Dingwall, DO 11/06/2017 11:52 AM

## 2017-11-07 DIAGNOSIS — T401X1A Poisoning by heroin, accidental (unintentional), initial encounter: Principal | ICD-10-CM

## 2017-11-07 LAB — MISC LABCORP TEST (SEND OUT): LABCORP TEST CODE: 701106

## 2017-11-07 LAB — COMPREHENSIVE METABOLIC PANEL
ALT: 53 U/L — ABNORMAL HIGH (ref 0–44)
ANION GAP: 11 (ref 5–15)
AST: 29 U/L (ref 15–41)
Albumin: 3.2 g/dL — ABNORMAL LOW (ref 3.5–5.0)
Alkaline Phosphatase: 66 U/L (ref 38–126)
BILIRUBIN TOTAL: 0.6 mg/dL (ref 0.3–1.2)
BUN: 10 mg/dL (ref 6–20)
CALCIUM: 9.1 mg/dL (ref 8.9–10.3)
CO2: 22 mmol/L (ref 22–32)
Chloride: 107 mmol/L (ref 98–111)
Creatinine, Ser: 0.87 mg/dL (ref 0.61–1.24)
Glucose, Bld: 93 mg/dL (ref 70–99)
POTASSIUM: 3.4 mmol/L — AB (ref 3.5–5.1)
Sodium: 140 mmol/L (ref 135–145)
TOTAL PROTEIN: 7.4 g/dL (ref 6.5–8.1)

## 2017-11-07 LAB — CBC
HEMATOCRIT: 43.4 % (ref 39.0–52.0)
Hemoglobin: 13.9 g/dL (ref 13.0–17.0)
MCH: 24.6 pg — AB (ref 26.0–34.0)
MCHC: 32 g/dL (ref 30.0–36.0)
MCV: 76.8 fL — AB (ref 78.0–100.0)
Platelets: 337 10*3/uL (ref 150–400)
RBC: 5.65 MIL/uL (ref 4.22–5.81)
RDW: 13.2 % (ref 11.5–15.5)
WBC: 10 10*3/uL (ref 4.0–10.5)

## 2017-11-07 LAB — CSF CULTURE W GRAM STAIN: Gram Stain: NONE SEEN

## 2017-11-07 LAB — CSF CULTURE: CULTURE: NO GROWTH

## 2017-11-07 MED ORDER — GUAIFENESIN ER 600 MG PO TB12: 600.0000 mg | ORAL_TABLET | Freq: Two times a day (BID) | ORAL | 0 refills | Status: DC | PRN

## 2017-11-07 MED ORDER — DOXYCYCLINE HYCLATE 100 MG PO TABS: 100.0000 mg | ORAL_TABLET | Freq: Two times a day (BID) | ORAL | 0 refills | Status: AC

## 2017-11-07 MED ORDER — NICOTINE 21 MG/24HR TD PT24: 21.0000 mg | MEDICATED_PATCH | Freq: Every day | TRANSDERMAL | 0 refills | Status: DC

## 2017-11-07 NOTE — Discharge Summary (Signed)
[facility]  Physician Discharge Summary   Patient ID: Alex Potter MRN: [id number] DOB/AGE: [birth year] 22 y.o.  Admit date: 11/03/2017 Discharge date: 11/07/2017  PCP: Patient, No Pcp Per  DISCHARGE DIAGNOSES:  Drug overdose, unintentional  RECOMMENDATIONS FOR OUTPATIENT FOLLOW UP: 1. Patient provided outpatient resources for drug rehab facilities   DISCHARGE CONDITION: fair  Diet recommendation: As before  Filed Weights   11/03/17 1633 11/04/17 0500 11/05/17 0500  Weight: 84.7 kg (186 lb 11.7 oz) 84.7 kg (186 lb 11.7 oz) 82.4 kg (181 lb 10.5 oz)    INITIAL HISTORY: 22 year old Caucasian male with a past medical history significant for polysubstance abuse who spent a lot of time in rehab facilities who presented with altered mental status.  There was concern for drug overdose.  CT scan raised concern for hypoxic ischemic injury and cerebral edema.  Seen by neurology.  MRI was recommended.  For this patient had to be intubated.  He was transferred to the ICU service.  And then transferred back to [facility].  Consultations:  Neurology  Psychiatry  Procedures: Patient was intubated for airway protection   HOSPITAL COURSE:   Acute metabolic encephalopathy Patient's encephalopathy thought to be secondary to drug overdose. Patient admitted to taking heroin which likely had fentanyl as urine drug screen was negative for opioids. Patient was seen by neurology.  Patient underwent lumbar puncture.  CSF was unremarkable for infection.  Mental status is now back to baseline.  Does not need any further neurological work-up.    Hematochezia Patient mentioned passing blood in his stool.  His Hemoccult was however negative.  Hemoglobin remained stable.  His last BM was without any blood per patient.  Reason for his presentation is unclear.  Could have internal hemorrhoids.  No external hemorrhoids noted on examination.  No need to continue PPI.  Outpatient evaluation if he has  recurrence.    History of polysubstance abuse Urine drug screen was positive for THC.  There was some concern for synthetic THC use causing serotonin syndrome.  However this remains unclear.  Patient apparently involuntarily committed.    Seen by psychiatry.  Cleared by them.  Outpatient resources provided by Child psychotherapistsocial worker.  Patient not interested in going to [place] where his family will be able to get him more resources.  Elevated blood pressure Blood pressure is now normal.  Likely elevated due to acute illness.  No further work-up at this time.  Acute respiratory failure with hypoxia with concern for aspiration Imaging studies done initially did not show any pneumonia.  The last chest x-ray does raise concern for opacity in the right lung.  Tracheal aspirate positive for MRSA.  Patient denies any respiratory symptoms.    Treat with doxycycline for 5 more days.  Abnormal LFTs Possibly due to acute illness and alcohol.  Much improved.  Overall stable.  Okay for discharge today.     PERTINENT LABS:  The results of significant diagnostics from this hospitalization (including imaging, microbiology, ancillary and laboratory) are listed below for reference.    Microbiology: Recent Results (from the past 240 hour(s))  Blood Culture (routine x 2)     Status: None (Preliminary result)   Collection Time: 11/03/17 10:20 AM  Result Value Ref Range Status   Specimen Description   Final    RIGHT ANTECUBITAL Performed at [facility], [address] [place], [place] [id number]    Special Requests   Final    BOTTLES DRAWN AEROBIC AND ANAEROBIC Blood Culture  results may not be optimal due to an excessive volume of blood received in culture bottles Performed at [facility], [address] [place], [place] [address]    Culture   Final    NO GROWTH 4 DAYS Performed at [facility], [address] [place], [place] [address]    Report  Status PENDING  Incomplete  Blood Culture (routine x 2)     Status: None (Preliminary result)   Collection Time: 11/03/17 10:20 AM  Result Value Ref Range Status   Specimen Description   Final    BLOOD LEFT HAND Performed at [facility], [address], [place], [place] [address]    Special Requests   Final    BOTTLES DRAWN AEROBIC AND ANAEROBIC Blood Culture adequate volume Performed at [facility], [address], [place], [place] [address]    Culture   Final    NO GROWTH 4 DAYS Performed at [facility], [address] [place], [place] [address]    Report Status PENDING  Incomplete  Culture, respiratory (NON-Expectorated)     Status: None   Collection Time: 11/03/17 10:33 PM  Result Value Ref Range Status   Specimen Description TRACHEAL ASPIRATE  Final   Special Requests NONE  Final   Gram Stain   Final    NO WBC SEEN NO ORGANISMS SEEN Performed at [facility], [address] [place], [place] [address]    Culture FEW METHICILLIN RESISTANT STAPHYLOCOCCUS AUREUS  Final   Report Status 11/06/2017 FINAL  Final   Organism ID, Bacteria METHICILLIN RESISTANT STAPHYLOCOCCUS AUREUS  Final      Susceptibility   Methicillin resistant staphylococcus aureus - MIC*    CIPROFLOXACIN <=0.5 SENSITIVE Sensitive     ERYTHROMYCIN <=0.25 SENSITIVE Sensitive     GENTAMICIN <=0.5 SENSITIVE Sensitive     OXACILLIN >=4 RESISTANT Resistant     TETRACYCLINE <=1 SENSITIVE Sensitive     VANCOMYCIN 2 SENSITIVE Sensitive     TRIMETH/SULFA <=10 SENSITIVE Sensitive     CLINDAMYCIN <=0.25 SENSITIVE Sensitive     RIFAMPIN <=0.5 SENSITIVE Sensitive     Inducible Clindamycin NEGATIVE Sensitive     * FEW METHICILLIN RESISTANT STAPHYLOCOCCUS AUREUS  MRSA PCR Screening     Status: Abnormal   Collection Time: 11/03/17 10:36 PM  Result Value Ref Range Status   MRSA by PCR POSITIVE (A) NEGATIVE Final    Comment: RESULT CALLED TO, READ BACK BY AND VERIFIED  WITH: DICKSON,G RN [phone] 11/04/17 MITCHELL,L        The GeneXpert MRSA Assay (FDA approved for NASAL specimens only), is one component of a comprehensive MRSA colonization surveillance program. It is not intended to diagnose MRSA infection nor to guide or monitor treatment for MRSA infections.   Respiratory Panel by PCR     Status: None   Collection Time: 11/04/17 12:40 AM  Result Value Ref Range Status   Adenovirus NOT DETECTED NOT DETECTED Final   Coronavirus 229E NOT DETECTED NOT DETECTED Final   Coronavirus HKU1 NOT DETECTED NOT DETECTED Final   Coronavirus NL63 NOT DETECTED NOT DETECTED Final   Coronavirus OC43 NOT DETECTED NOT DETECTED Final   Metapneumovirus NOT DETECTED NOT DETECTED Final   Rhinovirus / Enterovirus NOT DETECTED NOT DETECTED Final   Influenza A NOT DETECTED NOT DETECTED Final   Influenza B NOT DETECTED NOT DETECTED Final   Parainfluenza Virus 1 NOT DETECTED NOT DETECTED Final   Parainfluenza Virus  2 NOT DETECTED NOT DETECTED Final   Parainfluenza Virus 3 NOT DETECTED NOT DETECTED Final   Parainfluenza Virus 4 NOT DETECTED NOT DETECTED Final   Respiratory Syncytial Virus NOT DETECTED NOT DETECTED Final   Bordetella pertussis NOT DETECTED NOT DETECTED Final   Chlamydophila pneumoniae NOT DETECTED NOT DETECTED Final   Mycoplasma pneumoniae NOT DETECTED NOT DETECTED Final    Comment: Performed at [facility], [address], [place], [place] [address]  CSF culture with Stat gram stain     Status: None (Preliminary result)   Collection Time: 11/04/17 12:59 PM  Result Value Ref Range Status   Specimen Description CSF  Final   Special Requests NONE  Final   Gram Stain NO WBC SEEN NO ORGANISMS SEEN   Final   Culture   Final    NO GROWTH 3 DAYS Performed at [facility], [address], [place], [place] [address]    Report Status PENDING  Incomplete     Labs: Basic Metabolic Panel: Recent Labs  Lab 11/03/17 2042 11/04/17 0058  11/05/17 0421 11/06/17 0624 11/07/17 0450  NA 144 141 140 141 140  K 4.0 3.6 3.2* 3.4* 3.4*  CL 112* 109 103 106 107  CO2 24 26 24 24 22   GLUCOSE 92 97 83 89 93  BUN 11 9 6 8 10   CREATININE 0.91 0.86 0.94 0.87 0.87  CALCIUM 8.2* 8.4* 8.5* 9.0 9.1  MG 1.7 1.8  --  2.1  --   PHOS 3.2 2.6  --  3.8  --    Liver Function Tests: Recent Labs  Lab 11/03/17 0925 11/03/17 2042 11/07/17 0450  AST 117* 82* 29  ALT 139* 104* 53*  ALKPHOS 77 50 66  BILITOT 0.4 0.7 0.6  PROT 7.8 5.9* 7.4  ALBUMIN 4.0 2.9* 3.2*    Recent Labs  Lab 11/03/17 1618  AMMONIA 38*   CBC: Recent Labs  Lab 11/03/17 0925  11/03/17 2042 11/04/17 0058 11/05/17 0421 11/06/17 0624 11/07/17 0450  WBC 19.5*  --  19.0* 14.4* 14.4* 10.2 10.0  NEUTROABS 16.1*  --  14.3*  --   --  5.6  --   HGB 14.5   < > 12.5* 11.4* 11.5* 12.6* 13.9  HCT 45.3   < > 38.4* 36.3* 35.7* 39.2 43.4  MCV 81.6  --  79.3 80.5 78.6 77.9* 76.8*  PLT 308  --  218 197 199 244 337   < > = values in this interval not displayed.    CBG: Recent Labs  Lab 11/03/17 1818  GLUCAP 113*     IMAGING STUDIES Ct Head Wo Contrast  Result Date: 11/03/2017 CLINICAL DATA:  Found in car unresponsive. EXAM: CT HEAD WITHOUT CONTRAST TECHNIQUE: Contiguous axial images were obtained from the base of the skull through the vertex without intravenous contrast. COMPARISON:  None. FINDINGS: Brain: Complete effacement of the cerebral sulci may be related to the patient's young age and robust baseline brain volume as gray-white differentiation appears preserved throughout and there is no frank herniation to suggest the presence of diffuse cerebral edema. There is no evidence of acute infarct, intracranial hemorrhage, mass, or extra-axial fluid collection. Vascular: No hyperdense vessel. Skull: No fracture or focal osseous lesion. Sinuses/Orbits: Visualized paranasal sinuses and mastoid air cells are clear. Orbits are unremarkable. Other: None. IMPRESSION: 1. No  definite acute intracranial abnormality identified. 2. Paucity of CSF without additional findings of diffuse hypoxic-ischemic injury/cerebral edema. These results were called by telephone  at the time of interpretation on 11/03/2017 at 11:15 am to Dr. Bethann Berkshire , who verbally acknowledged these results. Electronically Signed   By: Sebastian Ache M.D.   On: 11/03/2017 11:39   Mr Brain Wo Contrast  Result Date: 11/04/2017 CLINICAL DATA:  Initial evaluation for acute altered mental status, concern for possible cerebral anoxia. EXAM: MRI HEAD WITHOUT CONTRAST MRV HEAD WITHOUT CONTRAST TECHNIQUE: Multiplanar, multiecho pulse sequences of the brain and surrounding structures were obtained without intravenous contrast. Angiographic images of the intracranial venous structures were obtained using MRV technique without intravenous contrast. COMPARISON:  Prior CT from earlier the same day. FINDINGS: MRI HEAD FINDINGS Cerebral volume within normal limits for age. No focal parenchymal signal abnormality identified. Gray-white matter differentiation maintained. No significant gyral swelling, edema, or diffusion abnormality to suggest global cerebral anoxia. No evidence for acute or subacute infarct. No foci of susceptibility artifact to suggest acute or chronic intracranial hemorrhage. Subtly increased FLAIR signal seen within the cortical sulci of the bilateral parieto-occipital regions, suspected to be most likely related to FIO2 administration. No mass lesion, midline shift or mass effect. No hydrocephalus. No extra-axial fluid collection. Pituitary gland within normal limits. Suprasellar region normal. Midline structures intact and normal. Major intracranial vascular flow voids are maintained. Craniocervical junction within normal limits. Upper cervical spine normal. Bone marrow signal intensity normal. No scalp soft tissue abnormality. Globes and orbital soft tissues within normal limits. Mild scattered mucosal  thickening throughout the paranasal sinuses. No significant mastoid effusion. Inner ear structures grossly normal. MRV FINDINGS Normal flow related signal seen within the superior sagittal sinus to the level of the torcula. Transverse and sigmoid sinuses are patent as are the proximal internal jugular veins. Right transverse sinus dominant. Straight sinus, vein of Galen, and internal cerebral veins are patent as are the basal veins of Rosenthal. No appreciable dural sinus stenosis. IMPRESSION: MRI HEAD IMPRESSION 1. Subtly increased FLAIR signal seen within multiple cortical sulci of the bilateral parieto-occipital regions, nonspecific, but favored to be related to FiO2 administration in this intubated patient. If there is clinical concern for possible acute meningitis, then correlation with LP and CSF analysis is recommended. 2. Otherwise normal brain MRI. No evidence for cerebral anoxia or other abnormality. MRV HEAD IMPRESSION Normal intracranial MRV.  No evidence for dural sinus thrombosis. Electronically Signed   By: Rise Mu M.D.   On: 11/04/2017 01:10   Dg Chest Port 1 View  Result Date: 11/04/2017 CLINICAL DATA:  Patient on ventilator.  Evaluate ETT. EXAM: PORTABLE CHEST 1 VIEW COMPARISON:  November 03, 2017 FINDINGS: The ETT terminates 2.7 cm above the carina. The OG tube terminates below today's film. No pneumothorax. No nodules or masses. Mild opacity in the right base is stable. No other interval changes. IMPRESSION: 1. Support apparatus as above. 2. Mild opacity in the right base. Recommend attention on follow-up. Electronically Signed   By: Gerome Sam III M.D   On: 11/04/2017 07:59   Dg Chest Port 1 View  Result Date: 11/03/2017 CLINICAL DATA:  22 year old male status post intubation. EXAM: PORTABLE CHEST 1 VIEW COMPARISON:  Chest radiograph dated 11/03/2017 FINDINGS: There has been interval placement of an endotracheal tube with tip approximately 3 cm above the carina. An enteric  tube extends below the diaphragm to the right side of the spine with tip beyond the inferior margin of the image. There are bilateral interstitial streaky densities. Bibasilar densities, right greater than left may represent atelectatic changes or infiltrate. Trace right pleural  effusion may be present. There is no pneumothorax. Stable cardiac silhouette. No acute osseous pathology. IMPRESSION: 1. Interval placement of endotracheal and enteric tubes. 2. Mild interstitial and bibasilar streaky densities, possibly atelectatic changes. Clinical correlation is recommended. Electronically Signed   By: Elgie Collard M.D.   On: 11/03/2017 21:26   Dg Chest Port 1 View  Result Date: 11/03/2017 CLINICAL DATA:  Patient found unresponsive. EXAM: PORTABLE CHEST 1 VIEW COMPARISON:  None. FINDINGS: Lungs are adequately inflated and otherwise clear. Cardiomediastinal silhouette, bones and soft tissues are normal. IMPRESSION: No active disease. Electronically Signed   By: Elberta Fortis M.D.   On: 11/03/2017 10:42   Mr Mrv Head Wo Cm  Result Date: 11/04/2017 CLINICAL DATA:  Initial evaluation for acute altered mental status, concern for possible cerebral anoxia. EXAM: MRI HEAD WITHOUT CONTRAST MRV HEAD WITHOUT CONTRAST TECHNIQUE: Multiplanar, multiecho pulse sequences of the brain and surrounding structures were obtained without intravenous contrast. Angiographic images of the intracranial venous structures were obtained using MRV technique without intravenous contrast. COMPARISON:  Prior CT from earlier the same day. FINDINGS: MRI HEAD FINDINGS Cerebral volume within normal limits for age. No focal parenchymal signal abnormality identified. Gray-white matter differentiation maintained. No significant gyral swelling, edema, or diffusion abnormality to suggest global cerebral anoxia. No evidence for acute or subacute infarct. No foci of susceptibility artifact to suggest acute or chronic intracranial hemorrhage. Subtly  increased FLAIR signal seen within the cortical sulci of the bilateral parieto-occipital regions, suspected to be most likely related to FIO2 administration. No mass lesion, midline shift or mass effect. No hydrocephalus. No extra-axial fluid collection. Pituitary gland within normal limits. Suprasellar region normal. Midline structures intact and normal. Major intracranial vascular flow voids are maintained. Craniocervical junction within normal limits. Upper cervical spine normal. Bone marrow signal intensity normal. No scalp soft tissue abnormality. Globes and orbital soft tissues within normal limits. Mild scattered mucosal thickening throughout the paranasal sinuses. No significant mastoid effusion. Inner ear structures grossly normal. MRV FINDINGS Normal flow related signal seen within the superior sagittal sinus to the level of the torcula. Transverse and sigmoid sinuses are patent as are the proximal internal jugular veins. Right transverse sinus dominant. Straight sinus, vein of Galen, and internal cerebral veins are patent as are the basal veins of Rosenthal. No appreciable dural sinus stenosis. IMPRESSION: MRI HEAD IMPRESSION 1. Subtly increased FLAIR signal seen within multiple cortical sulci of the bilateral parieto-occipital regions, nonspecific, but favored to be related to FiO2 administration in this intubated patient. If there is clinical concern for possible acute meningitis, then correlation with LP and CSF analysis is recommended. 2. Otherwise normal brain MRI. No evidence for cerebral anoxia or other abnormality. MRV HEAD IMPRESSION Normal intracranial MRV.  No evidence for dural sinus thrombosis. Electronically Signed   By: Rise Mu M.D.   On: 11/04/2017 01:10    DISCHARGE EXAMINATION: Vitals:   11/06/17 0600 11/06/17 1559 11/06/17 2122 11/07/17 0613  BP: 115/70 137/85 (!) 126/97 124/87  Pulse: (!) 58 71 86 86  Resp: 18 18 17 17   Temp: 98.3 F (36.8 C) 99 F (37.2 C) 99.7  F (37.6 C) 98.4 F (36.9 C)  TempSrc: Oral Oral Oral Oral  SpO2: 97% 95% 97% 96%  Weight:      Height:       General appearance: alert, cooperative, appears stated age and no distress Resp: clear to auscultation bilaterally Cardio: regular rate and rhythm, S1, S2 normal, no murmur, click, rub or gallop GI:  soft, non-tender; bowel sounds normal; no masses,  no organomegaly  DISPOSITION: Home/Self care  Discharge Instructions    Call MD for:  difficulty breathing, headache or visual disturbances   Complete by:  As directed    Call MD for:  extreme fatigue   Complete by:  As directed    Call MD for:  hives   Complete by:  As directed    Call MD for:  persistant dizziness or light-headedness   Complete by:  As directed    Call MD for:  persistant nausea and vomiting   Complete by:  As directed    Call MD for:  severe uncontrolled pain   Complete by:  As directed    Call MD for:  temperature >100.4   Complete by:  As directed    Discharge instructions   Complete by:  As directed    Please seek attention at an outpatient facility to help with your drug use.  You were cared for by a hospitalist during your hospital stay. If you have any questions about your discharge medications or the care you received while you were in the hospital after you are discharged, you can call the unit and asked to speak with the hospitalist on call if the hospitalist that took care of you is not available. Once you are discharged, your primary care physician will handle any further medical issues. Please note that NO REFILLS for any discharge medications will be authorized once you are discharged, as it is imperative that you return to your primary care physician (or establish a relationship with a primary care physician if you do not have one) for your aftercare needs so that they can reassess your need for medications and monitor your lab values. If you do not have a primary care physician, you can call  [phone] for a physician referral.   Increase activity slowly   Complete by:  As directed         Allergies as of 11/07/2017      Reactions   Mushroom Extract Complex Itching, Swelling      Medication List    STOP taking these medications   benzonatate 100 MG capsule Commonly known as:  TESSALON   DULoxetine 30 MG capsule Commonly known as:  CYMBALTA   methocarbamol 500 MG tablet Commonly known as:  ROBAXIN     TAKE these medications   alum & mag hydroxide-simeth 200-200-20 MG/5ML suspension Commonly known as:  MAALOX/MYLANTA Take 15-30 mLs by mouth as needed for indigestion or heartburn.   Benzocaine 15 MG Lozg Use as directed 1 each in the mouth or throat every 4 (four) hours as needed (for sore throat).   cetirizine 10 MG tablet Commonly known as:  ZYRTEC Take 10 mg by mouth daily as needed for allergies.   doxycycline 100 MG tablet Commonly known as:  VIBRA-TABS Take 1 tablet (100 mg total) by mouth every 12 (twelve) hours for 5 days.   guaiFENesin 600 MG 12 hr tablet Commonly known as:  MUCINEX Take 1 tablet (600 mg total) by mouth 2 (two) times daily as needed for cough or to loosen phlegm.   ibuprofen 600 MG tablet Commonly known as:  ADVIL,MOTRIN Take 600 mg by mouth every 6 (six) hours as needed for fever or mild pain.   loperamide 2 MG tablet Commonly known as:  IMODIUM A-D Take 2 mg by mouth 4 (four) times daily as needed for diarrhea or loose stools.   Melatonin 5 MG  Tabs Take 5-10 mg by mouth at bedtime as needed (for insomnia).   multivitamin with minerals Tabs tablet Take 1 tablet by mouth daily.   NAUSEA CONTROL 1.87-1.87-21.5 Soln Take 5-10 mLs by mouth every 15 (fifteen) minutes as needed (for nausea).   nicotine 21 mg/24hr patch Commonly known as:  NICODERM CQ - dosed in mg/24 hours Place 1 patch (21 mg total) onto the skin daily.   ondansetron 8 MG tablet Commonly known as:  ZOFRAN Take 8 mg by mouth 2 (two) times daily as needed  for nausea or vomiting.          TOTAL DISCHARGE TIME: 35 minutes  Osvaldo Shipper  [facility] Pager [phone]  11/07/2017, 1:41 PM

## 2017-11-07 NOTE — Discharge Instructions (Signed)
Aspiration Pneumonia °Aspiration pneumonia is an infection in your lungs. It occurs when food, liquid, or stomach contents (vomit) are inhaled (aspirated) into your lungs. When these things get into your lungs, swelling (inflammation) and infection can occur. This can make it difficult for you to breathe. Aspiration pneumonia is a serious condition and can be life threatening. °What increases the risk? °Aspiration pneumonia is more likely to occur when a person's cough (gag) reflex or ability to swallow has been decreased. Some things that can do this include: °· Having a brain injury or disease, such as stroke, seizures, Parkinson's disease, dementia, or amyotrophic lateral sclerosis (ALS). °· Being given general anesthetic for procedures. °· Being in a coma (unconscious). °· Having a narrowing of the tube that carries food to the stomach (esophagus). °· Drinking too much alcohol. If a person passes out and vomits, vomit can be swallowed into the lungs. °· Taking certain medicines, such as tranquilizers or sedatives. ° °What are the signs or symptoms? °· Coughing after swallowing food or liquids. °· Breathing problems, such as wheezing or shortness of breath. °· Bluish skin. This can be caused by lack of oxygen. °· Coughing up food or mucus. The mucus might contain blood, greenish material, or yellowish-white fluid (pus). °· Fever. °· Chest pain. °· Being more tired than usual (fatigue). °· Sweating more than usual. °· Bad breath. °How is this diagnosed? °A physical exam will be done. During the exam, the health care provider will listen to your lungs with a stethoscope to check for: °· Crackling sounds in the lungs. °· Decreased breath sounds. °· A rapid heartbeat. ° °Various tests may be ordered. These may include: °· Chest X-ray. °· CT scan. °· Swallowing study. This test looks at how food is swallowed and whether it goes into your breathing tube (trachea) or food pipe (esophagus). °· Sputum culture. Saliva and  mucus (sputum) are collected from the lungs or the tubes that carry air to the lungs (bronchi). The sputum is then tested for bacteria. °· Bronchoscopy. This test uses a flexible tube (bronchoscope) to see inside the lungs. ° °How is this treated? °Treatment will usually include antibiotic medicines. Other medicines may also be used to reduce fever or pain. You may need to be treated in the hospital. In the hospital, your breathing will be carefully monitored. Depending on how well you are breathing, you may need to be given oxygen, or you may need breathing support from a breathing machine (ventilator). For people who fail a swallowing study, a feeding tube might be placed in the stomach, or they may be asked to avoid certain food textures or liquids when they eat. °Follow these instructions at home: °· Carefully follow any special eating instructions you were given, such as avoiding certain food textures or thickening liquids. This reduces the risk of developing aspiration pneumonia again. °· Only take over-the-counter or prescription medicines as directed by your health care provider. Follow the directions carefully. °· If you were prescribed antibiotics, take them as directed. Finish them even if you start to feel better. °· Rest as instructed by your health care provider. °· Keep all follow-up appointments with your health care provider. °Contact a health care provider if: °· You develop worsening shortness of breath, wheezing, or difficulty breathing. °· You develop a fever. °· You have chest pain. °This information is not intended to replace advice given to you by your health care provider. Make sure you discuss any questions you have with your health care   provider. °Document Released: 02/25/2009 Document Revised: 10/12/2015 Document Reviewed: 10/16/2012 °Elsevier Interactive Patient Education © 2017 [employer] ° °

## 2017-11-07 NOTE — Progress Notes (Signed)
CSW provided bus pass.   CSW signing off.  Osborne Cascoadia Julien Oscar LCSW [phone]

## 2017-11-07 NOTE — Progress Notes (Signed)
Reviewed d/c instructions with patient.  Medications reviewed.  Discussed with patient reason for admission and criteria met for discharge.  Pt verbalizes understanding and requests transportation.  Case management contacted.  IV and telemetry d/c'd

## 2017-11-08 LAB — MISC LABCORP TEST (SEND OUT): LABCORP TEST CODE: 701106

## 2017-11-08 LAB — CULTURE, BLOOD (ROUTINE X 2)
CULTURE: NO GROWTH
Culture: NO GROWTH
Special Requests: ADEQUATE

## 2017-11-12 LAB — MISC LABCORP TEST (SEND OUT): LABCORP TEST NAME: 803301

## 2018-11-28 ENCOUNTER — Encounter (HOSPITAL_COMMUNITY): Payer: Self-pay | Admitting: Emergency Medicine

## 2018-11-28 ENCOUNTER — Emergency Department (HOSPITAL_COMMUNITY)
Admission: EM | Admit: 2018-11-28 | Discharge: 2018-11-28 | Disposition: A | Discharge status: Home / Self Care | Payer: BLUE CROSS/BLUE SHIELD | Attending: Emergency Medicine | Admitting: Emergency Medicine

## 2018-11-28 ENCOUNTER — Other Ambulatory Visit: Payer: Self-pay

## 2018-11-28 DIAGNOSIS — F17200 Nicotine dependence, unspecified, uncomplicated: Secondary | ICD-10-CM | POA: Insufficient documentation

## 2018-11-28 DIAGNOSIS — R0602 Shortness of breath: Secondary | ICD-10-CM | POA: Diagnosis present

## 2018-11-28 DIAGNOSIS — F410 Panic disorder [episodic paroxysmal anxiety] without agoraphobia: Secondary | ICD-10-CM

## 2018-11-28 NOTE — ED Provider Notes (Signed)
[facility] DEPT Provider Note   CSN: [id number] Arrival date & time: 11/28/18  0415    History   Chief Complaint Chief Complaint  Patient presents with  . Shortness of Breath  . Anxiety    HPI Alex Potter is a 23 y.o. male.     HPI   He presents for evaluation of "problems with my lungs."  He states that he was lying down to sleep last night, and became concerned that he would stop breathing, therefore came to the emergency department.  Earlier the patient presented by EMS and told his caregivers that that time that he and his girlfriend had been arguing about his smoking.  Patient became anxious and short of breath, and was transferred here without specific treatment.  Patient denies fever, chills, nausea, vomiting, weakness or dizziness.  There are no other known modifying factors.  Past Medical History:  Diagnosis Date  . Drug abuse ([place])   . Tobacco abuse     Patient Active Problem List   Diagnosis Date Noted  . Heroin overdose, accidental or unintentional, initial encounter ([place])   . Acute encephalopathy   . Sepsis ([place]) 11/03/2017  . Acute metabolic encephalopathy 85/46/2703  . Acute respiratory failure with hypoxia ([place]) 11/03/2017  . Aspiration into airway   . Lactic acidosis   . Choledocholithiasis   . Abnormal liver function 07/04/2017  . Depression 07/04/2017  . Acute cholecystitis 07/03/2017  . Gallstone 07/03/2017  . Right upper quadrant abdominal pain 07/03/2017  . Cholecystitis 07/03/2017  . Tobacco abuse   . Drug abuse [facility])     Past Surgical History:  Procedure Laterality Date  . CHOLECYSTECTOMY N/A 07/05/2017   Procedure: LAPAROSCOPIC CHOLECYSTECTOMY WITH INTRAOPERATIVE CHOLANGIOGRAM;  Surgeon: Judeth Horn, MD;  Location: [facility];  Service: General;  Laterality: N/A;  . ERCP N/A 07/04/2017   Procedure: ENDOSCOPIC RETROGRADE CHOLANGIOPANCREATOGRAPHY (ERCP);  Surgeon: Doran Stabler, MD;  Location: [facility];   Service: Gastroenterology;  Laterality: N/A;  . INGUINAL HERNIA REPAIR Right         Home Medications    Prior to Admission medications   Medication Sig Start Date End Date Taking? Authorizing Provider  ibuprofen (ADVIL,MOTRIN) 600 MG tablet Take 600 mg by mouth every 6 (six) hours as needed for fever or mild pain.   Yes [provider]  guaiFENesin (MUCINEX) 600 MG 12 hr tablet Take 1 tablet (600 mg total) by mouth 2 (two) times daily as needed for cough or to loosen phlegm. Patient not taking: Reported on 11/28/2018 11/07/17   Bonnielee Haff, MD  nicotine (NICODERM CQ - DOSED IN MG/24 HOURS) 21 mg/24hr patch Place 1 patch (21 mg total) onto the skin daily. Patient not taking: Reported on 11/28/2018 11/07/17   Bonnielee Haff, MD    Family History History reviewed. No pertinent family history.  Social History Social History   Tobacco Use  . Smoking status: Current Every Day Smoker  . Smokeless tobacco: Never Used  Substance Use Topics  . Alcohol use: Yes  . Drug use: Yes    Types: Marijuana, Methamphetamines     Allergies   Mushroom extract complex   Review of Systems Review of Systems  All other systems reviewed and are negative.    Physical Exam Updated Vital Signs BP 122/66 (BP Location: Right Arm)   Pulse 68   Temp 98.1 F (36.7 C) (Oral)   Resp 17   Ht 5\' 4"  (1.626 m)   Wt 74.4 kg  SpO2 96%   BMI 28.15 kg/m   Physical Exam Vitals signs and nursing note reviewed.  Constitutional:      General: He is not in acute distress.    Appearance: He is well-developed. He is not ill-appearing, toxic-appearing or diaphoretic.  HENT:     Head: Normocephalic and atraumatic.     Right Ear: External ear normal.     Left Ear: External ear normal.  Eyes:     Conjunctiva/sclera: Conjunctivae normal.     Pupils: Pupils are equal, round, and reactive to light.  Neck:     Musculoskeletal: Normal range of motion and neck supple.     Trachea: Phonation  normal.  Cardiovascular:     Rate and Rhythm: Normal rate and regular rhythm.     Heart sounds: Normal heart sounds.  Pulmonary:     Effort: Pulmonary effort is normal.     Breath sounds: Normal breath sounds.  Abdominal:     General: There is no distension.     Palpations: Abdomen is soft.     Tenderness: There is no abdominal tenderness. There is no guarding.  Musculoskeletal: Normal range of motion.  Skin:    General: Skin is warm and dry.  Neurological:     Mental Status: He is alert and oriented to person, place, and time.     Cranial Nerves: No cranial nerve deficit.     Sensory: No sensory deficit.     Motor: No abnormal muscle tone.     Coordination: Coordination normal.  Psychiatric:        Mood and Affect: Mood normal.        Behavior: Behavior normal.        Thought Content: Thought content normal.        Judgment: Judgment normal.      ED Treatments / Results  Labs (all labs ordered are listed, but only abnormal results are displayed) Labs Reviewed - No data to display  EKG None  Radiology No results found.  Procedures Procedures (including critical care time)  Medications Ordered in ED Medications - No data to display   Initial Impression / Assessment and Plan / ED Course  I have reviewed the triage vital signs and the nursing notes.  Pertinent labs & imaging results that were available during my care of the patient were reviewed by me and considered in my medical decision making (see chart for details).         Patient Vitals for the past 24 hrs:  BP Temp Temp src Pulse Resp SpO2 Height Weight  11/28/18 0900 122/66 - - 68 17 96 % - -  11/28/18 0830 128/70 - - (!) 58 18 97 % - -  11/28/18 0800 110/65 - - 83 17 100 % - -  11/28/18 0759 - - - 83 - 99 % - -  11/28/18 0734 (!) 120/59 - - 87 17 98 % - -  11/28/18 0730 (!) 120/59 - - (!) 57 - 98 % - -  11/28/18 0443 - - - - - - 5\' 4"  (1.626 m) 74.4 kg  11/28/18 0442 - 98.1 F (36.7 C) Oral - -  99 % - -  11/28/18 0435 - - - - - 97 % - -  11/28/18 0432 114/61 (!) 97.4 F (36.3 C) Oral 74 16 97 % - -  11/28/18 0431 114/61 - - 65 - 99 % - -    9:09 AM Reevaluation with update and discussion. After initial  assessment and treatment, an updated evaluation reveals he is comfortable and expresses no further complaints or desires.  Findings discussed and questions answered. Mancel BaleElliott Yarnell Kozloski   Medical Decision Making: Nonspecific symptoms, with shortness of breath, and anxiousness, following argument with his significant other. The vital signs trended normal, without evidence for cardiac or respiratory compromise.  CRITICAL CARE-no Performed by: Mancel BaleElliott Ashaya Raftery  Nursing Notes Reviewed/ Care Coordinated Applicable Imaging Reviewed Interpretation of Laboratory Data incorporated into ED treatment  The patient appears reasonably screened and/or stabilized for discharge and I doubt any other medical condition or other [facility] requiring further screening, evaluation, or treatment in the ED at this time prior to discharge.  Plan: Home Medications-OTC if needed; Home Treatments-rest, fluids; return here if the recommended treatment, does not improve the symptoms; Recommended follow up-PCP or counselor of choice as needed   Final Clinical Impressions(s) / ED Diagnoses   Final diagnoses:  Panic attack    ED Discharge Orders    None       Mancel BaleWentz, [place] Haydon, MD 11/28/18 [phone]

## 2018-11-28 NOTE — ED Notes (Signed)
Wentz, MD at bedside.  

## 2018-11-28 NOTE — ED Notes (Addendum)
Pt said he would like, "Something to ease my muscles or something." Relayed message to provider.

## 2018-11-28 NOTE — Discharge Instructions (Addendum)
The symptoms that he had earlier may have been a panic attack.  Sometimes these arrive for reasons, other times without warning.  It is important to make sure that you are getting plenty of rest, drinking a lot of fluids, and work on stress management.  We are supplying some information that can help you find a counselor or treatment facility in case you need to for ongoing care.  You can always return here if needed, for problems.

## 2018-11-28 NOTE — ED Triage Notes (Signed)
[facility] transported pt from home to [facility] ED and reports the following:  Pt and his girlfriend were arguing about him quitting smoking. After the argument the pt has anxiety and SOB. He call 911. Lung sounds clear. Upon standing his BP was 102/68 and he felt dizzy. EMS gave 273mL Normal Saline.   18g L AC

## 2018-11-28 NOTE — ED Notes (Addendum)
Patient denies being around anyone with covid symptoms or being covid positive. Patient states he has never been diagnosed with asthma. Patient admits to smoking cigarettes Patient states, "It just feels like I have a lot of gunk in my lungs".

## 2018-12-03 ENCOUNTER — Emergency Department (HOSPITAL_COMMUNITY): Payer: BLUE CROSS/BLUE SHIELD

## 2018-12-03 ENCOUNTER — Encounter (HOSPITAL_COMMUNITY): Payer: Self-pay | Admitting: Emergency Medicine

## 2018-12-03 ENCOUNTER — Emergency Department (HOSPITAL_COMMUNITY)
Admission: EM | Admit: 2018-12-03 | Discharge: 2018-12-03 | Disposition: A | Discharge status: Home / Self Care | Payer: BLUE CROSS/BLUE SHIELD | Attending: Emergency Medicine | Admitting: Emergency Medicine

## 2018-12-03 DIAGNOSIS — F1290 Cannabis use, unspecified, uncomplicated: Secondary | ICD-10-CM | POA: Diagnosis not present

## 2018-12-03 DIAGNOSIS — T40601A Poisoning by unspecified narcotics, accidental (unintentional), initial encounter: Secondary | ICD-10-CM

## 2018-12-03 DIAGNOSIS — R1110 Vomiting, unspecified: Secondary | ICD-10-CM | POA: Insufficient documentation

## 2018-12-03 DIAGNOSIS — N289 Disorder of kidney and ureter, unspecified: Secondary | ICD-10-CM | POA: Diagnosis not present

## 2018-12-03 DIAGNOSIS — R7309 Other abnormal glucose: Secondary | ICD-10-CM | POA: Diagnosis not present

## 2018-12-03 DIAGNOSIS — F17210 Nicotine dependence, cigarettes, uncomplicated: Secondary | ICD-10-CM | POA: Diagnosis not present

## 2018-12-03 LAB — COMPREHENSIVE METABOLIC PANEL WITH GFR
ALT: 21 U/L (ref 0–44)
AST: 35 U/L (ref 15–41)
Albumin: 3.9 g/dL (ref 3.5–5.0)
Alkaline Phosphatase: 61 U/L (ref 38–126)
Anion gap: 16 — ABNORMAL HIGH (ref 5–15)
BUN: 10 mg/dL (ref 6–20)
CO2: 22 mmol/L (ref 22–32)
Calcium: 8.9 mg/dL (ref 8.9–10.3)
Chloride: 100 mmol/L (ref 98–111)
Creatinine, Ser: 1.32 mg/dL — ABNORMAL HIGH (ref 0.61–1.24)
GFR calc Af Amer: >60 mL/min (ref >60)
GFR calc non Af Amer: >60 mL/min (ref >60)
Glucose, Bld: 263 mg/dL — ABNORMAL HIGH (ref 70–99)
Potassium: 4.1 mmol/L (ref 3.5–5.1)
Sodium: 138 mmol/L (ref 135–145)
Total Bilirubin: 0.2 mg/dL — ABNORMAL LOW (ref 0.3–1.2)
Total Protein: 7.1 g/dL (ref 6.5–8.1)

## 2018-12-03 LAB — CBC WITH DIFFERENTIAL/PLATELET
Abs Immature Granulocytes: 0.04 10*3/uL (ref 0.00–0.07)
Basophils Absolute: 0 10*3/uL (ref 0.0–0.1)
Basophils Relative: 0 %
Eosinophils Absolute: 0 10*3/uL (ref 0.0–0.5)
Eosinophils Relative: 0 %
HCT: 43.6 % (ref 39.0–52.0)
Hemoglobin: 13.5 g/dL (ref 13.0–17.0)
Immature Granulocytes: 0 %
Lymphocytes Relative: 30 %
Lymphs Abs: 2.7 10*3/uL (ref 0.7–4.0)
MCH: 26 pg (ref 26.0–34.0)
MCHC: 31 g/dL (ref 30.0–36.0)
MCV: 83.8 fL (ref 80.0–100.0)
Monocytes Absolute: 0.5 10*3/uL (ref 0.1–1.0)
Monocytes Relative: 6 %
Neutro Abs: 5.7 10*3/uL (ref 1.7–7.7)
Neutrophils Relative %: 64 %
Platelets: 257 10*3/uL (ref 150–400)
RBC: 5.2 MIL/uL (ref 4.22–5.81)
RDW: 13.1 % (ref 11.5–15.5)
WBC: 9 10*3/uL (ref 4.0–10.5)
nRBC: 0 % (ref 0.0–0.2)

## 2018-12-03 LAB — ETHANOL: Alcohol, Ethyl (B): <10 mg/dL (ref <10)

## 2018-12-03 MED ORDER — SODIUM CHLORIDE 0.9 % IV BOLUS
1000.0000 mL | Freq: Once | INTRAVENOUS | Status: AC
  Administered 2018-12-03: 05:00:00 1000 mL via INTRAVENOUS

## 2018-12-03 MED ORDER — ONDANSETRON HCL 4 MG/2ML IJ SOLN
4.0000 mg | Freq: Once | INTRAMUSCULAR | Status: AC
  Administered 2018-12-03: 4 mg via INTRAVENOUS
  Filled 2018-12-03: qty 2

## 2018-12-03 NOTE — ED Notes (Signed)
Pt. Given food and soda per RN. 

## 2018-12-03 NOTE — ED Provider Notes (Signed)
[facility] EMERGENCY DEPARTMENT Provider Note   CSN: [id number] Arrival date & time: 12/03/18  0423    History   Chief Complaint Chief Complaint  Patient presents with  . Drug Overdose    HPI Alex Potter is a 23 y.o. male.   The history is provided by the patient and the EMS personnel.  Drug Overdose  He has history of drug abuse was brought in by EMS as a probable drug overdose.  EMS was called because patient was unresponsive.  They report that he was cyanotic and they had to assist respirations with Ambu bag for 10 minutes.  He also received naloxone 3 mg and then woke up.  He has been shivering since he woke up.  He did vomit once in the ambulance.  He states that he smoked some marijuana and snorted some cocaine but denies heroin use.  Past Medical History:  Diagnosis Date  . Drug abuse ([place])   . Tobacco abuse     Patient Active Problem List   Diagnosis Date Noted  . Heroin overdose, accidental or unintentional, initial encounter ([place])   . Acute encephalopathy   . Sepsis ([place]) 11/03/2017  . Acute metabolic encephalopathy 85/63/1497  . Acute respiratory failure with hypoxia ([place]) 11/03/2017  . Aspiration into airway   . Lactic acidosis   . Choledocholithiasis   . Abnormal liver function 07/04/2017  . Depression 07/04/2017  . Acute cholecystitis 07/03/2017  . Gallstone 07/03/2017  . Right upper quadrant abdominal pain 07/03/2017  . Cholecystitis 07/03/2017  . Tobacco abuse   . Drug abuse [facility])     Past Surgical History:  Procedure Laterality Date  . CHOLECYSTECTOMY N/A 07/05/2017   Procedure: LAPAROSCOPIC CHOLECYSTECTOMY WITH INTRAOPERATIVE CHOLANGIOGRAM;  Surgeon: Judeth Horn, MD;  Location: [facility];  Service: General;  Laterality: N/A;  . ERCP N/A 07/04/2017   Procedure: ENDOSCOPIC RETROGRADE CHOLANGIOPANCREATOGRAPHY (ERCP);  Surgeon: Doran Stabler, MD;  Location: [facility];  Service: Gastroenterology;  Laterality: N/A;  . INGUINAL  HERNIA REPAIR Right         Home Medications    Prior to Admission medications   Medication Sig Start Date End Date Taking? Authorizing Provider  guaiFENesin (MUCINEX) 600 MG 12 hr tablet Take 1 tablet (600 mg total) by mouth 2 (two) times daily as needed for cough or to loosen phlegm. Patient not taking: Reported on 11/28/2018 11/07/17   Bonnielee Haff, MD  ibuprofen (ADVIL,MOTRIN) 600 MG tablet Take 600 mg by mouth every 6 (six) hours as needed for fever or mild pain.    [provider]  nicotine (NICODERM CQ - DOSED IN MG/24 HOURS) 21 mg/24hr patch Place 1 patch (21 mg total) onto the skin daily. Patient not taking: Reported on 11/28/2018 11/07/17   Bonnielee Haff, MD    Family History No family history on file.  Social History Social History   Tobacco Use  . Smoking status: Current Every Day Smoker  . Smokeless tobacco: Never Used  Substance Use Topics  . Alcohol use: Yes  . Drug use: Yes    Types: Marijuana, Methamphetamines     Allergies   Mushroom extract complex   Review of Systems Review of Systems  Unable to perform ROS: Acuity of condition  All other systems reviewed and are negative.    Physical Exam Updated Vital Signs BP 111/69   Pulse 78   Temp 98.4 F (36.9 C) (Oral)   Resp 20   SpO2 100%   Physical Exam  Vitals signs and nursing note reviewed.    23 year old male, shivering, but in no acute distress. Vital signs are normal. Oxygen saturation is 100%, which is normal. Head is normocephalic and atraumatic. PERRLA, EOMI. Oropharynx is clear. Neck is nontender and supple without adenopathy or JVD. Back is nontender and there is no CVA tenderness. Lungs are clear without rales, wheezes, or rhonchi. Chest is nontender. Heart has regular rate and rhythm without murmur. Abdomen is soft, flat, nontender without masses or hepatosplenomegaly and peristalsis is normoactive. Extremities have no cyanosis or edema, full range of motion is  present. Skin is warm and dry without rash. Neurologic: Mental status is normal, cranial nerves are intact, there are no motor or sensory deficits.  ED Treatments / Results  Labs (all labs ordered are listed, but only abnormal results are displayed) Labs Reviewed  COMPREHENSIVE METABOLIC PANEL - Abnormal; Notable for the following components:      Result Value   Glucose, Bld 263 (*)    Creatinine, Ser 1.32 (*)    Total Bilirubin 0.2 (*)    Anion gap 16 (*)    All other components within normal limits  ETHANOL  CBC WITH DIFFERENTIAL/PLATELET  RAPID URINE DRUG SCREEN, HOSP PERFORMED    EKG None  Radiology No results found.  Procedures Procedures  Medications Ordered in ED Medications  ondansetron (ZOFRAN) injection 4 mg (has no administration in time range)  sodium chloride 0.9 % bolus 1,000 mL (has no administration in time range)     Initial Impression / Assessment and Plan / ED Course  I have reviewed the triage vital signs and the nursing notes.  Pertinent labs & imaging results that were available during my care of the patient were reviewed by me and considered in my medical decision making (see chart for details).  Probable narcotic overdose.  Old records are reviewed, and he has a prior hospitalization for presumed fentanyl overdose.  He is now awake and alert and with adequate oxygen saturation.  Given concern for possible aspiration in the field, will check chest x-ray.  He will need to be observed for several hours.  Labs are significant for slight elevation of creatinine over baseline (last value was 1 year ago), and elevated glucose.  He is currently awake and conversant and maintaining adequate oxygen saturation on room air.  Case is signed out to Dr. Madilyn Hookees.  Final Clinical Impressions(s) / ED Diagnoses   Final diagnoses:  Opiate overdose, accidental or unintentional, initial encounter [facility](HCC)  Renal insufficiency  Elevated glucose level    ED Discharge  Orders    None       Dione BoozeGlick, Courtenay Creger, MD 12/03/18 [phone]

## 2018-12-03 NOTE — ED Provider Notes (Signed)
Patient care assumed at oh 700. Patient here following narcotic overdose. Patient is somnolent but arouses on evaluation. Plan to continue to monitor. Patient without current complaints.  Patient observed for several hours in the emergency department without recurrent apnea. Discussed with patient findings of AKI, hyperglycemia. Discussed home care for substance abuse. Referred for outpatient follow-up. Return precautions discussed.   Quintella Reichert, MD 12/03/18 [phone]

## 2018-12-03 NOTE — ED Triage Notes (Signed)
Pt presents to ED from home BIB [facility]. Per EMS Pt's gf started CPR after she and pt smoke marijuana. Pt became unresponsive and apneic. Upon their arrival was being bagged. Pt became responsive with 3mg  narcan. EMS reports emesis x1 during transport possible aspiration.

## 2018-12-03 NOTE — ED Notes (Signed)
Pt asleep but easily arousable. Pt not alert enough to ambulate.

## 2018-12-03 NOTE — ED Notes (Signed)
Pt ambulated without assistance. Pt denied SOB or lightheadedness. Pt reported being slightly off balance after ambulating. Pt was more alert and oriented and appeared to be waking up.

## 2018-12-03 NOTE — ED Notes (Signed)
Pt unable to urinate at this time.  

## 2019-05-31 ENCOUNTER — Other Ambulatory Visit: Payer: Self-pay

## 2019-05-31 DIAGNOSIS — R05 Cough: Secondary | ICD-10-CM | POA: Diagnosis not present

## 2019-05-31 DIAGNOSIS — Z79899 Other long term (current) drug therapy: Secondary | ICD-10-CM | POA: Insufficient documentation

## 2019-05-31 DIAGNOSIS — Z20822 Contact with and (suspected) exposure to covid-19: Secondary | ICD-10-CM | POA: Diagnosis not present

## 2019-05-31 DIAGNOSIS — R0602 Shortness of breath: Secondary | ICD-10-CM | POA: Diagnosis not present

## 2019-05-31 DIAGNOSIS — F17200 Nicotine dependence, unspecified, uncomplicated: Secondary | ICD-10-CM | POA: Insufficient documentation

## 2019-05-31 DIAGNOSIS — R6883 Chills (without fever): Secondary | ICD-10-CM | POA: Insufficient documentation

## 2019-06-01 ENCOUNTER — Other Ambulatory Visit: Payer: Self-pay

## 2019-06-01 ENCOUNTER — Emergency Department (HOSPITAL_COMMUNITY): Payer: BLUE CROSS/BLUE SHIELD

## 2019-06-01 ENCOUNTER — Emergency Department (HOSPITAL_COMMUNITY)
Admission: EM | Admit: 2019-06-01 | Discharge: 2019-06-01 | Disposition: A | Discharge status: Home / Self Care | Payer: BLUE CROSS/BLUE SHIELD | Attending: Emergency Medicine | Admitting: Emergency Medicine

## 2019-06-01 ENCOUNTER — Encounter (HOSPITAL_COMMUNITY): Payer: Self-pay | Admitting: Emergency Medicine

## 2019-06-01 DIAGNOSIS — R0602 Shortness of breath: Secondary | ICD-10-CM

## 2019-06-01 LAB — BASIC METABOLIC PANEL
Anion gap: 13 (ref 5–15)
BUN: 6 mg/dL (ref 6–20)
CO2: 22 mmol/L (ref 22–32)
Calcium: 8.9 mg/dL (ref 8.9–10.3)
Chloride: 104 mmol/L (ref 98–111)
Creatinine, Ser: 0.82 mg/dL (ref 0.61–1.24)
GFR calc Af Amer: >60 mL/min (ref >60)
GFR calc non Af Amer: >60 mL/min (ref >60)
Glucose, Bld: 113 mg/dL — ABNORMAL HIGH (ref 70–99)
Potassium: 3.5 mmol/L (ref 3.5–5.1)
Sodium: 139 mmol/L (ref 135–145)

## 2019-06-01 LAB — CBC WITH DIFFERENTIAL/PLATELET
Abs Immature Granulocytes: 0.03 10*3/uL (ref 0.00–0.07)
Basophils Absolute: 0.1 10*3/uL (ref 0.0–0.1)
Basophils Relative: 1 %
Eosinophils Absolute: 0.1 10*3/uL (ref 0.0–0.5)
Eosinophils Relative: 2 %
HCT: 49.2 % (ref 39.0–52.0)
Hemoglobin: 14.9 g/dL (ref 13.0–17.0)
Immature Granulocytes: 1 %
Lymphocytes Relative: 50 %
Lymphs Abs: 3.2 10*3/uL (ref 0.7–4.0)
MCH: 25.5 pg — ABNORMAL LOW (ref 26.0–34.0)
MCHC: 30.3 g/dL (ref 30.0–36.0)
MCV: 84.1 fL (ref 80.0–100.0)
Monocytes Absolute: 0.9 10*3/uL (ref 0.1–1.0)
Monocytes Relative: 14 %
Neutro Abs: 2 10*3/uL (ref 1.7–7.7)
Neutrophils Relative %: 32 %
Platelets: 347 10*3/uL (ref 150–400)
RBC: 5.85 MIL/uL — ABNORMAL HIGH (ref 4.22–5.81)
RDW: 13.2 % (ref 11.5–15.5)
WBC: 6.2 10*3/uL (ref 4.0–10.5)
nRBC: 0 % (ref 0.0–0.2)

## 2019-06-01 LAB — POC SARS CORONAVIRUS 2 AG -  ED: SARS Coronavirus 2 Ag: NEGATIVE

## 2019-06-01 LAB — D-DIMER, QUANTITATIVE: D-Dimer, Quant: 0.59 ug/mL-FEU — ABNORMAL HIGH (ref 0.00–0.50)

## 2019-06-01 LAB — TROPONIN I (HIGH SENSITIVITY): Troponin I (High Sensitivity): <2 ng/L (ref <18)

## 2019-06-01 MED ORDER — SODIUM CHLORIDE (PF) 0.9 % IJ SOLN
INTRAMUSCULAR | Status: AC
  Filled 2019-06-01: qty 50

## 2019-06-01 MED ORDER — IOHEXOL 350 MG/ML SOLN
100.0000 mL | Freq: Once | INTRAVENOUS | Status: AC | PRN
  Administered 2019-06-01: 100 mL via INTRAVENOUS

## 2019-06-01 NOTE — ED Provider Notes (Signed)
[facility]-EMERGENCY DEPT Provider Note   CSN: [id number] Arrival date & time: 05/31/19  2340     History Chief Complaint  Patient presents with  . Shortness of Breath  . Possible covid    Alex Potter is a 24 y.o. male.  24 yo M with a cc of sob.  Going on past week or so.  Recently diagnosed with covid.  No vomiting, diarrhea.  Cough and fevers improved.   The history is provided by the patient.  Shortness of Breath Severity:  Moderate Onset quality:  Gradual Duration:  2 weeks Timing:  Constant Progression:  Worsening Chronicity:  New Relieved by:  Nothing Worsened by:  Exertion Ineffective treatments:  None tried Associated symptoms: cough   Associated symptoms: no abdominal pain, no chest pain, no fever, no headaches, no rash and no vomiting        Past Medical History:  Diagnosis Date  . Drug abuse (HCC)   . Tobacco abuse     Patient Active Problem List   Diagnosis Date Noted  . Heroin overdose, accidental or unintentional, initial encounter (HCC)   . Acute encephalopathy   . Sepsis (HCC) 11/03/2017  . Acute metabolic encephalopathy 11/03/2017  . Acute respiratory failure with hypoxia (HCC) 11/03/2017  . Aspiration into airway   . Lactic acidosis   . Choledocholithiasis   . Abnormal liver function 07/04/2017  . Depression 07/04/2017  . Acute cholecystitis 07/03/2017  . Gallstone 07/03/2017  . Right upper quadrant abdominal pain 07/03/2017  . Cholecystitis 07/03/2017  . Tobacco abuse   . Drug abuse [facility])     Past Surgical History:  Procedure Laterality Date  . CHOLECYSTECTOMY N/A 07/05/2017   Procedure: LAPAROSCOPIC CHOLECYSTECTOMY WITH INTRAOPERATIVE CHOLANGIOGRAM;  Surgeon: Jimmye Norman, MD;  Location: [facility] OR;  Service: General;  Laterality: N/A;  . ERCP N/A 07/04/2017   Procedure: ENDOSCOPIC RETROGRADE CHOLANGIOPANCREATOGRAPHY (ERCP);  Surgeon: Sherrilyn Rist, MD;  Location: [facility] ENDOSCOPY;  Service: Gastroenterology;   Laterality: N/A;  . INGUINAL HERNIA REPAIR Right        History reviewed. No pertinent family history.  Social History   Tobacco Use  . Smoking status: Current Every Day Smoker  . Smokeless tobacco: Never Used  Substance Use Topics  . Alcohol use: Yes  . Drug use: Yes    Types: Marijuana, Methamphetamines    Home Medications Prior to Admission medications   Medication Sig Start Date End Date Taking? Authorizing Provider  guaiFENesin (MUCINEX) 600 MG 12 hr tablet Take 1 tablet (600 mg total) by mouth 2 (two) times daily as needed for cough or to loosen phlegm. Patient not taking: Reported on 11/28/2018 11/07/17   Osvaldo Shipper, MD  ibuprofen (ADVIL,MOTRIN) 600 MG tablet Take 600 mg by mouth every 6 (six) hours as needed for fever or mild pain.    [provider]  nicotine (NICODERM CQ - DOSED IN MG/24 HOURS) 21 mg/24hr patch Place 1 patch (21 mg total) onto the skin daily. Patient not taking: Reported on 11/28/2018 11/07/17   Osvaldo Shipper, MD    Allergies    Mushroom extract complex  Review of Systems   Review of Systems  Constitutional: Positive for chills. Negative for fever.  HENT: Negative for congestion and facial swelling.   Eyes: Negative for discharge and visual disturbance.  Respiratory: Positive for cough and shortness of breath.   Cardiovascular: Negative for chest pain and palpitations.  Gastrointestinal: Negative for abdominal pain, diarrhea and vomiting.  Musculoskeletal: Negative for  arthralgias and myalgias.  Skin: Negative for color change and rash.  Neurological: Negative for tremors, syncope and headaches.  Psychiatric/Behavioral: Negative for confusion and dysphoric mood.    Physical Exam Updated Vital Signs BP 128/82 (BP Location: Left Arm)   Pulse (!) 115   Temp 99.9 F (37.7 C) (Oral)   Resp 20   SpO2 98%   Physical Exam Vitals and nursing note reviewed.  Constitutional:      Appearance: He is well-developed.  HENT:     Head:  Normocephalic and atraumatic.  Eyes:     Pupils: Pupils are equal, round, and reactive to light.  Neck:     Vascular: No JVD.  Cardiovascular:     Rate and Rhythm: Normal rate and regular rhythm.     Heart sounds: No murmur. No friction rub. No gallop.   Pulmonary:     Effort: No respiratory distress.     Breath sounds: No wheezing.  Abdominal:     General: There is no distension.     Tenderness: There is no guarding or rebound.  Musculoskeletal:        General: Normal range of motion.     Cervical back: Normal range of motion and neck supple.  Skin:    Coloration: Skin is not pale.     Findings: No rash.  Neurological:     Mental Status: He is alert and oriented to person, place, and time.  Psychiatric:        Behavior: Behavior normal.     ED Results / Procedures / Treatments   Labs (all labs ordered are listed, but only abnormal results are displayed) Labs Reviewed  D-DIMER, QUANTITATIVE (NOT AT [facility]) - Abnormal; Notable for the following components:      Result Value   D-Dimer, Quant 0.59 (*)    All other components within normal limits  CBC WITH DIFFERENTIAL/PLATELET - Abnormal; Notable for the following components:   RBC 5.85 (*)    MCH 25.5 (*)    All other components within normal limits  BASIC METABOLIC PANEL - Abnormal; Notable for the following components:   Glucose, Bld 113 (*)    All other components within normal limits  POC SARS CORONAVIRUS 2 AG -  ED  TROPONIN I (HIGH SENSITIVITY)    EKG EKG Interpretation  Date/Time:  Monday June 01 2019 00:02:25 EST Ventricular Rate:  113 PR Interval:    QRS Duration: 98 QT Interval:  317 QTC Calculation: 435 R Axis:   102 Text Interpretation: Sinus tachycardia Borderline right axis deviation Nonspecific T abnormalities, inferior leads Borderline ST elevation, anterolateral leads Rate faster Pulmonary disease pattern Otherwise no significant change Confirmed by Melene Plan [phone]) on 06/01/2019 1:16:18  AM   Radiology CT Angio Chest PE W and/or Wo Contrast  Result Date: 06/01/2019 CLINICAL DATA:  Chest pain, shortness of breath, and cough. EXAM: CT ANGIOGRAPHY CHEST WITH CONTRAST TECHNIQUE: Multidetector CT imaging of the chest was performed using the standard protocol during bolus administration of intravenous contrast. Multiplanar CT image reconstructions and MIPs were obtained to evaluate the vascular anatomy. CONTRAST:  OMNIPAQUE IOHEXOL 350 MG/ML SOLN COMPARISON:  Chest radiograph 06/01/2019 FINDINGS: Cardiovascular: Pulmonary arterial opacification is adequate, and no pulmonary emboli are identified within limitation of mild motion artifact. The thoracic aorta is normal in caliber. The heart is normal in size. There is no pericardial effusion. Mediastinum/Nodes: Small volume soft tissue anteriorly in the mediastinum, likely residual thymus. No enlarged axillary, mediastinal, or hilar lymph nodes.  Unremarkable esophagus and included portion of the thyroid. Lungs/Pleura: No pleural effusion or pneumothorax. Clear lungs. No mass. Patent central airways. Upper Abdomen: Status post cholecystectomy. Musculoskeletal: No acute osseous abnormality or suspicious osseous lesion. Review of the MIP images confirms the above findings. IMPRESSION: No evidence of pulmonary emboli or other acute abnormality in the chest. Electronically Signed   By: Logan Bores M.D.   On: 06/01/2019 04:33   DG Chest Port 1 View  Result Date: 06/01/2019 CLINICAL DATA:  Shortness of breath. EXAM: PORTABLE CHEST 1 VIEW COMPARISON:  12/03/2018 FINDINGS: The cardiomediastinal contours are normal. The lungs are clear. Pulmonary vasculature is normal. No consolidation, pleural effusion, or pneumothorax. No acute osseous abnormalities are seen. IMPRESSION: Negative portable AP view of the chest. Electronically Signed   By: Keith Rake M.D.   On: 06/01/2019 00:38    Procedures Procedures (including critical care  time)  Medications Ordered in ED Medications  sodium chloride (PF) 0.9 % injection (has no administration in time range)  iohexol (OMNIPAQUE) 350 MG/ML injection 100 mL (100 mLs Intravenous Contrast Given 06/01/19 0415)    ED Course  I have reviewed the triage vital signs and the nursing notes.  Pertinent labs & imaging results that were available during my care of the patient were reviewed by me and considered in my medical decision making (see chart for details).    MDM Rules/Calculators/A&P                      24 yo M with a cc of shortness of breath.  This is worse on exertion.  Patient was recently diagnosed with the novel coronavirus about 2 and half weeks ago.  He states this happened at an urgent care center.  Since then the cough and chills has improved significantly but he noticed that he has been short of breath.  Has had some centralized sharp chest pain with this as well.  His EKG has a new pulmonary pattern compared to prior.  Will obtain a D-dimer.  D-dimer is elevated will obtain a CT angiogram of the chest.  Plain film of the chest viewed by me negative for pneumothorax or focal infiltrate.  Keaundre Thelin was evaluated in Emergency Department on 06/01/2019 for the symptoms described in the history of present illness. He/she was evaluated in the context of the global COVID-19 pandemic, which necessitated consideration that the patient might be at risk for infection with the SARS-CoV-2 virus that causes COVID-19. Institutional protocols and algorithms that pertain to the evaluation of patients at risk for COVID-19 are in a state of rapid change based on information released by regulatory bodies including the CDC and federal and state organizations. These policies and algorithms were followed during the patient's care in the ED.  4:46 AM:  I have discussed the diagnosis/risks/treatment options with the patient and believe the pt to be eligible for discharge home to follow-up with PCP.  We also discussed returning to the ED immediately if new or worsening sx occur. We discussed the sx which are most concerning (e.g., sudden worsening pain, fever, inability to tolerate by mouth) that necessitate immediate return. Medications administered to the patient during their visit and any new prescriptions provided to the patient are listed below.  Medications given during this visit Medications  sodium chloride (PF) 0.9 % injection (has no administration in time range)  iohexol (OMNIPAQUE) 350 MG/ML injection 100 mL (100 mLs Intravenous Contrast Given 06/01/19 0415)     The  patient appears reasonably screen and/or stabilized for discharge and I doubt any other medical condition or other [facility] requiring further screening, evaluation, or treatment in the ED at this time prior to discharge.    Final Clinical Impression(s) / ED Diagnoses Final diagnoses:  SOB (shortness of breath)    Rx / DC Orders ED Discharge Orders    None       Melene Plan, DO 06/01/19 [phone]

## 2019-06-01 NOTE — Discharge Instructions (Signed)
Person Under Monitoring Name: Alex Potter  Location: [facility] [place] [address]   Infection Prevention Recommendations for Individuals Confirmed to have, or Being Evaluated for, 2019 Novel Coronavirus (COVID-19) Infection Who Receive Care at Home  Individuals who are confirmed to have, or are being evaluated for, COVID-19 should follow the prevention steps below until a healthcare provider or local or state health department says they can return to normal activities.  Stay home except to get medical care You should restrict activities outside your home, except for getting medical care. Do not go to work, school, or public areas, and do not use public transportation or taxis.  Call ahead before visiting your doctor Before your medical appointment, call the healthcare provider and tell them that you have, or are being evaluated for, COVID-19 infection. This will help the healthcare providers office take steps to keep other people from getting infected. Ask your healthcare provider to call the local or state health department.  Monitor your symptoms Seek prompt medical attention if your illness is worsening (e.g., difficulty breathing). Before going to your medical appointment, call the healthcare provider and tell them that you have, or are being evaluated for, COVID-19 infection. Ask your healthcare provider to call the local or state health department.  Wear a facemask You should wear a facemask that covers your nose and mouth when you are in the same room with other people and when you visit a healthcare provider. People who live with or visit you should also wear a facemask while they are in the same room with you.  Separate yourself from other people in your home As much as possible, you should stay in a different room from other people in your home. Also, you should use a separate bathroom, if available.  Avoid sharing household items You should not share dishes,  drinking glasses, cups, eating utensils, towels, bedding, or other items with other people in your home. After using these items, you should wash them thoroughly with soap and water.  Cover your coughs and sneezes Cover your mouth and nose with a tissue when you cough or sneeze, or you can cough or sneeze into your sleeve. Throw used tissues in a lined trash can, and immediately wash your hands with soap and water for at least 20 seconds or use an alcohol-based hand rub.  Wash your [employer] your hands often and thoroughly with soap and water for at least 20 seconds. You can use an alcohol-based hand sanitizer if soap and water are not available and if your hands are not visibly dirty. Avoid touching your eyes, nose, and mouth with unwashed hands.   Prevention Steps for Caregivers and Household Members of Individuals Confirmed to have, or Being Evaluated for, COVID-19 Infection Being Cared for in the Home  If you live with, or provide care at home for, a person confirmed to have, or being evaluated for, COVID-19 infection please follow these guidelines to prevent infection:  Follow healthcare providers instructions Make sure that you understand and can help the patient follow any healthcare provider instructions for all care.  Provide for the patients basic needs You should help the patient with basic needs in the home and provide support for getting groceries, prescriptions, and other personal needs.  Monitor the patients symptoms If they are getting sicker, call his or her medical provider and tell them that the patient has, or is being evaluated for, COVID-19 infection. This will help the healthcare providers office take steps to  keep other people from getting infected. Ask the healthcare provider to call the local or state health department.  Limit the number of people who have contact with the patient If possible, have only one caregiver for the patient. Other household  members should stay in another home or place of residence. If this is not possible, they should stay in another room, or be separated from the patient as much as possible. Use a separate bathroom, if available. Restrict visitors who do not have an essential need to be in the home.  Keep older adults, very young children, and other sick people away from the patient Keep older adults, very young children, and those who have compromised immune systems or chronic health conditions away from the patient. This includes people with chronic heart, lung, or kidney conditions, diabetes, and cancer.  Ensure good ventilation Make sure that shared spaces in the home have good air flow, such as from an air conditioner or an opened window, weather permitting.  Wash your hands often Wash your hands often and thoroughly with soap and water for at least 20 seconds. You can use an alcohol based hand sanitizer if soap and water are not available and if your hands are not visibly dirty. Avoid touching your eyes, nose, and mouth with unwashed hands. Use disposable paper towels to dry your hands. If not available, use dedicated cloth towels and replace them when they become wet.  Wear a facemask and gloves Wear a disposable facemask at all times in the room and gloves when you touch or have contact with the patients blood, body fluids, and/or secretions or excretions, such as sweat, saliva, sputum, nasal mucus, vomit, urine, or feces.  Ensure the mask fits over your nose and mouth tightly, and do not touch it during use. Throw out disposable facemasks and gloves after using them. Do not reuse. Wash your hands immediately after removing your facemask and gloves. If your personal clothing becomes contaminated, carefully remove clothing and launder. Wash your hands after handling contaminated clothing. Place all used disposable facemasks, gloves, and other waste in a lined container before disposing them with other  household waste. Remove gloves and wash your hands immediately after handling these items.  Do not share dishes, glasses, or other household items with the patient Avoid sharing household items. You should not share dishes, drinking glasses, cups, eating utensils, towels, bedding, or other items with a patient who is confirmed to have, or being evaluated for, COVID-19 infection. After the person uses these items, you should wash them thoroughly with soap and water.  Wash laundry thoroughly Immediately remove and wash clothes or bedding that have blood, body fluids, and/or secretions or excretions, such as sweat, saliva, sputum, nasal mucus, vomit, urine, or feces, on them. Wear gloves when handling laundry from the patient. Read and follow directions on labels of laundry or clothing items and detergent. In general, wash and dry with the warmest temperatures recommended on the label.  Clean all areas the individual has used often Clean all touchable surfaces, such as counters, tabletops, doorknobs, bathroom fixtures, toilets, phones, keyboards, tablets, and bedside tables, every day. Also, clean any surfaces that may have blood, body fluids, and/or secretions or excretions on them. Wear gloves when cleaning surfaces the patient has come in contact with. Use a diluted bleach solution (e.g., dilute bleach with 1 part bleach and 10 parts water) or a household disinfectant with a label that says EPA-registered for coronaviruses. To make a bleach solution at home,  add 1 tablespoon of bleach to 1 quart (4 cups) of water. For a larger supply, add  cup of bleach to 1 gallon (16 cups) of water. Read labels of cleaning products and follow recommendations provided on product labels. Labels contain instructions for safe and effective use of the cleaning product including precautions you should take when applying the product, such as wearing gloves or eye protection and making sure you have good ventilation  during use of the product. Remove gloves and wash hands immediately after cleaning.  Monitor yourself for signs and symptoms of illness Caregivers and household members are considered close contacts, should monitor their health, and will be asked to limit movement outside of the home to the extent possible. Follow the monitoring steps for close contacts listed on the symptom monitoring form.   ? If you have additional questions, contact your local health department or call the epidemiologist on call at [phone] (available 24/7). ? This guidance is subject to change. For the most up-to-date guidance from [facility], please refer to their website: [employer]Blogs.pl

## 2019-07-29 ENCOUNTER — Inpatient Hospital Stay (HOSPITAL_COMMUNITY)
Admission: EM | Admit: 2019-07-29 | Discharge: 2019-08-01 | DRG: 917 | Disposition: A | Discharge status: Home / Self Care | Payer: BLUE CROSS/BLUE SHIELD | Attending: Family Medicine | Admitting: Family Medicine

## 2019-07-29 ENCOUNTER — Other Ambulatory Visit: Payer: Self-pay

## 2019-07-29 ENCOUNTER — Emergency Department (HOSPITAL_COMMUNITY): Payer: BLUE CROSS/BLUE SHIELD

## 2019-07-29 ENCOUNTER — Encounter (HOSPITAL_COMMUNITY): Payer: Self-pay

## 2019-07-29 DIAGNOSIS — F1910 Other psychoactive substance abuse, uncomplicated: Secondary | ICD-10-CM

## 2019-07-29 DIAGNOSIS — J690 Pneumonitis due to inhalation of food and vomit: Secondary | ICD-10-CM

## 2019-07-29 DIAGNOSIS — R6510 Systemic inflammatory response syndrome (SIRS) of non-infectious origin without acute organ dysfunction: Secondary | ICD-10-CM

## 2019-07-29 DIAGNOSIS — Y9259 Other trade areas as the place of occurrence of the external cause: Secondary | ICD-10-CM

## 2019-07-29 DIAGNOSIS — A5149 Other secondary syphilitic conditions: Secondary | ICD-10-CM | POA: Diagnosis present

## 2019-07-29 DIAGNOSIS — J329 Chronic sinusitis, unspecified: Secondary | ICD-10-CM | POA: Diagnosis present

## 2019-07-29 DIAGNOSIS — H9190 Unspecified hearing loss, unspecified ear: Secondary | ICD-10-CM | POA: Diagnosis present

## 2019-07-29 DIAGNOSIS — N485 Ulcer of penis: Secondary | ICD-10-CM | POA: Diagnosis present

## 2019-07-29 DIAGNOSIS — J9601 Acute respiratory failure with hypoxia: Secondary | ICD-10-CM | POA: Diagnosis present

## 2019-07-29 DIAGNOSIS — N170 Acute kidney failure with tubular necrosis: Secondary | ICD-10-CM

## 2019-07-29 DIAGNOSIS — T401X1A Poisoning by heroin, accidental (unintentional), initial encounter: Secondary | ICD-10-CM | POA: Diagnosis present

## 2019-07-29 DIAGNOSIS — F1410 Cocaine abuse, uncomplicated: Secondary | ICD-10-CM | POA: Diagnosis present

## 2019-07-29 DIAGNOSIS — G9340 Encephalopathy, unspecified: Secondary | ICD-10-CM

## 2019-07-29 DIAGNOSIS — Z91018 Allergy to other foods: Secondary | ICD-10-CM

## 2019-07-29 DIAGNOSIS — R21 Rash and other nonspecific skin eruption: Secondary | ICD-10-CM | POA: Diagnosis not present

## 2019-07-29 DIAGNOSIS — A539 Syphilis, unspecified: Secondary | ICD-10-CM

## 2019-07-29 DIAGNOSIS — F17200 Nicotine dependence, unspecified, uncomplicated: Secondary | ICD-10-CM | POA: Diagnosis present

## 2019-07-29 DIAGNOSIS — N179 Acute kidney failure, unspecified: Secondary | ICD-10-CM | POA: Diagnosis present

## 2019-07-29 DIAGNOSIS — Z20822 Contact with and (suspected) exposure to covid-19: Secondary | ICD-10-CM | POA: Diagnosis present

## 2019-07-29 DIAGNOSIS — G92 Toxic encephalopathy: Secondary | ICD-10-CM | POA: Diagnosis present

## 2019-07-29 DIAGNOSIS — R404 Transient alteration of awareness: Secondary | ICD-10-CM | POA: Diagnosis present

## 2019-07-29 MED ORDER — NALOXONE HCL 0.4 MG/ML IJ SOLN
0.4000 mg | Freq: Once | INTRAMUSCULAR | Status: AC
  Administered 2019-07-29: 0.4 mg via INTRAVENOUS
  Filled 2019-07-29: qty 1

## 2019-07-29 MED ORDER — ONDANSETRON HCL 4 MG/2ML IJ SOLN
4.0000 mg | Freq: Once | INTRAMUSCULAR | Status: AC
  Administered 2019-07-29: 4 mg via INTRAVENOUS
  Filled 2019-07-29: qty 2

## 2019-07-29 MED ORDER — SODIUM CHLORIDE 0.9 % IV BOLUS
500.0000 mL | Freq: Once | INTRAVENOUS | Status: AC
  Administered 2019-07-29: 500 mL via INTRAVENOUS

## 2019-07-29 NOTE — ED Provider Notes (Signed)
[facility]-EMERGENCY DEPT Provider Note   CSN: [id number] Arrival date & time: 07/29/19  2324     History Chief Complaint  Patient presents with  . Drug Overdose    Alex Potter is a 24 y.o. male.   24 year old male with history of heroin abuse presents to the emergency department after being found unresponsive at the [facility]. EMS called by people that were staying in the room with him. Patient states that he "partied too hard" and accidentally overdosed on heroin. He responded to 2 mg Narcan given by fire department on arrival. He is presently complaining of shortness of breath.  Has a rash to his trunk and extremities; reports being diagnosed with scabies at urgent care, but never picked up his Rx.  The history is provided by the patient. No language interpreter was used.  Drug Overdose       Past Medical History:  Diagnosis Date  . Drug abuse (HCC)   . Tobacco abuse     Patient Active Problem List   Diagnosis Date Noted  . Aspiration pneumonitis (HCC) 07/30/2019  . Diffuse papular rash - rule out syphilis vs scabies 07/30/2019  . Heroin overdose (HCC) 07/30/2019  . Heroin overdose, accidental or unintentional, initial encounter (HCC)   . Acute encephalopathy   . Sepsis (HCC) 11/03/2017  . Acute metabolic encephalopathy 11/03/2017  . Acute respiratory failure with hypoxia (HCC) 11/03/2017  . Aspiration into airway   . Lactic acidosis   . Choledocholithiasis   . Abnormal liver function 07/04/2017  . Depression 07/04/2017  . Acute cholecystitis 07/03/2017  . Gallstone 07/03/2017  . Right upper quadrant abdominal pain 07/03/2017  . Cholecystitis 07/03/2017  . Tobacco abuse   . Polysubstance abuse [facility])     Past Surgical History:  Procedure Laterality Date  . CHOLECYSTECTOMY N/A 07/05/2017   Procedure: LAPAROSCOPIC CHOLECYSTECTOMY WITH INTRAOPERATIVE CHOLANGIOGRAM;  Surgeon: Jimmye Norman, MD;  Location: [facility] OR;  Service: General;   Laterality: N/A;  . ERCP N/A 07/04/2017   Procedure: ENDOSCOPIC RETROGRADE CHOLANGIOPANCREATOGRAPHY (ERCP);  Surgeon: Sherrilyn Rist, MD;  Location: [facility] ENDOSCOPY;  Service: Gastroenterology;  Laterality: N/A;  . INGUINAL HERNIA REPAIR Right        History reviewed. No pertinent family history.  Social History   Tobacco Use  . Smoking status: Current Every Day Smoker  . Smokeless tobacco: Never Used  Substance Use Topics  . Alcohol use: Yes  . Drug use: Yes    Types: Marijuana, Methamphetamines    Home Medications Prior to Admission medications   Medication Sig Start Date End Date Taking? Authorizing Provider  guaiFENesin (MUCINEX) 600 MG 12 hr tablet Take 1 tablet (600 mg total) by mouth 2 (two) times daily as needed for cough or to loosen phlegm. Patient not taking: Reported on 11/28/2018 11/07/17   Osvaldo Shipper, MD  nicotine (NICODERM CQ - DOSED IN MG/24 HOURS) 21 mg/24hr patch Place 1 patch (21 mg total) onto the skin daily. Patient not taking: Reported on 11/28/2018 11/07/17   Osvaldo Shipper, MD    Allergies    Mushroom extract complex  Review of Systems   Review of Systems  Ten systems reviewed and are negative for acute change, except as noted in the HPI.    Physical Exam Updated Vital Signs BP 134/90   Pulse 98   Resp (!) 44   SpO2 99%   Physical Exam Vitals and nursing note reviewed.  Constitutional:      General: He is  not in acute distress.    Appearance: He is well-developed. He is not diaphoretic.     Comments: Alert, but slightly sleepy when not stimulated. Speech is clear. Answers questions appropriately.  HENT:     Head: Normocephalic and atraumatic.  Eyes:     General: No scleral icterus.    Conjunctiva/sclera: Conjunctivae normal.  Cardiovascular:     Rate and Rhythm: Normal rate and regular rhythm.     Pulses: Normal pulses.  Pulmonary:     Effort: No respiratory distress.     Breath sounds: No stridor. No wheezing.     Comments:  Tachypnea. Sats 82 to 86% on room air. No respiratory distress. Musculoskeletal:        General: Normal range of motion.     Cervical back: Normal range of motion.  Skin:    General: Skin is warm and dry.     Coloration: Skin is not pale.     Findings: Rash (diffuse maculopapular rash to trunk and extremities) present. No erythema.  Neurological:     General: No focal deficit present.     Mental Status: He is alert and oriented to person, place, and time.     Coordination: Coordination normal.     Comments: GCS 15. A&Ox4. Moving all extremities spontaneously.  Psychiatric:        Behavior: Behavior normal.     ED Results / Procedures / Treatments   Labs (all labs ordered are listed, but only abnormal results are displayed) Labs Reviewed  CBC WITH DIFFERENTIAL/PLATELET - Abnormal; Notable for the following components:      Result Value   MCH 25.4 (*)    All other components within normal limits  BASIC METABOLIC PANEL - Abnormal; Notable for the following components:   Potassium 5.8 (*)    Glucose, Bld 101 (*)    Creatinine, Ser 1.25 (*)    Calcium 8.2 (*)    All other components within normal limits  RAPID URINE DRUG SCREEN, HOSP PERFORMED - Abnormal; Notable for the following components:   Cocaine POSITIVE (*)    Tetrahydrocannabinol POSITIVE (*)    All other components within normal limits  BLOOD GAS, VENOUS - Abnormal; Notable for the following components:   pCO2, Ven 61.9 (*)    pO2, Ven 46.7 (*)    Bicarbonate 28.4 (*)    All other components within normal limits  POTASSIUM  ETHANOL  RPR  HEPATIC FUNCTION PANEL  HIV ANTIBODY (ROUTINE TESTING W REFLEX)    EKG None   ED ECG REPORT   Date: 07/30/2019  Rate: 66  Rhythm: normal sinus rhythm  QRS Axis: normal  Intervals: normal  ST/T Wave abnormalities: normal  Conduction Disutrbances:none  Narrative Interpretation: Normal sinus rhythm. No ischemic change.  Old EKG Reviewed: none available  I have  personally reviewed the EKG tracing and agree with the computerized printout as noted.   Radiology DG Chest Port 1 View  Result Date: 07/30/2019 CLINICAL DATA:  24 year old male with overdose. Concern for aspiration. EXAM: PORTABLE CHEST 1 VIEW COMPARISON:  Chest radiograph dated 06/01/2019. FINDINGS: Left perihilar and infrahilar faint reticulonodular densities may represent atelectasis or aspiration. Clinical correlation is recommended. No lobar consolidation, pleural effusion or pneumothorax. The cardiac silhouette is within limits. No acute osseous pathology. IMPRESSION: Left perihilar and infrahilar atelectasis versus aspiration. Electronically Signed   By: Elgie Collard M.D.   On: 07/30/2019 00:12    Procedures .Critical Care Performed by: Antony Madura, PA-C Authorized by: Antony Madura,  PA-C   Critical care provider statement:    Critical care time (minutes):  45   Critical care was necessary to treat or prevent imminent or life-threatening deterioration of the following conditions:  Respiratory failure   Critical care was time spent personally by me on the following activities:  Discussions with consultants, evaluation of patient's response to treatment, examination of patient, ordering and performing treatments and interventions, ordering and review of laboratory studies, ordering and review of radiographic studies, pulse oximetry, re-evaluation of patient's condition, obtaining history from patient or surrogate and review of old charts   (including critical care time)  Medications Ordered in ED Medications  ondansetron (ZOFRAN) injection 4 mg (4 mg Intravenous Given 07/29/19 2353)  naloxone [facility]) injection 0.4 mg (0.4 mg Intravenous Given 07/29/19 2355)  sodium chloride 0.9 % bolus 500 mL (0 mLs Intravenous Stopped 07/30/19 0117)  naloxone (NARCAN) injection 1 mg (1 mg Intravenous Given 07/30/19 0013)  famotidine (PEPCID) IVPB 20 mg premix (0 mg Intravenous Stopped 07/30/19 0117)   promethazine (PHENERGAN) injection 12.5 mg (12.5 mg Intravenous Given 07/30/19 0034)  sodium chloride 0.9 % bolus 500 mL (500 mLs Intravenous New Bag/Given 07/30/19 0243)  ondansetron (ZOFRAN) injection 4 mg (4 mg Intravenous Given 07/30/19 0241)  naloxone (NARCAN) injection 1 mg (1 mg Intravenous Given 07/30/19 0241)    ED Course  I have reviewed the triage vital signs and the nursing notes.  Pertinent labs & imaging results that were available during my care of the patient were reviewed by me and considered in my medical decision making (see chart for details).  Clinical Course as of Jul 29 332  Thu Jul 30, 2019  0011 Sats 92% on 6L via [place] after 0.4mg  Narcan. Will give additional 1mg  IV.   [KH]  7564 Patient with large volume emesis. Phenergan ordered.   [PP]  2951 Patient sleeping. HR has improved. Sats remain stable. Labs suggestive of mild dehydration. Patient hydrated in the ED with IVF.   [KH]  0225 Sats in the upper 80's, around 88%, but mouth breathing while on nasal cannula. He is more tachypneic. Will give additional IV Narcan with IV Zofran to try and prevent recurrent emesis.  Upon further evaluation of rash, while scabies remains a potential cause, RPR added to r/o secondary syphilis. Hepatic function panel and VBG added with UDS. Will continue to monitor. May need admission for prolonged observation given potential for mild aspiration on CXR.   [KH]  0307 VBG reassuring. Now satting 97% on 6L supplemental O2 with mask. UDS positive for cocaine and marijuana.   [KH]    Clinical Course User Index [KH] Beverely Pace   MDM Rules/Calculators/A&P                      24 year old male presents to the emergency department via EMS after being found unresponsive by fire department following accidental overdose on heroin.  Patient given 2 mg Narcan prior to arrival.  Subsequently given additional 2.4 mg Narcan over ED course for persistent tachypnea and hypoxia.  Patient  continues to [facility] well, but intermittently complains of shortness of breath.  He is tachypneic and with persistent hypoxia there exists concern for aspiration pneumonia.  Chest x-ray notable for densities in the left perihilar and infrahilar regions consistent with aspiration versus atelectasis.  Patient with mild AKI managed with IV fluids.  No metabolic acidosis or leukocytosis.  He is afebrile.  Meets criteria for SIRS, but there is no present  concern for sepsis.  As an aside, patient noted to have a maculopapular erythematous rash to his trunk and extremities.  Was told at urgent care that he may have scabies, but has no changes to his interdigital regions.  RPR added to work up to r/o secondary syphilis.  Plan for admission to the hospital for continued observation and management given need for supplemental oxygen.  Presently satting 97-99% on 6L O2.  Dr. Imogene Burn to assess for admission.   Final Clinical Impression(s) / ED Diagnoses Final diagnoses:  Aspiration pneumonia of left lung, unspecified aspiration pneumonia type, unspecified part of lung (HCC)  Acute respiratory failure with hypoxia (HCC)  SIRS (systemic inflammatory response syndrome) (HCC)  Polysubstance abuse (HCC)  Rash and nonspecific skin eruption    Rx / DC Orders ED Discharge Orders    None       Antony Madura, PA-C 07/30/19 0338    Paula Libra, MD 07/30/19 [phone]

## 2019-07-29 NOTE — ED Triage Notes (Signed)
Pt was found unresponsive at the [facility] called in by people that were in the room with him Pt states that he was doing heroin and responded to 2mg  of narcan by the fore department

## 2019-07-29 NOTE — ED Notes (Signed)
Pt has a generalized rash all over him, he states that he ws told that he had scabies but doesn't have any medications for it

## 2019-07-30 ENCOUNTER — Encounter (HOSPITAL_COMMUNITY): Payer: Self-pay | Admitting: Internal Medicine

## 2019-07-30 DIAGNOSIS — F1910 Other psychoactive substance abuse, uncomplicated: Secondary | ICD-10-CM | POA: Diagnosis not present

## 2019-07-30 DIAGNOSIS — R21 Rash and other nonspecific skin eruption: Secondary | ICD-10-CM

## 2019-07-30 DIAGNOSIS — G92 Toxic encephalopathy: Secondary | ICD-10-CM | POA: Diagnosis present

## 2019-07-30 DIAGNOSIS — R825 Elevated urine levels of drugs, medicaments and biological substances: Secondary | ICD-10-CM | POA: Diagnosis not present

## 2019-07-30 DIAGNOSIS — N485 Ulcer of penis: Secondary | ICD-10-CM | POA: Diagnosis present

## 2019-07-30 DIAGNOSIS — T401X1A Poisoning by heroin, accidental (unintentional), initial encounter: Principal | ICD-10-CM

## 2019-07-30 DIAGNOSIS — N179 Acute kidney failure, unspecified: Secondary | ICD-10-CM

## 2019-07-30 DIAGNOSIS — Y9259 Other trade areas as the place of occurrence of the external cause: Secondary | ICD-10-CM | POA: Diagnosis not present

## 2019-07-30 DIAGNOSIS — H9190 Unspecified hearing loss, unspecified ear: Secondary | ICD-10-CM | POA: Diagnosis present

## 2019-07-30 DIAGNOSIS — A5149 Other secondary syphilitic conditions: Secondary | ICD-10-CM

## 2019-07-30 DIAGNOSIS — J690 Pneumonitis due to inhalation of food and vomit: Secondary | ICD-10-CM

## 2019-07-30 DIAGNOSIS — J329 Chronic sinusitis, unspecified: Secondary | ICD-10-CM | POA: Diagnosis present

## 2019-07-30 DIAGNOSIS — F1410 Cocaine abuse, uncomplicated: Secondary | ICD-10-CM | POA: Diagnosis present

## 2019-07-30 DIAGNOSIS — N170 Acute kidney failure with tubular necrosis: Secondary | ICD-10-CM

## 2019-07-30 DIAGNOSIS — F17200 Nicotine dependence, unspecified, uncomplicated: Secondary | ICD-10-CM

## 2019-07-30 DIAGNOSIS — R404 Transient alteration of awareness: Secondary | ICD-10-CM | POA: Diagnosis present

## 2019-07-30 DIAGNOSIS — J9601 Acute respiratory failure with hypoxia: Secondary | ICD-10-CM | POA: Diagnosis present

## 2019-07-30 DIAGNOSIS — Z91018 Allergy to other foods: Secondary | ICD-10-CM | POA: Diagnosis not present

## 2019-07-30 DIAGNOSIS — Z20822 Contact with and (suspected) exposure to covid-19: Secondary | ICD-10-CM | POA: Diagnosis present

## 2019-07-30 LAB — CBC WITH DIFFERENTIAL/PLATELET
Abs Immature Granulocytes: 0.02 10*3/uL (ref 0.00–0.07)
Abs Immature Granulocytes: 0.16 10*3/uL — ABNORMAL HIGH (ref 0.00–0.07)
Basophils Absolute: 0 10*3/uL (ref 0.0–0.1)
Basophils Absolute: 0 10*3/uL (ref 0.0–0.1)
Basophils Relative: 0 %
Basophils Relative: 0 %
Eosinophils Absolute: 0 10*3/uL (ref 0.0–0.5)
Eosinophils Absolute: 0.1 10*3/uL (ref 0.0–0.5)
Eosinophils Relative: 0 %
Eosinophils Relative: 1 %
HCT: 45.5 % (ref 39.0–52.0)
HCT: 45.7 % (ref 39.0–52.0)
Hemoglobin: 14 g/dL (ref 13.0–17.0)
Hemoglobin: 14.2 g/dL (ref 13.0–17.0)
Immature Granulocytes: 0 %
Immature Granulocytes: 1 %
Lymphocytes Relative: 12 %
Lymphocytes Relative: 9 %
Lymphs Abs: 1 10*3/uL (ref 0.7–4.0)
Lymphs Abs: 1.3 10*3/uL (ref 0.7–4.0)
MCH: 25.4 pg — ABNORMAL LOW (ref 26.0–34.0)
MCH: 25.5 pg — ABNORMAL LOW (ref 26.0–34.0)
MCHC: 30.8 g/dL (ref 30.0–36.0)
MCHC: 31.1 g/dL (ref 30.0–36.0)
MCV: 81.9 fL (ref 80.0–100.0)
MCV: 83 fL (ref 80.0–100.0)
Monocytes Absolute: 0.6 10*3/uL (ref 0.1–1.0)
Monocytes Absolute: 0.9 10*3/uL (ref 0.1–1.0)
Monocytes Relative: 6 %
Monocytes Relative: 7 %
Neutro Abs: 12.8 10*3/uL — ABNORMAL HIGH (ref 1.7–7.7)
Neutro Abs: 6.9 10*3/uL (ref 1.7–7.7)
Neutrophils Relative %: 80 %
Neutrophils Relative %: 84 %
Platelets: 279 10*3/uL (ref 150–400)
Platelets: 352 10*3/uL (ref 150–400)
RBC: 5.48 MIL/uL (ref 4.22–5.81)
RBC: 5.58 MIL/uL (ref 4.22–5.81)
RDW: 14.3 % (ref 11.5–15.5)
RDW: 14.6 % (ref 11.5–15.5)
WBC: 15.2 10*3/uL — ABNORMAL HIGH (ref 4.0–10.5)
WBC: 8.7 10*3/uL (ref 4.0–10.5)
nRBC: 0 % (ref 0.0–0.2)
nRBC: 0 % (ref 0.0–0.2)

## 2019-07-30 LAB — RAPID URINE DRUG SCREEN, HOSP PERFORMED
Amphetamines: NOT DETECTED
Barbiturates: NOT DETECTED
Benzodiazepines: NOT DETECTED
Cocaine: POSITIVE — AB
Opiates: NOT DETECTED
Tetrahydrocannabinol: POSITIVE — AB

## 2019-07-30 LAB — BASIC METABOLIC PANEL
Anion gap: 9 (ref 5–15)
BUN: 8 mg/dL (ref 6–20)
CO2: 27 mmol/L (ref 22–32)
Calcium: 8.2 mg/dL — ABNORMAL LOW (ref 8.9–10.3)
Chloride: 101 mmol/L (ref 98–111)
Creatinine, Ser: 1.25 mg/dL — ABNORMAL HIGH (ref 0.61–1.24)
GFR calc Af Amer: >60 mL/min (ref >60)
GFR calc non Af Amer: >60 mL/min (ref >60)
Glucose, Bld: 101 mg/dL — ABNORMAL HIGH (ref 70–99)
Potassium: 5.8 mmol/L — ABNORMAL HIGH (ref 3.5–5.1)
Sodium: 137 mmol/L (ref 135–145)

## 2019-07-30 LAB — COMPREHENSIVE METABOLIC PANEL
ALT: 15 U/L (ref 0–44)
AST: 24 U/L (ref 15–41)
Albumin: 3.1 g/dL — ABNORMAL LOW (ref 3.5–5.0)
Alkaline Phosphatase: 74 U/L (ref 38–126)
Anion gap: 7 (ref 5–15)
BUN: 8 mg/dL (ref 6–20)
CO2: 26 mmol/L (ref 22–32)
Calcium: 8 mg/dL — ABNORMAL LOW (ref 8.9–10.3)
Chloride: 103 mmol/L (ref 98–111)
Creatinine, Ser: 0.85 mg/dL (ref 0.61–1.24)
GFR calc Af Amer: >60 mL/min (ref >60)
GFR calc non Af Amer: >60 mL/min (ref >60)
Glucose, Bld: 106 mg/dL — ABNORMAL HIGH (ref 70–99)
Potassium: 3.9 mmol/L (ref 3.5–5.1)
Sodium: 136 mmol/L (ref 135–145)
Total Bilirubin: 1.1 mg/dL (ref 0.3–1.2)
Total Protein: 6.9 g/dL (ref 6.5–8.1)

## 2019-07-30 LAB — BLOOD GAS, VENOUS
Acid-Base Excess: 0.5 mmol/L (ref 0.0–2.0)
Bicarbonate: 28.4 mmol/L — ABNORMAL HIGH (ref 20.0–28.0)
O2 Saturation: 75.5 %
Patient temperature: 98.6
pCO2, Ven: 61.9 mmHg — ABNORMAL HIGH (ref 44.0–60.0)
pH, Ven: 7.284 (ref 7.250–7.430)
pO2, Ven: 46.7 mmHg — ABNORMAL HIGH (ref 32.0–45.0)

## 2019-07-30 LAB — HEPATIC FUNCTION PANEL
ALT: 14 U/L (ref 0–44)
AST: 27 U/L (ref 15–41)
Albumin: 3.3 g/dL — ABNORMAL LOW (ref 3.5–5.0)
Alkaline Phosphatase: 80 U/L (ref 38–126)
Bilirubin, Direct: 0.3 mg/dL — ABNORMAL HIGH (ref 0.0–0.2)
Indirect Bilirubin: 0.4 mg/dL (ref 0.3–0.9)
Total Bilirubin: 0.7 mg/dL (ref 0.3–1.2)
Total Protein: 7.1 g/dL (ref 6.5–8.1)

## 2019-07-30 LAB — HIV ANTIBODY (ROUTINE TESTING W REFLEX): HIV Screen 4th Generation wRfx: NONREACTIVE

## 2019-07-30 LAB — RPR
RPR Ser Ql: REACTIVE — AB
RPR Titer: 1:64 {titer}

## 2019-07-30 LAB — ETHANOL: Alcohol, Ethyl (B): <10 mg/dL (ref <10)

## 2019-07-30 LAB — SARS CORONAVIRUS 2 (TAT 6-24 HRS): SARS Coronavirus 2: NEGATIVE

## 2019-07-30 LAB — PROCALCITONIN: Procalcitonin: 0.93 ng/mL

## 2019-07-30 LAB — POTASSIUM: Potassium: 4 mmol/L (ref 3.5–5.1)

## 2019-07-30 MED ORDER — NALOXONE HCL 0.4 MG/ML IJ SOLN: 0.4000 mg | INTRAMUSCULAR | Status: DC | PRN

## 2019-07-30 MED ORDER — IPRATROPIUM-ALBUTEROL 0.5-2.5 (3) MG/3ML IN SOLN: 3.0000 mL | Freq: Four times a day (QID) | RESPIRATORY_TRACT | Status: DC | PRN

## 2019-07-30 MED ORDER — ACETAMINOPHEN 325 MG PO TABS
650.0000 mg | ORAL_TABLET | Freq: Four times a day (QID) | ORAL | Status: DC | PRN
  Administered 2019-07-31 – 2019-08-01 (×2): 650 mg via ORAL
  Filled 2019-07-30 (×2): qty 2

## 2019-07-30 MED ORDER — NALOXONE HCL 2 MG/2ML IJ SOSY
1.0000 mg | PREFILLED_SYRINGE | Freq: Once | INTRAMUSCULAR | Status: AC
  Administered 2019-07-30: 1 mg via INTRAVENOUS
  Filled 2019-07-30: qty 2

## 2019-07-30 MED ORDER — SENNOSIDES-DOCUSATE SODIUM 8.6-50 MG PO TABS: 1.0000 | ORAL_TABLET | Freq: Every evening | ORAL | Status: DC | PRN

## 2019-07-30 MED ORDER — ENOXAPARIN SODIUM 40 MG/0.4ML ~~LOC~~ SOLN
40.0000 mg | SUBCUTANEOUS | Status: DC
  Administered 2019-07-30: 40 mg via SUBCUTANEOUS
  Filled 2019-07-30: qty 0.4

## 2019-07-30 MED ORDER — SODIUM CHLORIDE 0.9 % IV SOLN
3.0000 g | Freq: Four times a day (QID) | INTRAVENOUS | Status: DC
  Administered 2019-07-30 – 2019-08-01 (×6): 3 g via INTRAVENOUS
  Filled 2019-07-30: qty 3
  Filled 2019-07-30 (×3): qty 8
  Filled 2019-07-30: qty 3
  Filled 2019-07-30 (×2): qty 8
  Filled 2019-07-30: qty 3
  Filled 2019-07-30: qty 8

## 2019-07-30 MED ORDER — PENICILLIN G BENZATHINE 1200000 UNIT/2ML IM SUSP
2.4000 10*6.[IU] | Freq: Once | INTRAMUSCULAR | Status: AC
  Administered 2019-07-30: 2.4 10*6.[IU] via INTRAMUSCULAR
  Filled 2019-07-30 (×2): qty 4

## 2019-07-30 MED ORDER — ONDANSETRON HCL 4 MG/2ML IJ SOLN: 4.0000 mg | Freq: Four times a day (QID) | INTRAMUSCULAR | Status: DC | PRN

## 2019-07-30 MED ORDER — LACTATED RINGERS IV SOLN: INTRAVENOUS | Status: AC

## 2019-07-30 MED ORDER — PIPERACILLIN-TAZOBACTAM 3.375 G IVPB: 3.3750 g | Freq: Three times a day (TID) | INTRAVENOUS | Status: DC

## 2019-07-30 MED ORDER — PROMETHAZINE HCL 25 MG/ML IJ SOLN
12.5000 mg | INTRAMUSCULAR | Status: AC
  Administered 2019-07-30: 12.5 mg via INTRAVENOUS
  Filled 2019-07-30: qty 1

## 2019-07-30 MED ORDER — FAMOTIDINE IN NACL 20-0.9 MG/50ML-% IV SOLN
20.0000 mg | Freq: Once | INTRAVENOUS | Status: AC
  Administered 2019-07-30: 01:00:00 20 mg via INTRAVENOUS
  Filled 2019-07-30: qty 50

## 2019-07-30 MED ORDER — ACETAMINOPHEN 650 MG RE SUPP: 650.0000 mg | Freq: Four times a day (QID) | RECTAL | Status: DC | PRN

## 2019-07-30 MED ORDER — PIPERACILLIN-TAZOBACTAM 3.375 G IVPB 30 MIN
3.3750 g | Freq: Four times a day (QID) | INTRAVENOUS | Status: DC
  Administered 2019-07-30 (×2): 3.375 g via INTRAVENOUS
  Filled 2019-07-30 (×3): qty 50

## 2019-07-30 MED ORDER — CLONIDINE HCL 0.1 MG PO TABS
0.1000 mg | ORAL_TABLET | ORAL | Status: DC | PRN
  Administered 2019-07-31 – 2019-08-01 (×3): 0.1 mg via ORAL
  Filled 2019-07-30 (×3): qty 1

## 2019-07-30 MED ORDER — SODIUM CHLORIDE 0.9 % IV BOLUS
500.0000 mL | Freq: Once | INTRAVENOUS | Status: AC
  Administered 2019-07-30: 500 mL via INTRAVENOUS

## 2019-07-30 MED ORDER — ONDANSETRON HCL 4 MG PO TABS: 4.0000 mg | ORAL_TABLET | Freq: Four times a day (QID) | ORAL | Status: DC | PRN

## 2019-07-30 MED ORDER — NICOTINE 21 MG/24HR TD PT24
21.0000 mg | MEDICATED_PATCH | Freq: Every day | TRANSDERMAL | Status: DC
  Administered 2019-07-30 – 2019-07-31 (×2): 21 mg via TRANSDERMAL
  Filled 2019-07-30 (×2): qty 1

## 2019-07-30 MED ORDER — ONDANSETRON HCL 4 MG/2ML IJ SOLN
4.0000 mg | Freq: Once | INTRAMUSCULAR | Status: AC
  Administered 2019-07-30: 4 mg via INTRAVENOUS
  Filled 2019-07-30: qty 2

## 2019-07-30 NOTE — Consult Note (Addendum)
[facility]    Date of Admission:  07/29/2019   Total days of antibiotics: 0               Reason for Consult: Syphilis    Referring Provider: Jarvis NewcomerGrunz   Assessment: Polysubstance abuse Heroin OD?  UDS+ cocaine and THC Syphilis, secondary AKI (1.25 --> 0.85)  Plan: 1. Pen G 2.4 million units IM x 1 2. LP if he consents (I suspect he will leave AMA soon) 3. Withdrawal treatment 4. GC/chlamydia 5. Acute hepatitis panel 6. Change zosyn to unasyn for potential aspiration A. Can be d/c with augmentin if needed.   Comment- It is possible that he has neurosyphilis but he has no meningitis signs. I doubt he will consent to LP but if he does please send for RPR and VDRL.   Thank you so much for this interesting consult,  Principal Problem:   Heroin overdose, accidental or unintentional, initial encounter [facility](HCC) Active Problems:   Polysubstance abuse (HCC)   Acute respiratory failure with hypoxia (HCC)   Aspiration pneumonitis (HCC)   Diffuse papular rash - rule out syphilis vs scabies   AKI (acute kidney injury) (HCC)   Heroin overdose (HCC)     HPI: Alex Potter is a 24 y.o. male with hx of polysubs abuse who [facility] after becoming unresponsive. When asked what happened "I was with my friends and I took too much drugs". Was it heroin? "Yes". He was given narcan  And improved.  He was noted in ED to be hypoxic and also to have a diffuse rash.  Currently he complains of thirst, dry mouth and needing to leave to find his friends and get his stuff.  On examining him, he becomes irritated.   Review of Systems: Review of Systems  Constitutional: Negative for chills and fever.  Eyes: Negative for photophobia.  Skin: Positive for rash.  Neurological: Negative for sensory change.  denies oral ulcers.  Please see HPI. All other systems reviewed and negative.   Past Medical History:  Diagnosis Date  . Drug abuse (HCC)   .  Tobacco abuse     Social History   Tobacco Use  . Smoking status: Current Every Day Smoker  . Smokeless tobacco: Never Used  Substance Use Topics  . Alcohol use: Yes  . Drug use: Yes    Types: Marijuana, Methamphetamines    History reviewed. No pertinent family history.   Medications: Scheduled:   Abtx:  Anti-infectives (From admission, onward)   Start     Dose/Rate Route Frequency Ordered Stop   07/30/19 2000  piperacillin-tazobactam (ZOSYN) IVPB 3.375 g     3.375 g 12.5 mL/hr over 240 Minutes Intravenous Every 8 hours 07/30/19 1241     07/30/19 0600  piperacillin-tazobactam (ZOSYN) IVPB 3.375 g  Status:  Discontinued     3.375 g 100 mL/hr over 30 Minutes Intravenous Every 6 hours 07/30/19 0437 07/30/19 1239        OBJECTIVE: Blood pressure 123/72, pulse 64, resp. rate (!) 22, height 5\' 7"  (1.702 m), weight 79.4 kg, SpO2 100 %.  Physical Exam Constitutional:      Appearance: He is ill-appearing. He is not diaphoretic.  HENT:     Mouth/Throat:     Mouth: Mucous membranes are moist.     Pharynx: No oropharyngeal exudate.  Eyes:     Extraocular Movements: Extraocular movements intact.     Pupils: Pupils  are equal, round, and reactive to light.  Cardiovascular:     Rate and Rhythm: Normal rate and regular rhythm.  Pulmonary:     Effort: Pulmonary effort is normal.     Breath sounds: Normal breath sounds.  Abdominal:     General: Bowel sounds are normal. There is no distension.     Palpations: Abdomen is soft.     Tenderness: There is no abdominal tenderness.  Genitourinary:    Pubic Area: Rash present.     Penis: Circumcised.     Musculoskeletal:     Cervical back: Normal range of motion and neck supple. No rigidity.  Lymphadenopathy:     Lower Body: No right inguinal adenopathy.  Skin:    General: Skin is warm and dry.     Findings: Rash (diffuse macular rash) present.  Neurological:     Mental Status: He is alert.     Lab Results Results for  orders placed or performed during the hospital encounter of 07/29/19 (from the past 48 hour(s))  CBC with Differential     Status: Abnormal   Collection Time: 07/29/19 11:53 PM  Result Value Ref Range   WBC 8.7 4.0 - 10.5 K/uL   RBC 5.58 4.22 - 5.81 MIL/uL   Hemoglobin 14.2 13.0 - 17.0 g/dL   HCT 69.6 29.5 - 28.4 %   MCV 81.9 80.0 - 100.0 fL   MCH 25.4 (L) 26.0 - 34.0 pg   MCHC 31.1 30.0 - 36.0 g/dL   RDW 13.2 44.0 - 10.2 %   Platelets 352 150 - 400 K/uL   nRBC 0.0 0.0 - 0.2 %   Neutrophils Relative % 80 %   Neutro Abs 6.9 1.7 - 7.7 K/uL   Lymphocytes Relative 12 %   Lymphs Abs 1.0 0.7 - 4.0 K/uL   Monocytes Relative 7 %   Monocytes Absolute 0.6 0.1 - 1.0 K/uL   Eosinophils Relative 1 %   Eosinophils Absolute 0.1 0.0 - 0.5 K/uL   Basophils Relative 0 %   Basophils Absolute 0.0 0.0 - 0.1 K/uL   Immature Granulocytes 0 %   Abs Immature Granulocytes 0.02 0.00 - 0.07 K/uL    Comment: Performed at [facility], [address] [place], [place] [address]  Basic metabolic panel     Status: Abnormal   Collection Time: 07/29/19 11:53 PM  Result Value Ref Range   Sodium 137 135 - 145 mmol/L   Potassium 5.8 (H) 3.5 - 5.1 mmol/L   Chloride 101 98 - 111 mmol/L   CO2 27 22 - 32 mmol/L   Glucose, Bld 101 (H) 70 - 99 mg/dL    Comment: Glucose reference range applies only to samples taken after fasting for at least 8 hours.   BUN 8 6 - 20 mg/dL   Creatinine, Ser 6.44 (H) 0.61 - 1.24 mg/dL   Calcium 8.2 (L) 8.9 - 10.3 mg/dL   GFR calc non Af Amer >60 >60 mL/min   GFR calc Af Amer >60 >60 mL/min   Anion gap 9 5 - 15    Comment: Performed at [facility], [address], [place], [place] [address]  Potassium     Status: None   Collection Time: 07/30/19  1:29 AM  Result Value Ref Range   Potassium 4.0 3.5 - 5.1 mmol/L    Comment: DELTA CHECK NOTED Performed at [facility], [address], [place], [place] [address]   RPR  Status: Abnormal   Collection Time: 07/30/19  2:33 AM  Result Value Ref Range   RPR Ser Ql Reactive (A) NON REACTIVE    Comment: SENT FOR CONFIRMATION   RPR Titer 1:64     Comment: Performed at [facility], [address], [place], [place] [address]  Rapid urine drug screen (hospital performed)     Status: Abnormal   Collection Time: 07/30/19  2:33 AM  Result Value Ref Range   Opiates NONE DETECTED NONE DETECTED   Cocaine POSITIVE (A) NONE DETECTED   Benzodiazepines NONE DETECTED NONE DETECTED   Amphetamines NONE DETECTED NONE DETECTED   Tetrahydrocannabinol POSITIVE (A) NONE DETECTED   Barbiturates NONE DETECTED NONE DETECTED    Comment: (NOTE) DRUG SCREEN FOR MEDICAL PURPOSES ONLY.  IF CONFIRMATION IS NEEDED FOR ANY PURPOSE, NOTIFY LAB WITHIN 5 DAYS. LOWEST DETECTABLE LIMITS FOR URINE DRUG SCREEN Drug Class                     Cutoff (ng/mL) Amphetamine and metabolites    1000 Barbiturate and metabolites    200 Benzodiazepine                 200 Tricyclics and metabolites     300 Opiates and metabolites        300 Cocaine and metabolites        300 THC                            50 Performed at [facility], [address], [place], [place] [address]   Blood gas, venous (at [facility], not at [facility])     Status: Abnormal   Collection Time: 07/30/19  2:33 AM  Result Value Ref Range   pH, Ven 7.284 7.250 - 7.430   pCO2, Ven 61.9 (H) 44.0 - 60.0 mmHg   pO2, Ven 46.7 (H) 32.0 - 45.0 mmHg   Bicarbonate 28.4 (H) 20.0 - 28.0 mmol/L   Acid-Base Excess 0.5 0.0 - 2.0 mmol/L   O2 Saturation 75.5 %   Patient temperature 98.6     Comment: Performed at [facility], [address] [place], [place] [address]  Hepatic function panel     Status: Abnormal   Collection Time: 07/30/19  2:33 AM  Result Value Ref Range   Total Protein 7.1 6.5 - 8.1 g/dL   Albumin 3.3 (L) 3.5 - 5.0 g/dL   AST 27 15 - 41 U/L   ALT 14 0 - 44 U/L   Alkaline  Phosphatase 80 38 - 126 U/L   Total Bilirubin 0.7 0.3 - 1.2 mg/dL   Bilirubin, Direct 0.3 (H) 0.0 - 0.2 mg/dL   Indirect Bilirubin 0.4 0.3 - 0.9 mg/dL    Comment: Performed at [facility], [address], [place], [place] [address]  HIV Antibody (routine testing w rflx)     Status: None   Collection Time: 07/30/19  2:33 AM  Result Value Ref Range   HIV Screen 4th Generation wRfx NON REACTIVE NON REACTIVE    Comment: Performed at [facility], [address] [place] [address], [place], [place] [address]  Ethanol     Status: None   Collection Time: 07/30/19  2:38 AM  Result Value Ref Range   Alcohol, Ethyl (B) <10 <10 mg/dL    Comment: (NOTE) Lowest detectable limit for serum alcohol is 10 mg/dL. For medical purposes only.  Performed at [facility], [address] [place], [place] [address]   SARS CORONAVIRUS 2 (TAT 6-24 HRS) Nasopharyngeal Nasopharyngeal Swab     Status: None   Collection Time: 07/30/19  3:45 AM   Specimen: Nasopharyngeal Swab  Result Value Ref Range   SARS Coronavirus 2 NEGATIVE NEGATIVE    Comment: (NOTE) SARS-CoV-2 target nucleic acids are NOT DETECTED. The SARS-CoV-2 RNA is generally detectable in upper and lower respiratory specimens during the acute phase of infection. Negative results do not preclude SARS-CoV-2 infection, do not rule out co-infections with other pathogens, and should not be used as the sole basis for treatment or other patient management decisions. Negative results must be combined with clinical observations, patient history, and epidemiological information. The expected result is Negative. Fact Sheet for Patients: HairSlick.no Fact Sheet for Healthcare Providers: quierodirigir.com This test is not yet approved or cleared by the [place] FDA and  has been authorized for detection and/or diagnosis of SARS-CoV-2 by FDA under an Emergency Use  Authorization (EUA). This EUA will remain  in effect (meaning this test can be used) for the duration of the COVID-19 declaration under Section 56 4(b)(1) of the Act, 21 U.S.C. section 360bbb-3(b)(1), unless the authorization is terminated or revoked sooner. Performed at [facility], [address], [place], [place] [address]   CBC with Differential/Platelet     Status: Abnormal   Collection Time: 07/30/19  5:41 AM  Result Value Ref Range   WBC 15.2 (H) 4.0 - 10.5 K/uL   RBC 5.48 4.22 - 5.81 MIL/uL   Hemoglobin 14.0 13.0 - 17.0 g/dL   HCT 11.9 14.7 - 82.9 %   MCV 83.0 80.0 - 100.0 fL   MCH 25.5 (L) 26.0 - 34.0 pg   MCHC 30.8 30.0 - 36.0 g/dL   RDW 56.2 13.0 - 86.5 %   Platelets 279 150 - 400 K/uL   nRBC 0.0 0.0 - 0.2 %   Neutrophils Relative % 84 %   Neutro Abs 12.8 (H) 1.7 - 7.7 K/uL   Lymphocytes Relative 9 %   Lymphs Abs 1.3 0.7 - 4.0 K/uL   Monocytes Relative 6 %   Monocytes Absolute 0.9 0.1 - 1.0 K/uL   Eosinophils Relative 0 %   Eosinophils Absolute 0.0 0.0 - 0.5 K/uL   Basophils Relative 0 %   Basophils Absolute 0.0 0.0 - 0.1 K/uL   Immature Granulocytes 1 %   Abs Immature Granulocytes 0.16 (H) 0.00 - 0.07 K/uL    Comment: Performed at [facility], [address], [place], [place] [address]  Comprehensive metabolic panel     Status: Abnormal   Collection Time: 07/30/19  5:41 AM  Result Value Ref Range   Sodium 136 135 - 145 mmol/L   Potassium 3.9 3.5 - 5.1 mmol/L   Chloride 103 98 - 111 mmol/L   CO2 26 22 - 32 mmol/L   Glucose, Bld 106 (H) 70 - 99 mg/dL    Comment: Glucose reference range applies only to samples taken after fasting for at least 8 hours.   BUN 8 6 - 20 mg/dL   Creatinine, Ser 6.29 0.61 - 1.24 mg/dL   Calcium 8.0 (L) 8.9 - 10.3 mg/dL   Total Protein 6.9 6.5 - 8.1 g/dL   Albumin 3.1 (L) 3.5 - 5.0 g/dL   AST 24 15 - 41 U/L   ALT 15 0 - 44 U/L   Alkaline Phosphatase 74 38 - 126 U/L  Total Bilirubin 1.1 0.3 - 1.2 mg/dL     GFR calc non Af Amer >60 >60 mL/min   GFR calc Af Amer >60 >60 mL/min   Anion gap 7 5 - 15    Comment: Performed at [facility], [address] [place], [place] [address]  Procalcitonin - Baseline     Status: None   Collection Time: 07/30/19  5:41 AM  Result Value Ref Range   Procalcitonin 0.93 ng/mL    Comment:        Interpretation: PCT > 0.5 ng/mL and <= 2 ng/mL: Systemic infection (sepsis) is possible, but other conditions are known to elevate PCT as well. (NOTE)       Sepsis PCT Algorithm           Lower Respiratory Tract                                      Infection PCT Algorithm    ----------------------------     ----------------------------         PCT < 0.25 ng/mL                PCT < 0.10 ng/mL         Strongly encourage             Strongly discourage   discontinuation of antibiotics    initiation of antibiotics    ----------------------------     -----------------------------       PCT 0.25 - 0.50 ng/mL            PCT 0.10 - 0.25 ng/mL               OR       >80% decrease in PCT            Discourage initiation of                                            antibiotics      Encourage discontinuation           of antibiotics    ----------------------------     -----------------------------         PCT >= 0.50 ng/mL              PCT 0.26 - 0.50 ng/mL                AND       <80% decrease in PCT             Encourage initiation of                                             antibiotics       Encourage continuation           of antibiotics    ----------------------------     -----------------------------        PCT >= 0.50 ng/mL                  PCT > 0.50 ng/mL               AND  increase in PCT                  Strongly encourage                                      initiation of antibiotics    Strongly encourage escalation           of antibiotics                                     -----------------------------                                            PCT <= 0.25 ng/mL                                                 OR                                        > 80% decrease in PCT                                     Discontinue / Do not initiate                                             antibiotics Performed at [facility], [address] [place], [place] [address]       Component Value Date/Time   SDES CSF 11/04/2017 1259   SPECREQUEST NONE 11/04/2017 1259   CULT  11/04/2017 1259    NO GROWTH 3 DAYS Performed at [facility], [address] [place], [place] [address]    REPTSTATUS 11/07/2017 FINAL 11/04/2017 1259   DG Chest Port 1 View  Result Date: 07/30/2019 CLINICAL DATA:  24 year old male with overdose. Concern for aspiration. EXAM: PORTABLE CHEST 1 VIEW COMPARISON:  Chest radiograph dated 06/01/2019. FINDINGS: Left perihilar and infrahilar faint reticulonodular densities may represent atelectasis or aspiration. Clinical correlation is recommended. No lobar consolidation, pleural effusion or pneumothorax. The cardiac silhouette is within limits. No acute osseous pathology. IMPRESSION: Left perihilar and infrahilar atelectasis versus aspiration. Electronically Signed   By: Elgie Collard M.D.   On: 07/30/2019 00:12   Recent Results (from the past 240 hour(s))  SARS CORONAVIRUS 2 (TAT 6-24 HRS) Nasopharyngeal Nasopharyngeal Swab     Status: None   Collection Time: 07/30/19  3:45 AM   Specimen: Nasopharyngeal Swab  Result Value Ref Range Status   SARS Coronavirus 2 NEGATIVE NEGATIVE Final    Comment: (NOTE) SARS-CoV-2 target nucleic acids are NOT DETECTED. The SARS-CoV-2 RNA is generally detectable in upper and lower respiratory specimens during the acute phase of infection. Negative results do not preclude SARS-CoV-2 infection, do not rule out co-infections with other pathogens, and should not be  used as the sole basis for treatment or other patient management  decisions. Negative results must be combined with clinical observations, patient history, and epidemiological information. The expected result is Negative. Fact Sheet for Patients: SugarRoll.be Fact Sheet for Healthcare Providers: https://www.woods-mathews.com/ This test is not yet approved or cleared by the [place] FDA and  has been authorized for detection and/or diagnosis of SARS-CoV-2 by FDA under an Emergency Use Authorization (EUA). This EUA will remain  in effect (meaning this test can be used) for the duration of the COVID-19 declaration under Section 56 4(b)(1) of the Act, 21 U.S.C. section 360bbb-3(b)(1), unless the authorization is terminated or revoked sooner. Performed at [facility], [place] [address], [place], [place] [address]     Microbiology: Recent Results (from the past 240 hour(s))  SARS CORONAVIRUS 2 (TAT 6-24 HRS) Nasopharyngeal Nasopharyngeal Swab     Status: None   Collection Time: 07/30/19  3:45 AM   Specimen: Nasopharyngeal Swab  Result Value Ref Range Status   SARS Coronavirus 2 NEGATIVE NEGATIVE Final    Comment: (NOTE) SARS-CoV-2 target nucleic acids are NOT DETECTED. The SARS-CoV-2 RNA is generally detectable in upper and lower respiratory specimens during the acute phase of infection. Negative results do not preclude SARS-CoV-2 infection, do not rule out co-infections with other pathogens, and should not be used as the sole basis for treatment or other patient management decisions. Negative results must be combined with clinical observations, patient history, and epidemiological information. The expected result is Negative. Fact Sheet for Patients: SugarRoll.be Fact Sheet for Healthcare Providers: https://www.woods-mathews.com/ This test is not yet approved or cleared by the [place] FDA and  has been authorized for detection and/or diagnosis of  SARS-CoV-2 by FDA under an Emergency Use Authorization (EUA). This EUA will remain  in effect (meaning this test can be used) for the duration of the COVID-19 declaration under Section 56 4(b)(1) of the Act, 21 U.S.C. section 360bbb-3(b)(1), unless the authorization is terminated or revoked sooner. Performed at [facility], [place] [address], [place], [place] [address]     Radiographs and labs were personally reviewed by me.   Bobby Rumpf, MD [facility] [place] [facility] [phone] 07/30/2019, 4:51 PM

## 2019-07-30 NOTE — ED Notes (Signed)
Attempted to transition patient to nasal cannula, patient's oxygen dropped to 91% with 6 L. Patient also at the time presented with snoring respirations but easy to arouse. Placed patient back on non-rebreather and oxygen maintained at 96%.

## 2019-07-30 NOTE — Assessment & Plan Note (Signed)
IV zosyn to cover for aspiration.

## 2019-07-30 NOTE — H&P (Signed)
History and Physical    Alex Potter OEU:[id number] DOB: [birth year] DOA: 07/29/2019  PCP: Patient, No Pcp Per   Patient coming from: [facility]  I have personally briefly reviewed patient's old medical records in [place]  CC: overdose HPI: 24 year old male with a history of polysubstance abuse, multiple admissions in the past for drug overdose who presents via EMS secondary to overdose.  [facility] [place] [facility]  [place] department was called.  Patient was given 2 mg of intranasal Narcan.  He was arousable.  Reportedly people who are [facility] and called EMS due to the patient being unresponsive.  When I attempted to elicit more history from the patient, he turned over in bed and purposely rolled away from me.  He was resistant to answer any questions.  Patient was on a high flow face tent oxygen mask due to hypoxia.  ER provider started antibiotics due to possible aspiration pneumonia.  There is also concern about possible scabies versus syphilitic rash.  Patient be admitted ICU stepdown unit due to hypoxic respiratory failure.    ED Course: pt started on IVF and IV ABX. Started on supplemental O2.  Review of Systems:  Review of Systems  Unable to perform ROS: Mental acuity  pt still sedated from heroin overdose. Pt also refusing to answer questions.  Past Medical History:  Diagnosis Date  . Drug abuse ([place])   . Tobacco abuse     Past Surgical History:  Procedure Laterality Date  . CHOLECYSTECTOMY N/A 07/05/2017   Procedure: LAPAROSCOPIC CHOLECYSTECTOMY WITH INTRAOPERATIVE CHOLANGIOGRAM;  Surgeon: Judeth Horn, MD;  Location: [facility];  Service: General;  Laterality: N/A;  . ERCP N/A 07/04/2017   Procedure: ENDOSCOPIC RETROGRADE CHOLANGIOPANCREATOGRAPHY (ERCP);  Surgeon: Doran Stabler, MD;  Location: [facility];  Service: Gastroenterology;  Laterality: N/A;  . INGUINAL HERNIA REPAIR Right      reports that he has been  smoking. He has never used smokeless tobacco. He reports current alcohol use. He reports current drug use. Drugs: Marijuana and Methamphetamines.  Allergies  Allergen Reactions  . Mushroom Extract Complex Itching and Swelling    History reviewed. No pertinent family history.  Prior to Admission medications   Medication Sig Start Date End Date Taking? Authorizing Provider  guaiFENesin (MUCINEX) 600 MG 12 hr tablet Take 1 tablet (600 mg total) by mouth 2 (two) times daily as needed for cough or to loosen phlegm. Patient not taking: Reported on 11/28/2018 11/07/17   Bonnielee Haff, MD  nicotine (NICODERM CQ - DOSED IN MG/24 HOURS) 21 mg/24hr patch Place 1 patch (21 mg total) onto the skin daily. Patient not taking: Reported on 11/28/2018 11/07/17   Bonnielee Haff, MD    Physical Exam: Vitals:   07/30/19 0108 07/30/19 0200 07/30/19 0244 07/30/19 0320  BP: 118/82 138/81 139/88 134/90  Pulse: 89 71 (!) 130 98  Resp: (!) 36 (!) 41 (!) 34 (!) 44  SpO2: 94% 91% 94% 99%    Physical Exam  Nursing note and vitals reviewed. Constitutional:  Disheveled male. On O2 face shield   HENT:  Head: Normocephalic and atraumatic.  Eyes:  Pupils pinpoint but reactive to light  Neck: No JVD present.  Cardiovascular: Regular rhythm. Tachycardia present.  Respiratory: Tachypnea noted. He has decreased breath sounds. He has rales in the right lower field and the left lower field.  GI:  Resisted examination of abd. States "why are you touching me!?"  Neurological:  Sedated. Arouses for only 2-3 secs before going back to sleep.  Skin: He is diaphoretic.  Diffuse maculopapular rash over torso. No burrow sign in webspace in between his fingers.  Pt is diaphoretic     Labs on Admission: I have personally reviewed following labs and imaging studies  CBC: Recent Labs  Lab 07/29/19 2353  WBC 8.7  NEUTROABS 6.9  HGB 14.2  HCT 45.7  MCV 81.9  PLT 352   Basic Metabolic Panel: Recent Labs  Lab  07/29/19 2353 07/30/19 0129  NA 137  --   K 5.8* 4.0  CL 101  --   CO2 27  --   GLUCOSE 101*  --   BUN 8  --   CREATININE 1.25*  --   CALCIUM 8.2*  --    GFR: CrCl cannot be calculated (Unknown ideal weight.). Liver Function Tests: Recent Labs  Lab 07/30/19 0233  AST 27  ALT 14  ALKPHOS 80  BILITOT 0.7  PROT 7.1  ALBUMIN 3.3*   07/30/2019 3:04 AM - Interface, Lab In [facility] Value Flag Ref Range Units Status  Opiates NONE DETECTED   NONE DETECTED  Final  Cocaine POSITIVE  Abnormal   NONE DETECTED  Final  Benzodiazepines NONE DETECTED   NONE DETECTED  Final  Amphetamines NONE DETECTED   NONE DETECTED  Final  Tetrahydrocannabinol POSITIVE  Abnormal   NONE DETECTED  Final  Barbiturates NONE DETECTED   NONE DETECTED  Final     No results for input(s): LIPASE, AMYLASE in the last 168 hours. No results for input(s): AMMONIA in the last 168 hours. Coagulation Profile: No results for input(s): INR, PROTIME in the last 168 hours. Cardiac Enzymes: No results for input(s): CKTOTAL, CKMB, CKMBINDEX, TROPONINI in the last 168 hours. BNP (last 3 results) No results for input(s): PROBNP in the last 8760 hours. HbA1C: No results for input(s): HGBA1C in the last 72 hours. CBG: No results for input(s): GLUCAP in the last 168 hours. Lipid Profile: No results for input(s): CHOL, HDL, LDLCALC, TRIG, CHOLHDL, LDLDIRECT in the last 72 hours. Thyroid Function Tests: No results for input(s): TSH, T4TOTAL, FREET4, T3FREE, THYROIDAB in the last 72 hours. Anemia Panel: No results for input(s): VITAMINB12, FOLATE, FERRITIN, TIBC, IRON, RETICCTPCT in the last 72 hours. Urine analysis:    Component Value Date/Time   COLORURINE YELLOW 11/03/2017 1020   APPEARANCEUR CLEAR 11/03/2017 1020   LABSPEC 1.020 11/03/2017 1020   PHURINE 5.0 11/03/2017 1020   GLUCOSEU 50 (A) 11/03/2017 1020   HGBUR MODERATE (A) 11/03/2017 1020   BILIRUBINUR NEGATIVE 11/03/2017 1020   KETONESUR  NEGATIVE 11/03/2017 1020   PROTEINUR NEGATIVE 11/03/2017 1020   NITRITE NEGATIVE 11/03/2017 1020   LEUKOCYTESUR NEGATIVE 11/03/2017 1020    Radiological Exams on Admission: I have personally reviewed images DG Chest Port 1 View  Result Date: 07/30/2019 CLINICAL DATA:  24 year old male with overdose. Concern for aspiration. EXAM: PORTABLE CHEST 1 VIEW COMPARISON:  Chest radiograph dated 06/01/2019. FINDINGS: Left perihilar and infrahilar faint reticulonodular densities may represent atelectasis or aspiration. Clinical correlation is recommended. No lobar consolidation, pleural effusion or pneumothorax. The cardiac silhouette is within limits. No acute osseous pathology. IMPRESSION: Left perihilar and infrahilar atelectasis versus aspiration. Electronically Signed   By: Elgie Collard M.D.   On: 07/30/2019 00:12    EKG: None  Assessment/Plan Principal Problem:   Heroin overdose, accidental or unintentional, initial encounter [facility]) Active Problems:   Acute respiratory failure with hypoxia (HCC)  Aspiration pneumonitis (HCC)   Polysubstance abuse (HCC)   Diffuse papular rash - rule out syphilis vs scabies    Heroin overdose, accidental or unintentional, initial encounter (HCC) Admit to ICU stepdown unit. Prn narcan. Prn clonidine for heroin withdrawal. Pt had had multiple rehab stays per EMR. Appears to have been unsuccesful.  Acute respiratory failure with hypoxia (HCC) Continue with supplemental O2.  Aspiration pneumonitis (HCC) IV zosyn to cover for aspiration.  Polysubstance abuse (HCC) Pt's UDS also tested positive for cocaine. Avoid betablockers for now.  Diffuse papular rash - rule out syphilis vs scabies Unclear if rash is syphilitic vs scabies in nature. May need ID consult to evaluate his rash. Unsure if IV Zosyn being used for aspiration pneumonia will be enough if his RPR turns positive.  Awaiting RPR result.  ID consult would be beneficial as well to see if pt needs  LP.   DVT prophylaxis: Lovenox Code Status: Full Code by default Family Communication: no family present  Disposition Plan: DC after pt weaned to RA  Consults called: none  Admission status: Inpatient, Step Down Unit   Carollee Herter, DO [facility] 07/30/2019, 3:41 AM

## 2019-07-30 NOTE — Assessment & Plan Note (Signed)
Continue IVF. Repeat labs in AM.

## 2019-07-30 NOTE — Assessment & Plan Note (Signed)
Admit to ICU stepdown unit. Prn narcan. Prn clonidine for heroin withdrawal. Pt had had multiple rehab stays per EMR. Appears to have been unsuccesful.

## 2019-07-30 NOTE — Assessment & Plan Note (Signed)
Pt's UDS also tested positive for cocaine. Avoid betablockers for now.

## 2019-07-30 NOTE — Subjective & Objective (Signed)
CC: overdose HPI: 24 year old male with a history of polysubstance abuse, multiple admissions in the past for drug overdose who presents via EMS secondary to overdose.  [facility] [place] [facility]  [facility] department was called.  Patient was given 2 mg of intranasal Narcan.  He was arousable.  Reportedly people who are [place] [facility] [place] [facility] and called EMS due to the patient being unresponsive.  When I attempted to elicit more history from the patient, he turned over in bed and purposely rolled away from me.  He was resistant to answer any questions.  Patient was on a high flow face tent oxygen mask due to hypoxia.  ER provider started antibiotics due to possible aspiration pneumonia.  There is also concern about possible scabies versus syphilitic rash.  Patient be admitted ICU stepdown unit due to hypoxic respiratory failure.

## 2019-07-30 NOTE — Progress Notes (Signed)
PROGRESS NOTE  Brief Narrative: Alex Potter is a 24 y.o. male with a history of polysubstance abuse, recurrent overdoses who presented from a motel with reports of drug overdose, unresponsive. He was responsive to narcan, complaining of shortness of breath and stating he "partied too hard," didn't intentionally overdose. He was tachypneic and hypoxic, afebrile with notable rash. UDS positive for THC, cocaine, negative for opioids.  IV antibiotics given for concern of aspiration, RPR sent and patient admitted this morning by Dr. Imogene Burn. RPR has returned positive and mental status remains abnormal. LP is requested, though delayed by 24 hours due to lovenox administration.   Subjective: Short of breath, otherwise withdrawn and not volunteering history. Does confirm he snorted heroin last night, did some other drugs with friends as well. Does not particularly want to stop using drugs. Does not inject drugs. Concedes to having penile ulceration for about 2 weeks, sexually active with someone with the same remotely.   Objective: BP 130/66   Pulse 69   Resp (!) 24   Ht 5\' 7"  (1.702 m)   Wt 79.4 kg   SpO2 98%   BMI 27.41 kg/m   Gen: No acute distress Pulm: Clear and nonlabored, hypoxic requiring NRB.  CV: RRR, no appreciable murmur, no JVD, no edema GI: Soft, NT, ND, +BS  Neuro: Alert and oriented. No focal deficits. Skin: Diffuse macular rash on trunk, shallow painless ulceration on corona of penis. No excoriation with attention to flexor surface of wrists, no burrows noted in interdigital spaces.  Lymph: No tender inguinal lymphadenopathy  Assessment & Plan: Acute hypoxic respiratory failure, presumably due to aspiration pneumonitis and/or pneumonia due to drug overdose: - No evidence of withdrawal, but will monitor in SDU. Continue prn narcan.  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 - Hold lovenox in expectation of LP tomorrow morning. D/w radiology. Send for  studies including VDRL, RPR.  - ID consult appreciated. No PCN allergy.  Polysubstance abuse:  - Not interested in quitting. Will provide resources.  , MD Pager on amion 07/30/2019, 4:35 PM

## 2019-07-30 NOTE — Assessment & Plan Note (Signed)
Continue with supplemental O2 

## 2019-07-30 NOTE — ED Notes (Signed)
Admitting MD at bedside.   Pt was provided with water to drink due to pt stating he could not talk unless he had water.   Pt A&Ox4.

## 2019-07-30 NOTE — ED Notes (Signed)
Linens changed, pt ambulatory in room and used urinal. Pt is A&Ox4. Pt has no complaints at this time only wanting a Nicotine patch. Admitting MD paged and made aware.

## 2019-07-30 NOTE — Assessment & Plan Note (Signed)
Unclear if rash is syphilitic vs scabies in nature. May need ID consult to evaluate his rash. Unsure if IV Zosyn being used for aspiration pneumonia will be enough if his RPR turns positive.  Awaiting RPR result.  ID consult would be beneficial as well to see if pt needs LP.

## 2019-07-30 NOTE — ED Notes (Addendum)
Pt had episode of vomiting. Provider made aware

## 2019-07-30 NOTE — Progress Notes (Signed)
Pharmacy Antibiotic Note  Alex Potter is a 24 y.o. male admitted on 07/29/2019 heroin overdose and possible aspiration pneumonia.  Pharmacy has been consulted for Unasyn dosing.  Afebrile, Elevated WBC, CrCl >130ml/min  Plan: Unasyn 3gm IV q6h No dose adjustments anticipated.  Pharmacy will sign off & monitor peripherally via electronic surveillance software.  Please re-consult if needed.   Height: 5\' 7"  (170.2 cm) Weight: 175 lb (79.4 kg) IBW/kg (Calculated) : 66.1  No data recorded.  Recent Labs  Lab 07/29/19 2353 07/30/19 0541  WBC 8.7 15.2*  CREATININE 1.25* 0.85    Estimated Creatinine Clearance: 136.5 mL/min (by C-G formula based on SCr of 0.85 mg/dL).    Allergies  Allergen Reactions  . Mushroom Extract Complex Itching and Swelling    Antimicrobials this admission: 3/18 Zosyn >> 3/18 3/18 Unasyn >>  3/18 PCN G x1 for syphilis  Dose adjustments this admission:  Microbiology results:  Thank you for allowing pharmacy to be a part of this patient's care.  4/18 PharmD, BCPS 07/30/2019 5:17 PM

## 2019-07-31 ENCOUNTER — Inpatient Hospital Stay (HOSPITAL_COMMUNITY): Payer: BLUE CROSS/BLUE SHIELD

## 2019-07-31 DIAGNOSIS — R825 Elevated urine levels of drugs, medicaments and biological substances: Secondary | ICD-10-CM

## 2019-07-31 DIAGNOSIS — J690 Pneumonitis due to inhalation of food and vomit: Secondary | ICD-10-CM

## 2019-07-31 DIAGNOSIS — A539 Syphilis, unspecified: Secondary | ICD-10-CM

## 2019-07-31 DIAGNOSIS — R21 Rash and other nonspecific skin eruption: Secondary | ICD-10-CM

## 2019-07-31 DIAGNOSIS — J9601 Acute respiratory failure with hypoxia: Secondary | ICD-10-CM

## 2019-07-31 LAB — HEPATITIS PANEL, ACUTE
HCV Ab: NONREACTIVE
Hep A IgM: NONREACTIVE
Hep B C IgM: NONREACTIVE
Hepatitis B Surface Ag: NONREACTIVE

## 2019-07-31 LAB — COMPREHENSIVE METABOLIC PANEL
ALT: 14 U/L (ref 0–44)
AST: 18 U/L (ref 15–41)
Albumin: 3 g/dL — ABNORMAL LOW (ref 3.5–5.0)
Alkaline Phosphatase: 68 U/L (ref 38–126)
Anion gap: 12 (ref 5–15)
BUN: 10 mg/dL (ref 6–20)
CO2: 26 mmol/L (ref 22–32)
Calcium: 8.8 mg/dL — ABNORMAL LOW (ref 8.9–10.3)
Chloride: 101 mmol/L (ref 98–111)
Creatinine, Ser: 0.78 mg/dL (ref 0.61–1.24)
GFR calc Af Amer: >60 mL/min (ref >60)
GFR calc non Af Amer: >60 mL/min (ref >60)
Glucose, Bld: 82 mg/dL (ref 70–99)
Potassium: 3.9 mmol/L (ref 3.5–5.1)
Sodium: 139 mmol/L (ref 135–145)
Total Bilirubin: 1.5 mg/dL — ABNORMAL HIGH (ref 0.3–1.2)
Total Protein: 6.9 g/dL (ref 6.5–8.1)

## 2019-07-31 LAB — CBC
HCT: 43.6 % (ref 39.0–52.0)
Hemoglobin: 13.3 g/dL (ref 13.0–17.0)
MCH: 25.5 pg — ABNORMAL LOW (ref 26.0–34.0)
MCHC: 30.5 g/dL (ref 30.0–36.0)
MCV: 83.5 fL (ref 80.0–100.0)
Platelets: 250 10*3/uL (ref 150–400)
RBC: 5.22 MIL/uL (ref 4.22–5.81)
RDW: 14.3 % (ref 11.5–15.5)
WBC: 9.9 10*3/uL (ref 4.0–10.5)
nRBC: 0 % (ref 0.0–0.2)

## 2019-07-31 LAB — T.PALLIDUM AB, TOTAL: T Pallidum Abs: REACTIVE — AB

## 2019-07-31 LAB — PROCALCITONIN: Procalcitonin: 1.37 ng/mL

## 2019-07-31 MED ORDER — HEPATITIS B VAC RECOMBINANT 10 MCG/ML IJ SUSP
1.0000 mL | Freq: Once | INTRAMUSCULAR | Status: AC
  Administered 2019-08-01: 10 ug via INTRAMUSCULAR
  Filled 2019-07-31: qty 1

## 2019-07-31 MED ORDER — HEPATITIS A VACCINE 1440 EL U/ML IM SUSP
1.0000 mL | Freq: Once | INTRAMUSCULAR | Status: AC
  Administered 2019-08-01: 1440 [IU] via INTRAMUSCULAR
  Filled 2019-07-31: qty 1

## 2019-07-31 MED ORDER — HEPATITIS A VACCINE 1440 EL U/ML IM SUSP: 1.0000 mL | Freq: Once | INTRAMUSCULAR | Status: DC

## 2019-07-31 MED ORDER — HEPATITIS B VAC RECOMBINANT 10 MCG/0.5ML IJ SUSP
0.5000 mL | Freq: Once | INTRAMUSCULAR | Status: DC
  Filled 2019-07-31: qty 0.5

## 2019-07-31 MED ORDER — CHLORHEXIDINE GLUCONATE CLOTH 2 % EX PADS
6.0000 | MEDICATED_PAD | Freq: Every day | CUTANEOUS | Status: DC
  Administered 2019-07-31: 6 via TOPICAL

## 2019-07-31 MED ORDER — DOXEPIN HCL 10 MG PO CAPS
20.0000 mg | ORAL_CAPSULE | Freq: Every evening | ORAL | Status: DC | PRN
  Administered 2019-08-01: 20 mg via ORAL
  Filled 2019-07-31 (×2): qty 2

## 2019-07-31 NOTE — Plan of Care (Signed)
Patient resting in bed, wakes up to voice. Patient states that he is having trouble hearing like everything is "muffled". Explained we were going to do LP and CT of head today. Patient agreeable. Will continue to monitor.  Problem: Activity: Goal: Ability to tolerate increased activity will improve Outcome: Progressing   Problem: Clinical Measurements: Goal: Ability to maintain a body temperature in the normal range will improve Outcome: Progressing   Problem: Respiratory: Goal: Ability to maintain adequate ventilation will improve Outcome: Progressing Goal: Ability to maintain a clear airway will improve Outcome: Progressing

## 2019-07-31 NOTE — Evaluation (Signed)
Clinical/Bedside Swallow Evaluation Patient Details  Name: Alex Potter MRN: [id number] Date of Birth: [birth year]  Today's Date: 07/31/2019 Time: SLP Start Time (ACUTE ONLY): 38 SLP Stop Time (ACUTE ONLY): 1356 SLP Time Calculation (min) (ACUTE ONLY): 21 min  Past Medical History:  Past Medical History:  Diagnosis Date  . Drug abuse ([place])   . Tobacco abuse    Past Surgical History:  Past Surgical History:  Procedure Laterality Date  . CHOLECYSTECTOMY N/A 07/05/2017   Procedure: LAPAROSCOPIC CHOLECYSTECTOMY WITH INTRAOPERATIVE CHOLANGIOGRAM;  Surgeon: Judeth Horn, MD;  Location: [place];  Service: General;  Laterality: N/A;  . ERCP N/A 07/04/2017   Procedure: ENDOSCOPIC RETROGRADE CHOLANGIOPANCREATOGRAPHY (ERCP);  Surgeon: Doran Stabler, MD;  Location: [place];  Service: Gastroenterology;  Laterality: N/A;  . INGUINAL HERNIA REPAIR Right    HPI:  Pt is a 24 y.o. male with a history of polysubstance abuse, recurrent overdoses who presented from a motel with reports of drug overdose, unresponsive. He was responsive to narcan, complaining of shortness of breath and stating he "partied too hard," didn't intentionally overdose. He was tachypneic and hypoxic, afebrile with notable rash. UDS positive for THC, cocaine, negative for opioids.  IV antibiotics given for concern of aspiration per MD note.  CXR Left perihilar and infrahilar atelectasis versus aspiration.  Swallow evaluation ordered.   Assessment / Plan / Recommendation Clinical Impression  Functional oropharyngeal swallow ability noted.  NO focal cranial nerve deficits apparent. Pt with mild dysphonia likely due to vomiting and coughing.   No indication of aspiration with all po intake consumed including cracker, puree, water and juice.  He passed 3 ounce Yale water test.  Pt does complain of dyspnea and uses caution with po intake to compensate.  Recommend regular/thin diet. No SlP follow up indicated. SLP Visit Diagnosis:  Dysphagia, unspecified (R13.10)    Aspiration Risk  Mild aspiration risk    Diet Recommendation Regular;Thin liquid   Liquid Administration via: Cup;Straw Medication Administration: Whole meds with liquid Supervision: Patient able to self feed Compensations: Slow rate;Small sips/bites Postural Changes: Seated upright at 90 degrees;Remain upright for at least 30 minutes after po intake    Other  Recommendations Oral Care Recommendations: Oral care BID   Follow up Recommendations None      Frequency and Duration     n/a       Prognosis   n/a     Swallow Study   General Date of Onset: 07/31/19 HPI: Pt is a 24 y.o. male with a history of polysubstance abuse, recurrent overdoses who presented from a motel with reports of drug overdose, unresponsive. He was responsive to narcan, complaining of shortness of breath and stating he "partied too hard," didn't intentionally overdose. He was tachypneic and hypoxic, afebrile with notable rash. UDS positive for THC, cocaine, negative for opioids.  IV antibiotics given for concern of aspiration per MD note.  CXR Left perihilar and infrahilar atelectasis versus aspiration.  Swallow evaluation ordered. Type of Study: Bedside Swallow Evaluation Previous Swallow Assessment: none in system Diet Prior to this Study: NPO Temperature Spikes Noted: No Respiratory Status: Nasal cannula(4 liters) History of Recent Intubation: No Behavior/Cognition: Alert;Cooperative;Pleasant mood(decreased hearing ability noted, pt states everything sounds muffled) Oral Cavity Assessment: Within Functional Limits;Erythema(mild erythema posterior oral cavity, ? with edematous tonsils) Oral Care Completed by SLP: No Oral Cavity - Dentition: Other (Comment);Adequate natural dentition Vision: Functional for self-feeding Self-Feeding Abilities: Able to feed self Patient Positioning: Upright in bed Baseline Vocal Quality: Hoarse Volitional  Cough: Strong Volitional  Swallow: Able to elicit    Oral/Motor/Sensory Function Overall Oral Motor/Sensory Function: Within functional limits   Ice Chips Ice chips: Not tested   Thin Liquid Thin Liquid: Within functional limits Presentation: Cup;Self Fed    Nectar Thick Nectar Thick Liquid: Not tested   Honey Thick Honey Thick Liquid: Not tested   Puree Puree: Within functional limits Presentation: Self Fed;Spoon   Solid     Solid: Within functional limits Presentation: Self [place] July 07/31/2019,2:05 PM  Rolena Infante, MS [facility] SLP Acute Rehab Services Office [phone]'

## 2019-07-31 NOTE — Progress Notes (Addendum)
PROGRESS NOTE  Alex Potter  XBM:[id number] DOB: 13-Nov-1995 DOA: 07/29/2019 PCP: Patient, No Pcp Per   Brief Narrative: Alex Potter is a 24 y.o. male with a history of polysubstance abuse, recurrent overdoses who presented from a motel with reports of drug overdose, unresponsive. He was responsive to narcan, complaining of shortness of breath and stating he "partied too hard," didn't intentionally overdose. He was tachypneic and hypoxic, afebrile with notable rash. UDS positive for THC, cocaine, negative for opioids.  IV antibiotics given for concern of aspiration, RPR sent and patient admitted. RPR has returned positive and ID was consulted. IM penicillin was given per ID recommendations. He remains intermittently altered. CT head and subsequent LP are requested for RPR, VDRL testing. He remains hypoxemic and on antibiotic for aspiration.  Assessment & Plan: Principal Problem:   Heroin overdose, accidental or unintentional, initial encounter [facility]) Active Problems:   Polysubstance abuse (HCC)   Acute respiratory failure with hypoxia (HCC)   Aspiration pneumonitis (HCC)   Diffuse papular rash - rule out syphilis vs scabies   AKI (acute kidney injury) (HCC)   Heroin overdose (HCC)  Acute hypoxic respiratory failure, presumably due to aspiration pneumonitis and/or pneumonia due to drug overdose: - No evidence of withdrawal since admission. Stable hemodynamically and from a respiratory standpoint for transfer out of SDU. Continue prn narcan.  - Continue supplemental oxygen and unasyn. If not improving, repeat CXR.  Acute toxic encephalopathy:  - Mentation has not completely normalized. Pursuing neuro imaging and LP. 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 s/p PCN IM x1 07/30/2019. - LP requested for r/o neuro involvement. Ordered studies including VDRL, RPR. CT head pending.  - ID consult appreciated.  AKI:  - Resolved.  Polysubstance abuse:  -  Not interested in quitting. Will provide resources. CSW consulted. - Acute hepatitis panel, GC/Chl pending. HIV NR.  Tobacco use:  - Nicotine patch provided.   Hard of hearing: Unclear etiology.  - CT head, LP.  DVT prophylaxis: SCDs Code Status: Full Family Communication: None at bedside Disposition Plan: Patient from home, likely to return to that environment once hypoxemia improved and work up (including CT head, LP) is complete.  Consultants:   ID  Procedures:   LP requested  Antimicrobials:  Zosyn 3/17 - 3/18  Penicillin G 2.96M units IM x1 3/18  Unasyn 3/18 >>   Subjective: No pain, still short of breath even at rest. No headache, no appetite still. Having productive cough.   Objective: Vitals:   07/31/19 1000 07/31/19 1100 07/31/19 1200 07/31/19 1237  BP: (!) 146/81 125/72 (!) 123/53   Pulse: (!) 101 82 65   Resp: (!) 24 (!) 27 (!) 26   Temp:    98.8 F (37.1 C)  TempSrc:    Oral  SpO2: 97% 97% 95%   Weight:      Height:        Intake/Output Summary (Last 24 hours) at 07/31/2019 1309 Last data filed at 07/30/2019 1713 Gross per 24 hour  Intake 1000 ml  Output --  Net 1000 ml   Filed Weights   07/30/19 0459  Weight: 79.4 kg    Gen: Unkempt 23yo M in no acute distress.  HEENT: Poor dentition.  Pulm: Non-labored breathing supplemental oxygen. Clear to auscultation bilaterally.  CV: Regular rate and rhythm. No murmur, rub, or gallop. No JVD, no pedal edema. GI: Abdomen soft, no significant tenderness, non-distended, with normoactive bowel sounds. No organomegaly or masses  felt. Ext: Warm, no deformities Skin: No new rashes on visualized skin. Neuro: Alert and oriented. No focal neurological deficits. Psych: Judgement and insight appear normal. Mood & affect appropriate.   Data Reviewed: I have personally reviewed following labs and imaging studies  CBC: Recent Labs  Lab 07/29/19 2353 07/30/19 0541 07/31/19 0546  WBC 8.7 15.2* 9.9   NEUTROABS 6.9 12.8*  --   HGB 14.2 14.0 13.3  HCT 45.7 45.5 43.6  MCV 81.9 83.0 83.5  PLT 352 279 250   Basic Metabolic Panel: Recent Labs  Lab 07/29/19 2353 07/30/19 0129 07/30/19 0541 07/31/19 0546  NA 137  --  136 139  K 5.8* 4.0 3.9 3.9  CL 101  --  103 101  CO2 27  --  26 26  GLUCOSE 101*  --  106* 82  BUN 8  --  8 10  CREATININE 1.25*  --  0.85 0.78  CALCIUM 8.2*  --  8.0* 8.8*   GFR: Estimated Creatinine Clearance: 145 mL/min (by C-G formula based on SCr of 0.78 mg/dL). Liver Function Tests: Recent Labs  Lab 07/30/19 0233 07/30/19 0541 07/31/19 0546  AST 27 24 18   ALT 14 15 14   ALKPHOS 80 74 68  BILITOT 0.7 1.1 1.5*  PROT 7.1 6.9 6.9  ALBUMIN 3.3* 3.1* 3.0*   No results for input(s): LIPASE, AMYLASE in the last 168 hours. No results for input(s): AMMONIA in the last 168 hours. Coagulation Profile: No results for input(s): INR, PROTIME in the last 168 hours. Cardiac Enzymes: No results for input(s): CKTOTAL, CKMB, CKMBINDEX, TROPONINI in the last 168 hours. BNP (last 3 results) No results for input(s): PROBNP in the last 8760 hours. HbA1C: No results for input(s): HGBA1C in the last 72 hours. CBG: No results for input(s): GLUCAP in the last 168 hours. Lipid Profile: No results for input(s): CHOL, HDL, LDLCALC, TRIG, CHOLHDL, LDLDIRECT in the last 72 hours. Thyroid Function Tests: No results for input(s): TSH, T4TOTAL, FREET4, T3FREE, THYROIDAB in the last 72 hours. Anemia Panel: No results for input(s): VITAMINB12, FOLATE, FERRITIN, TIBC, IRON, RETICCTPCT in the last 72 hours. Urine analysis:    Component Value Date/Time   COLORURINE YELLOW 11/03/2017 1020   APPEARANCEUR CLEAR 11/03/2017 1020   LABSPEC 1.020 11/03/2017 1020   PHURINE 5.0 11/03/2017 1020   GLUCOSEU 50 (A) 11/03/2017 1020   HGBUR MODERATE (A) 11/03/2017 1020   BILIRUBINUR NEGATIVE 11/03/2017 1020   KETONESUR NEGATIVE 11/03/2017 1020   PROTEINUR NEGATIVE 11/03/2017 1020    NITRITE NEGATIVE 11/03/2017 1020   LEUKOCYTESUR NEGATIVE 11/03/2017 1020   Recent Results (from the past 240 hour(s))  SARS CORONAVIRUS 2 (TAT 6-24 HRS) Nasopharyngeal Nasopharyngeal Swab     Status: None   Collection Time: 07/30/19  3:45 AM   Specimen: Nasopharyngeal Swab  Result Value Ref Range Status   SARS Coronavirus 2 NEGATIVE NEGATIVE Final    Comment: (NOTE) SARS-CoV-2 target nucleic acids are NOT DETECTED. The SARS-CoV-2 RNA is generally detectable in upper and lower respiratory specimens during the acute phase of infection. Negative results do not preclude SARS-CoV-2 infection, do not rule out co-infections with other pathogens, and should not be used as the sole basis for treatment or other patient management decisions. Negative results must be combined with clinical observations, patient history, and epidemiological information. The expected result is Negative. Fact Sheet for Patients: 11/05/2017 Fact Sheet for Healthcare Providers: 08/01/19 This test is not yet approved or cleared by the [place] FDA and  has  been authorized for detection and/or diagnosis of SARS-CoV-2 by FDA under an Emergency Use Authorization (EUA). This EUA will remain  in effect (meaning this test can be used) for the duration of the COVID-19 declaration under Section 56 4(b)(1) of the Act, 21 U.S.C. section 360bbb-3(b)(1), unless the authorization is terminated or revoked sooner. Performed at [facility], [place] [address] [place], [place] [address]       Radiology Studies: DG Chest [place] 1 View  Result Date: 07/30/2019 CLINICAL DATA:  24 year old male with overdose. Concern for aspiration. EXAM: PORTABLE CHEST 1 VIEW COMPARISON:  Chest radiograph dated 06/01/2019. FINDINGS: Left perihilar and infrahilar faint reticulonodular densities may represent atelectasis or aspiration. Clinical correlation is recommended. No lobar  consolidation, pleural effusion or pneumothorax. The cardiac silhouette is within limits. No acute osseous pathology. IMPRESSION: Left perihilar and infrahilar atelectasis versus aspiration. Electronically Signed   By: Anner Crete M.D.   On: 07/30/2019 00:12    Scheduled Meds: . Chlorhexidine Gluconate Cloth  6 each Topical Daily  . nicotine  21 mg Transdermal Daily   Continuous Infusions: . ampicillin-sulbactam (UNASYN) IV Stopped (07/31/19 0928)     LOS: 1 day   Time spent: 35 minutes.  Patrecia Pour, MD [facility] [email or web address] 07/31/2019, 1:09 PM

## 2019-07-31 NOTE — ED Notes (Signed)
ED TO INPATIENT HANDOFF REPORT  Name/Age/Gender Alex Potter 24 y.o. male  Code Status    Code Status Orders  (From admission, onward)         Start     Ordered   07/30/19 0438  Full code  Continuous     07/30/19 0437        Code Status History    Date Active Date Inactive Code Status Order ID Comments User Context   11/03/2017 1937 11/07/2017 1738 Full Code [id number]  Tobey Grim, NP Inpatient   11/03/2017 1609 11/03/2017 1937 Full Code [id number]  Noralee Stain, DO Inpatient   07/03/2017 2327 07/09/2017 1842 Full Code [id number]  Lorretta Harp, MD ED   Advance Care Planning Activity      Home/SNF/Other Home  Chief Complaint Heroin overdose [facility]) [T40.1X1A]  Level of Care/Admitting Diagnosis ED Disposition    ED Disposition Condition Comment   Admit  Hospital Area: [facility] [[id number]]  Level of Care: Stepdown [14]  Admit to SDU based on following criteria: Respiratory Distress:  Frequent assessment and/or intervention to maintain adequate ventilation/respiration, pulmonary toilet, and respiratory treatment.  Covid Evaluation: Asymptomatic Screening Protocol (No Symptoms)  Diagnosis: Heroin overdose [facility]) [[id number]]  Admitting Physician: Imogene Burn ERIC [[id number]]  Attending Physician: Imogene Burn, ERIC [[id number]]  Estimated length of stay: 3 - 4 days  Certification:: I certify this patient will need inpatient services for at least 2 midnights       Medical History Past Medical History:  Diagnosis Date  . Drug abuse (HCC)   . Tobacco abuse     Allergies Allergies  Allergen Reactions  . Mushroom Extract Complex Itching and Swelling    IV Location/Drains/Wounds Patient Lines/Drains/Airways Status   Active Line/Drains/Airways    Name:   Placement date:   Placement time:   Site:   Days:   Peripheral IV 07/29/19 Left Hand   07/29/19    2345    Hand   2          Labs/Imaging Results for orders placed or performed during the hospital encounter of  07/29/19 (from the past 48 hour(s))  CBC with Differential     Status: Abnormal   Collection Time: 07/29/19 11:53 PM  Result Value Ref Range   WBC 8.7 4.0 - 10.5 K/uL   RBC 5.58 4.22 - 5.81 MIL/uL   Hemoglobin 14.2 13.0 - 17.0 g/dL   HCT 41.3 24.4 - 01.0 %   MCV 81.9 80.0 - 100.0 fL   MCH 25.4 (L) 26.0 - 34.0 pg   MCHC 31.1 30.0 - 36.0 g/dL   RDW 27.2 53.6 - 64.4 %   Platelets 352 150 - 400 K/uL   nRBC 0.0 0.0 - 0.2 %   Neutrophils Relative % 80 %   Neutro Abs 6.9 1.7 - 7.7 K/uL   Lymphocytes Relative 12 %   Lymphs Abs 1.0 0.7 - 4.0 K/uL   Monocytes Relative 7 %   Monocytes Absolute 0.6 0.1 - 1.0 K/uL   Eosinophils Relative 1 %   Eosinophils Absolute 0.1 0.0 - 0.5 K/uL   Basophils Relative 0 %   Basophils Absolute 0.0 0.0 - 0.1 K/uL   Immature Granulocytes 0 %   Abs Immature Granulocytes 0.02 0.00 - 0.07 K/uL    Comment: Performed at [facility], [address] [place], [place] [address]  Basic metabolic panel     Status: Abnormal   Collection Time: 07/29/19 11:53 PM  Result Value Ref Range  Sodium 137 135 - 145 mmol/L   Potassium 5.8 (H) 3.5 - 5.1 mmol/L   Chloride 101 98 - 111 mmol/L   CO2 27 22 - 32 mmol/L   Glucose, Bld 101 (H) 70 - 99 mg/dL    Comment: Glucose reference range applies only to samples taken after fasting for at least 8 hours.   BUN 8 6 - 20 mg/dL   Creatinine, Ser 1.61 (H) 0.61 - 1.24 mg/dL   Calcium 8.2 (L) 8.9 - 10.3 mg/dL   GFR calc non Af Amer >60 >60 mL/min   GFR calc Af Amer >60 >60 mL/min   Anion gap 9 5 - 15    Comment: Performed at [facility], [address], [place], [place] [address]  Potassium     Status: None   Collection Time: 07/30/19  1:29 AM  Result Value Ref Range   Potassium 4.0 3.5 - 5.1 mmol/L    Comment: DELTA CHECK NOTED Performed at [facility] [address], [place], [place] [address]   RPR     Status: Abnormal   Collection Time: 07/30/19  2:33 AM  Result  Value Ref Range   RPR Ser Ql Reactive (A) NON REACTIVE    Comment: SENT FOR CONFIRMATION   RPR Titer 1:64     Comment: Performed at [facility], [address] [place], [place] [address]  Rapid urine drug screen (hospital performed)     Status: Abnormal   Collection Time: 07/30/19  2:33 AM  Result Value Ref Range   Opiates NONE DETECTED NONE DETECTED   Cocaine POSITIVE (A) NONE DETECTED   Benzodiazepines NONE DETECTED NONE DETECTED   Amphetamines NONE DETECTED NONE DETECTED   Tetrahydrocannabinol POSITIVE (A) NONE DETECTED   Barbiturates NONE DETECTED NONE DETECTED    Comment: (NOTE) DRUG SCREEN FOR MEDICAL PURPOSES ONLY.  IF CONFIRMATION IS NEEDED FOR ANY PURPOSE, NOTIFY LAB WITHIN 5 DAYS. LOWEST DETECTABLE LIMITS FOR URINE DRUG SCREEN Drug Class                     Cutoff (ng/mL) Amphetamine and metabolites    1000 Barbiturate and metabolites    200 Benzodiazepine                 200 Tricyclics and metabolites     300 Opiates and metabolites        300 Cocaine and metabolites        300 THC                            50 Performed at [facility], [address], [place], [place] [address]   Blood gas, venous (at [facility], not at [facility])     Status: Abnormal   Collection Time: 07/30/19  2:33 AM  Result Value Ref Range   pH, Ven 7.284 7.250 - 7.430   pCO2, Ven 61.9 (H) 44.0 - 60.0 mmHg   pO2, Ven 46.7 (H) 32.0 - 45.0 mmHg   Bicarbonate 28.4 (H) 20.0 - 28.0 mmol/L   Acid-Base Excess 0.5 0.0 - 2.0 mmol/L   O2 Saturation 75.5 %   Patient temperature 98.6     Comment: Performed at [facility], [address] [place], [place] [address]  Hepatic function panel     Status: Abnormal   Collection Time: 07/30/19  2:33 AM  Result Value Ref Range  Total Protein 7.1 6.5 - 8.1 g/dL   Albumin 3.3 (L) 3.5 - 5.0 g/dL   AST 27 15 - 41 U/L   ALT 14 0 - 44 U/L   Alkaline Phosphatase 80 38 - 126 U/L   Total Bilirubin 0.7 0.3 - 1.2 mg/dL    Bilirubin, Direct 0.3 (H) 0.0 - 0.2 mg/dL   Indirect Bilirubin 0.4 0.3 - 0.9 mg/dL    Comment: Performed at [facility], [place] [address], [place], [place] [address]  HIV Antibody (routine testing w rflx)     Status: None   Collection Time: 07/30/19  2:33 AM  Result Value Ref Range   HIV Screen 4th Generation wRfx NON REACTIVE NON REACTIVE    Comment: Performed at [place] [address] [place], [place] [address]  Ethanol     Status: None   Collection Time: 07/30/19  2:38 AM  Result Value Ref Range   Alcohol, Ethyl (B) <10 <10 mg/dL    Comment: (NOTE) Lowest detectable limit for serum alcohol is 10 mg/dL. For medical purposes only. Performed at [facility], [place] [address] [place], [place] [address]   SARS CORONAVIRUS 2 (TAT 6-24 HRS) Nasopharyngeal Nasopharyngeal Swab     Status: None   Collection Time: 07/30/19  3:45 AM   Specimen: Nasopharyngeal Swab  Result Value Ref Range   SARS Coronavirus 2 NEGATIVE NEGATIVE    Comment: (NOTE) SARS-CoV-2 target nucleic acids are NOT DETECTED. The SARS-CoV-2 RNA is generally detectable in upper and lower respiratory specimens during the acute phase of infection. Negative results do not preclude SARS-CoV-2 infection, do not rule out co-infections with other pathogens, and should not be used as the sole basis for treatment or other patient management decisions. Negative results must be combined with clinical observations, patient history, and epidemiological information. The expected result is Negative. Fact Sheet for Patients: [employer] Fact Sheet for Healthcare Providers: https://www.woods-mathews.com/ This test is not yet approved or cleared by the [place] FDA and  has been authorized for detection and/or diagnosis of SARS-CoV-2 by FDA under an Emergency Use Authorization (EUA). This EUA will remain  in effect (meaning this test can be  used) for the duration of the COVID-19 declaration under Section 56 4(b)(1) of the Act, 21 U.S.C. section 360bbb-3(b)(1), unless the authorization is terminated or revoked sooner. Performed at [facility], [place] [address], [place], [place] [address]   CBC with Differential/Platelet     Status: Abnormal   Collection Time: 07/30/19  5:41 AM  Result Value Ref Range   WBC 15.2 (H) 4.0 - 10.5 K/uL   RBC 5.48 4.22 - 5.81 MIL/uL   Hemoglobin 14.0 13.0 - 17.0 g/dL   HCT 45.5 39.0 - 52.0 %   MCV 83.0 80.0 - 100.0 fL   MCH 25.5 (L) 26.0 - 34.0 pg   MCHC 30.8 30.0 - 36.0 g/dL   RDW 14.3 11.5 - 15.5 %   Platelets 279 150 - 400 K/uL   nRBC 0.0 0.0 - 0.2 %   Neutrophils Relative % 84 %   Neutro Abs 12.8 (H) 1.7 - 7.7 K/uL   Lymphocytes Relative 9 %   Lymphs Abs 1.3 0.7 - 4.0 K/uL   Monocytes Relative 6 %   Monocytes Absolute 0.9 0.1 - 1.0 K/uL   Eosinophils Relative 0 %   Eosinophils Absolute 0.0 0.0 - 0.5 K/uL   Basophils Relative 0 %   Basophils Absolute 0.0 0.0 - 0.1  K/uL   Immature Granulocytes 1 %   Abs Immature Granulocytes 0.16 (H) 0.00 - 0.07 K/uL    Comment: Performed at [facility], [address], [place], [place] [address]  Comprehensive metabolic panel     Status: Abnormal   Collection Time: 07/30/19  5:41 AM  Result Value Ref Range   Sodium 136 135 - 145 mmol/L   Potassium 3.9 3.5 - 5.1 mmol/L   Chloride 103 98 - 111 mmol/L   CO2 26 22 - 32 mmol/L   Glucose, Bld 106 (H) 70 - 99 mg/dL    Comment: Glucose reference range applies only to samples taken after fasting for at least 8 hours.   BUN 8 6 - 20 mg/dL   Creatinine, Ser 0.93 0.61 - 1.24 mg/dL   Calcium 8.0 (L) 8.9 - 10.3 mg/dL   Total Protein 6.9 6.5 - 8.1 g/dL   Albumin 3.1 (L) 3.5 - 5.0 g/dL   AST 24 15 - 41 U/L   ALT 15 0 - 44 U/L   Alkaline Phosphatase 74 38 - 126 U/L   Total Bilirubin 1.1 0.3 - 1.2 mg/dL   GFR calc non Af Amer >60 >60 mL/min   GFR calc Af Amer >60 >60 mL/min    Anion gap 7 5 - 15    Comment: Performed at [facility], [address], [place], [place] [address]  Procalcitonin - Baseline     Status: None   Collection Time: 07/30/19  5:41 AM  Result Value Ref Range   Procalcitonin 0.93 ng/mL    Comment:        Interpretation: PCT > 0.5 ng/mL and <= 2 ng/mL: Systemic infection (sepsis) is possible, but other conditions are known to elevate PCT as well. (NOTE)       Sepsis PCT Algorithm           Lower Respiratory Tract                                      Infection PCT Algorithm    ----------------------------     ----------------------------         PCT < 0.25 ng/mL                PCT < 0.10 ng/mL         Strongly encourage             Strongly discourage   discontinuation of antibiotics    initiation of antibiotics    ----------------------------     -----------------------------       PCT 0.25 - 0.50 ng/mL            PCT 0.10 - 0.25 ng/mL               OR       >80% decrease in PCT            Discourage initiation of                                            antibiotics      Encourage discontinuation           of antibiotics    ----------------------------     -----------------------------         PCT >=  0.50 ng/mL              PCT 0.26 - 0.50 ng/mL                AND       <80% decrease in PCT             Encourage initiation of                                             antibiotics       Encourage continuation           of antibiotics    ----------------------------     -----------------------------        PCT >= 0.50 ng/mL                  PCT > 0.50 ng/mL               AND         increase in PCT                  Strongly encourage                                      initiation of antibiotics    Strongly encourage escalation           of antibiotics                                     -----------------------------                                           PCT <= 0.25 ng/mL                                                  OR                                        > 80% decrease in PCT                                     Discontinue / Do not initiate                                             antibiotics Performed at [facility], [address] [place], [place] [id number]    DG Chest Port 1 View  Result Date: 07/30/2019 CLINICAL DATA:  24 year old male with overdose. Concern for aspiration. EXAM: PORTABLE CHEST 1 VIEW COMPARISON:  Chest radiograph dated 06/01/2019. FINDINGS: Left perihilar and infrahilar faint reticulonodular densities may represent atelectasis or aspiration. Clinical correlation is recommended. No lobar consolidation, pleural  effusion or pneumothorax. The cardiac silhouette is within limits. No acute osseous pathology. IMPRESSION: Left perihilar and infrahilar atelectasis versus aspiration. Electronically Signed   By: Elgie Collard M.D.   On: 07/30/2019 00:12    Pending Labs Unresulted Labs (From admission, onward)    Start     Ordered   07/31/19 0500  Procalcitonin  Daily,   R     07/30/19 0437   07/31/19 0500  CBC  Tomorrow morning,   R     07/30/19 1650   07/31/19 0500  Comprehensive metabolic panel  Tomorrow morning,   R     07/30/19 1650   07/30/19 1800  Hepatitis panel, acute  Once,   R     07/30/19 1800   07/30/19 0233  T.pallidum Ab, Total  Once,   STAT     07/30/19 0233          Vitals/Pain Today's Vitals   07/31/19 0100 07/31/19 0130 07/31/19 0200 07/31/19 0230  BP: 126/75 135/77 131/80 131/72  Pulse: 82 77 75 80  Resp: 20 (!) 22 (!) 22 (!) 22  Temp:      TempSrc:      SpO2: 97% 97% 96% 99%  Weight:      Height:      PainSc:        Isolation Precautions Contact precautions  Medications Medications  naloxone (NARCAN) injection 0.4 mg (has no administration in time range)  ipratropium-albuterol (DUONEB) 0.5-2.5 (3) MG/3ML nebulizer solution 3 mL (has no administration in time range)  lactated ringers infusion (  Intravenous Stopped 07/30/19 1713)  cloNIDine (CATAPRES) tablet 0.1 mg (has no administration in time range)  acetaminophen (TYLENOL) tablet 650 mg (has no administration in time range)    Or  acetaminophen (TYLENOL) suppository 650 mg (has no administration in time range)  ondansetron (ZOFRAN) tablet 4 mg (has no administration in time range)    Or  ondansetron (ZOFRAN) injection 4 mg (has no administration in time range)  senna-docusate (Senokot-S) tablet 1 tablet (has no administration in time range)  Ampicillin-Sulbactam (UNASYN) 3 g in sodium chloride 0.9 % 100 mL IVPB (0 g Intravenous Stopped 07/30/19 1904)  nicotine (NICODERM CQ - dosed in mg/24 hours) patch 21 mg (21 mg Transdermal Patch Applied 07/30/19 2009)  ondansetron (ZOFRAN) injection 4 mg (4 mg Intravenous Given 07/29/19 2353)  naloxone [facility]) injection 0.4 mg (0.4 mg Intravenous Given 07/29/19 2355)  sodium chloride 0.9 % bolus 500 mL (0 mLs Intravenous Stopped 07/30/19 0117)  naloxone (NARCAN) injection 1 mg (1 mg Intravenous Given 07/30/19 0013)  famotidine (PEPCID) IVPB 20 mg premix (0 mg Intravenous Stopped 07/30/19 0117)  promethazine (PHENERGAN) injection 12.5 mg (12.5 mg Intravenous Given 07/30/19 0034)  sodium chloride 0.9 % bolus 500 mL (0 mLs Intravenous Stopped 07/30/19 0342)  ondansetron (ZOFRAN) injection 4 mg (4 mg Intravenous Given 07/30/19 0241)  naloxone (NARCAN) injection 1 mg (1 mg Intravenous Given 07/30/19 0241)  penicillin g benzathine (BICILLIN LA) 1200000 UNIT/2ML injection 2.4 Million Units (2.4 Million Units Intramuscular Given 07/30/19 1814)    Mobility walks

## 2019-07-31 NOTE — Plan of Care (Signed)
3L [place] 

## 2019-07-31 NOTE — Progress Notes (Signed)
INFECTIOUS DISEASE PROGRESS NOTE  ID: Alex Potter is a 24 y.o. male with  Principal Problem:   Heroin overdose, accidental or unintentional, initial encounter (HCC) Active Problems:   Polysubstance abuse (HCC)   Acute respiratory failure with hypoxia (HCC)   Aspiration pneumonitis (HCC)   Diffuse papular rash - rule out syphilis vs scabies   AKI (acute kidney injury) (HCC)   Heroin overdose (HCC)  Subjective: Denies rash. Having some SOB.   Abtx:  Anti-infectives (From admission, onward)   Start     Dose/Rate Route Frequency Ordered Stop   07/30/19 2000  piperacillin-tazobactam (ZOSYN) IVPB 3.375 g  Status:  Discontinued     3.375 g 12.5 mL/hr over 240 Minutes Intravenous Every 8 hours 07/30/19 1241 07/30/19 1704   07/30/19 1800  Ampicillin-Sulbactam (UNASYN) 3 g in sodium chloride 0.9 % 100 mL IVPB     3 g 200 mL/hr over 30 Minutes Intravenous Every 6 hours 07/30/19 1717     07/30/19 1715  penicillin g benzathine (BICILLIN LA) 1200000 UNIT/2ML injection 2.4 Million Units     2.4 Million Units Intramuscular  Once 07/30/19 1704 07/30/19 1814   07/30/19 0600  piperacillin-tazobactam (ZOSYN) IVPB 3.375 g  Status:  Discontinued     3.375 g 100 mL/hr over 30 Minutes Intravenous Every 6 hours 07/30/19 0437 07/30/19 1239      Medications:  Scheduled: . Chlorhexidine Gluconate Cloth  6 each Topical Daily  . nicotine  21 mg Transdermal Daily    Objective: Vital signs in last 24 hours: Temp:  [98 F (36.7 C)-99.1 F (37.3 C)] 98.8 F (37.1 C) (03/19 1237) Pulse Rate:  [63-101] 78 (03/19 1400) Resp:  [18-29] 29 (03/19 1400) BP: (110-146)/(53-93) 145/74 (03/19 1400) SpO2:  [92 %-100 %] 97 % (03/19 1400)   General appearance: alert, cooperative and no distress Resp: rhonchi anterior - bilateral and tachypnea Cardio: regularly irregular rhythm GI: normal findings: bowel sounds normal and soft, non-tender Extremities: edema none. rash resolved.   Lab Results Recent Labs     07/30/19 0541 07/31/19 0546  WBC 15.2* 9.9  HGB 14.0 13.3  HCT 45.5 43.6  NA 136 139  K 3.9 3.9  CL 103 101  CO2 26 26  BUN 8 10  CREATININE 0.85 0.78   Liver Panel Recent Labs    07/30/19 0233 07/30/19 0233 07/30/19 0541 07/31/19 0546  PROT 7.1   < > 6.9 6.9  ALBUMIN 3.3*   < > 3.1* 3.0*  AST 27   < > 24 18  ALT 14   < > 15 14  ALKPHOS 80   < > 74 68  BILITOT 0.7   < > 1.1 1.5*  BILIDIR 0.3*  --   --   --   IBILI 0.4  --   --   --    < > = values in this interval not displayed.   Sedimentation Rate No results for input(s): ESRSEDRATE in the last 72 hours. C-Reactive Protein No results for input(s): CRP in the last 72 hours.  Microbiology: Recent Results (from the past 240 hour(s))  SARS CORONAVIRUS 2 (TAT 6-24 HRS) Nasopharyngeal Nasopharyngeal Swab     Status: None   Collection Time: 07/30/19  3:45 AM   Specimen: Nasopharyngeal Swab  Result Value Ref Range Status   SARS Coronavirus 2 NEGATIVE NEGATIVE Final    Comment: (NOTE) SARS-CoV-2 target nucleic acids are NOT DETECTED. The SARS-CoV-2 RNA is generally detectable in upper and lower respiratory specimens during  the acute phase of infection. Negative results do not preclude SARS-CoV-2 infection, do not rule out co-infections with other pathogens, and should not be used as the sole basis for treatment or other patient management decisions. Negative results must be combined with clinical observations, patient history, and epidemiological information. The expected result is Negative. Fact Sheet for Patients: SugarRoll.be Fact Sheet for Healthcare Providers: https://www.woods-mathews.com/ This test is not yet approved or cleared by the [place] FDA and  has been authorized for detection and/or diagnosis of SARS-CoV-2 by FDA under an Emergency Use Authorization (EUA). This EUA will remain  in effect (meaning this test can be used) for the duration of  the COVID-19 declaration under Section 56 4(b)(1) of the Act, 21 U.S.C. section 360bbb-3(b)(1), unless the authorization is terminated or revoked sooner. Performed at [facility], [place] [address], [place], [place] [address]     Studies/Results: DG Chest Port 1 View  Result Date: 07/30/2019 CLINICAL DATA:  24 year old male with overdose. Concern for aspiration. EXAM: PORTABLE CHEST 1 VIEW COMPARISON:  Chest radiograph dated 06/01/2019. FINDINGS: Left perihilar and infrahilar faint reticulonodular densities may represent atelectasis or aspiration. Clinical correlation is recommended. No lobar consolidation, pleural effusion or pneumothorax. The cardiac silhouette is within limits. No acute osseous pathology. IMPRESSION: Left perihilar and infrahilar atelectasis versus aspiration. Electronically Signed   By: Anner Crete M.D.   On: 07/30/2019 00:12     Assessment/Plan: Polysubstance abuse Heroin OD?             UDS+ cocaine and THC Syphilis, secondary AKI (1.25 --> 0.85)  Total days of antibiotics: 1 unasyn  Start hep A and B vaccine series. He agrees to this.  Continue unasyn (or augmentin) to complete 8 days.  Check urine gc/chlamydia Available as needed.          Bobby Rumpf MD, FACP Infectious Diseases (pager) [phone] [email or web address] 07/31/2019, 2:37 PM  LOS: 1 day

## 2019-07-31 NOTE — Progress Notes (Signed)
Awaiting IV team assistance with re establishing IV access. Pt is requesting Melatoniin , Amion text page has been sent.

## 2019-08-01 DIAGNOSIS — A539 Syphilis, unspecified: Secondary | ICD-10-CM

## 2019-08-01 LAB — GC/CHLAMYDIA PROBE AMP (~~LOC~~) NOT AT ARMC
Chlamydia: NEGATIVE
Neisseria Gonorrhea: NEGATIVE

## 2019-08-01 LAB — CBC WITH DIFFERENTIAL/PLATELET
Abs Immature Granulocytes: 0.02 10*3/uL (ref 0.00–0.07)
Basophils Absolute: 0 10*3/uL (ref 0.0–0.1)
Basophils Relative: 1 %
Eosinophils Absolute: 0.4 10*3/uL (ref 0.0–0.5)
Eosinophils Relative: 5 %
HCT: 41.5 % (ref 39.0–52.0)
Hemoglobin: 12.9 g/dL — ABNORMAL LOW (ref 13.0–17.0)
Immature Granulocytes: 0 %
Lymphocytes Relative: 32 %
Lymphs Abs: 2.6 10*3/uL (ref 0.7–4.0)
MCH: 25.5 pg — ABNORMAL LOW (ref 26.0–34.0)
MCHC: 31.1 g/dL (ref 30.0–36.0)
MCV: 82.2 fL (ref 80.0–100.0)
Monocytes Absolute: 0.8 10*3/uL (ref 0.1–1.0)
Monocytes Relative: 9 %
Neutro Abs: 4.2 10*3/uL (ref 1.7–7.7)
Neutrophils Relative %: 53 %
Platelets: 292 10*3/uL (ref 150–400)
RBC: 5.05 MIL/uL (ref 4.22–5.81)
RDW: 14 % (ref 11.5–15.5)
WBC: 8 10*3/uL (ref 4.0–10.5)
nRBC: 0 % (ref 0.0–0.2)

## 2019-08-01 LAB — BASIC METABOLIC PANEL
Anion gap: 9 (ref 5–15)
BUN: 8 mg/dL (ref 6–20)
CO2: 25 mmol/L (ref 22–32)
Calcium: 8.6 mg/dL — ABNORMAL LOW (ref 8.9–10.3)
Chloride: 104 mmol/L (ref 98–111)
Creatinine, Ser: 0.76 mg/dL (ref 0.61–1.24)
GFR calc Af Amer: >60 mL/min (ref >60)
GFR calc non Af Amer: >60 mL/min (ref >60)
Glucose, Bld: 104 mg/dL — ABNORMAL HIGH (ref 70–99)
Potassium: 3.5 mmol/L (ref 3.5–5.1)
Sodium: 138 mmol/L (ref 135–145)

## 2019-08-01 LAB — PROCALCITONIN: Procalcitonin: 0.76 ng/mL

## 2019-08-01 MED ORDER — AMOXICILLIN-POT CLAVULANATE 875-125 MG PO TABS: 1.0000 | ORAL_TABLET | Freq: Two times a day (BID) | ORAL | 0 refills | Status: DC

## 2019-08-01 MED ORDER — SODIUM CHLORIDE 0.9 % IV SOLN
INTRAVENOUS | Status: DC | PRN
  Administered 2019-08-01: 250 mL via INTRAVENOUS

## 2019-08-01 MED ORDER — OXYMETAZOLINE HCL 0.05 % NA SOLN: 2.0000 | Freq: Two times a day (BID) | NASAL | 0 refills | Status: AC | PRN

## 2019-08-01 NOTE — TOC Initial Note (Signed)
Transition of Care [facility]) - Initial/Assessment Note    Patient Details  Name: Alex Potter MRN: [id number] Date of Birth: 08-07-1995  Transition of Care [facility]) CM/SW Contact:    Ida Rogue, LCSW Phone Number: 08/01/2019, 11:48 AM  Clinical Narrative:   Was alerted by RN that patient who is discharging today is in need of clothing to get home.  Found shirt, shoes for him in clothing closet, RN found scrub pants to give. Responding to consult re: SA issues.  Offered patient resources for abstinence, harm reduction.  Patient declined offer. TOC sign off.             Expected Discharge Plan: Home/Self Care Barriers to Discharge: No Barriers Identified   Patient Goals and CMS Choice        Expected Discharge Plan and Services Expected Discharge Plan: Home/Self Care         Expected Discharge Date: 08/01/19                                    Prior Living Arrangements/Services                       Activities of Daily Living Home Assistive Devices/Equipment: None ADL Screening (condition at time of admission) Patient's cognitive ability adequate to safely complete daily activities?: No(-patient lethargic and on non rebreather mask) Is the patient deaf or have difficulty hearing?: No Does the patient have difficulty seeing, even when wearing glasses/contacts?: No Does the patient have difficulty concentrating, remembering, or making decisions?: Yes Patient able to express need for assistance with ADLs?: Yes Does the patient have difficulty dressing or bathing?: Yes Independently performs ADLs?: Yes (appropriate for developmental age) Does the patient have difficulty walking or climbing stairs?: Yes Weakness of Legs: Both Weakness of Arms/Hands: None  Permission Sought/Granted                  Emotional Assessment              Admission diagnosis:  Rash and nonspecific skin eruption [R21] Syphilis [A53.9] Polysubstance abuse (HCC)  [F19.10] Heroin overdose (HCC) [T40.1X1A] SIRS (systemic inflammatory response syndrome) (HCC) [R65.10] Acute respiratory failure with hypoxia (HCC) [J96.01] Acute encephalopathy [G93.40] Aspiration pneumonia of left lung, unspecified aspiration pneumonia type, unspecified part of lung (HCC) [J69.0] Patient Active Problem List   Diagnosis Date Noted  . Syphilis   . Aspiration pneumonia of left lung (HCC) 07/30/2019  . Diffuse papular rash - rule out syphilis vs scabies 07/30/2019  . AKI (acute kidney injury) (HCC) 07/30/2019  . Heroin overdose (HCC) 07/30/2019  . Heroin overdose, accidental or unintentional, initial encounter (HCC)   . Acute encephalopathy   . Sepsis (HCC) 11/03/2017  . Acute metabolic encephalopathy 11/03/2017  . Acute respiratory failure with hypoxia (HCC) 11/03/2017  . Aspiration into airway   . Lactic acidosis   . Choledocholithiasis   . Abnormal liver function 07/04/2017  . Depression 07/04/2017  . Acute cholecystitis 07/03/2017  . Gallstone 07/03/2017  . Right upper quadrant abdominal pain 07/03/2017  . Cholecystitis 07/03/2017  . Tobacco abuse   . Polysubstance abuse [facility])    PCP:  Patient, No Pcp Per Pharmacy:   [facility] [phone] [place], [place] - [address] [place] [address] Phone: [phone] Fax: [phone]     Social Determinants of Health (SDOH) Interventions    Readmission  Risk Interventions No flowsheet data found.

## 2019-08-01 NOTE — Progress Notes (Signed)
Pt reports sleeping med did not help him at all

## 2019-08-01 NOTE — Discharge Summary (Signed)
Physician Discharge Summary  Alex Potter HFW:[id number] DOB: December 21, 1995 DOA: 07/29/2019  PCP: Patient, No Pcp Per  Admit date: 07/29/2019 Discharge date: 08/01/2019  Admitted From: Home Disposition: Home   Recommendations for Outpatient Follow-up:  1. Establish care with PCP in 1-2 weeks  Home Health: None Equipment/Devices: None Discharge Condition: Stable CODE STATUS: Full Diet recommendation: As tolerated  Brief/Interim Summary: Alex Potter a23 y.o.malewith a history of polysubstance abuse, recurrent overdoses who presented from a motel with reports of drug overdose, unresponsive. He was responsive to narcan, complaining of shortness of breath and stating he "partied too hard," didn't intentionally overdose. He was tachypneic and hypoxic, afebrile with notable rash. UDS positive for THC, cocaine, negative for opioids. IV antibiotics given for concern of aspiration, RPR sent and patient admitted. RPR has returned positive and ID was consulted. IM penicillin was given per ID recommendations. Mentation normalized, hypoxemia resolved, and he declined lumbar puncture prior to discharge.   Discharge Diagnoses:  Principal Problem:   Heroin overdose, accidental or unintentional, initial encounter [facility]) Active Problems:   Polysubstance abuse (HCC)   Acute respiratory failure with hypoxia (HCC)   Aspiration pneumonia of left lung (HCC)   Diffuse papular rash - rule out syphilis vs scabies   AKI (acute kidney injury) (HCC)   Heroin overdose (HCC)   Syphilis  Acute hypoxic respiratory failure, presumably due to aspiration pneumonitis and/or pneumonia due to drug overdose: - No evidence of withdrawal since admission.  - Complete 5 days abx. Hypoxemia resolved at discharge.    Acute toxic encephalopathy: Mentation has completely normalized. CT head negative for intracranial abnormality.    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 s/p PCN IM x1 07/30/2019. - ID consulted. Hepatitis  panel, HIV, GC/Chl negative/NR.  AKI:  - Resolved.  Polysubstance abuse:  - Not interested in quitting. CSW consulted, resources provided.  Tobacco use:  - Nicotine patch provided.   Chronic sinusitis:  - Decongestants, abx.   Discharge Instructions Discharge Instructions    Discharge instructions   Complete by: As directed    You were admitted for heroin overdose and for polysubstance abuse. You are encouraged to abstain from all substances and seek resources/help. You were given penicillin for syphilis and will continue augmentin for 3 more days (sent to your pharmacy) to treat aspiration pneumonia. Your hardness of hearing is likely due to sinus disease and using a decongestant like afrin (sent to your pharmacy) is recommended. If this symptom continues, you develop headache, neck pain or stiffness, or confusion, seek medical attention right away.     Allergies as of 08/01/2019      Reactions   Mushroom Extract Complex Itching, Swelling      Medication List    STOP taking these medications   guaiFENesin 600 MG 12 hr tablet Commonly known as: MUCINEX   nicotine 21 mg/24hr patch Commonly known as: NICODERM CQ - dosed in mg/24 hours     TAKE these medications   amoxicillin-clavulanate 875-125 MG tablet Commonly known as: Augmentin Take 1 tablet by mouth 2 (two) times daily for 2 days.   oxymetazoline 0.05 % nasal spray Commonly known as: AFRIN Place 2 sprays into both nostrils 2 (two) times daily as needed for congestion.      Follow-up Information    [facility]. Schedule an appointment as soon as possible for a visit.   Contact information: [address] [place] [address] [phone]         Allergies  Allergen Reactions  . Mushroom Extract Complex Itching and Swelling    Consultations:  ID  Procedures/Studies: CT HEAD WO CONTRAST  Result Date: 07/31/2019 CLINICAL DATA:  24 year old male  with history of polysubstance abuse presenting with in cephalopathy. EXAM: CT HEAD WITHOUT CONTRAST TECHNIQUE: Contiguous axial images were obtained from the base of the skull through the vertex without intravenous contrast. COMPARISON:  Head CT 11/03/2017. FINDINGS: Brain: No evidence of acute infarction, hemorrhage, hydrocephalus, extra-axial collection or mass lesion/mass effect. Vascular: No hyperdense vessel or unexpected calcification. Skull: Normal. Negative for fracture or focal lesion. Sinuses/Orbits: No acute finding. Multifocal mucosal thickening, most evident in the right maxillary and bilateral sphenoid sinuses. Other: None. IMPRESSION: 1. No acute intracranial abnormalities. 2. Chronic paranasal sinus disease, as above. Electronically Signed   By: Trudie Reed M.D.   On: 07/31/2019 17:55   DG Chest Port 1 View  Result Date: 07/30/2019 CLINICAL DATA:  24 year old male with overdose. Concern for aspiration. EXAM: PORTABLE CHEST 1 VIEW COMPARISON:  Chest radiograph dated 06/01/2019. FINDINGS: Left perihilar and infrahilar faint reticulonodular densities may represent atelectasis or aspiration. Clinical correlation is recommended. No lobar consolidation, pleural effusion or pneumothorax. The cardiac silhouette is within limits. No acute osseous pathology. IMPRESSION: Left perihilar and infrahilar atelectasis versus aspiration. Electronically Signed   By: Elgie Collard M.D.   On: 07/30/2019 00:12     Subjective: Says he's leaving this morning to go get his stuff. Will not consent to LP. Having no headache, neck ache, neck stiffness. Breathing back at baseline.   Discharge Exam: Vitals:   08/01/19 0006 08/01/19 0428  BP: 130/76 119/89  Pulse: 63 70  Resp: 18 (!) 21  Temp: 98.3 F (36.8 C) (!) 97.5 F (36.4 C)  SpO2: 96% 97%   General: Pt is alert, awake, not in acute distress Cardiovascular: RRR, S1/S2 +, no rubs, no gallops Respiratory: CTA bilaterally, no wheezing, no  rhonchi Abdominal: Soft, NT, ND, bowel sounds + Extremities: No edema, no cyanosis  Labs: BNP (last 3 results) No results for input(s): BNP in the last 8760 hours. Basic Metabolic Panel: Recent Labs  Lab 07/29/19 2353 07/30/19 0129 07/30/19 0541 07/31/19 0546 08/01/19 0553  NA 137  --  136 139 138  K 5.8* 4.0 3.9 3.9 3.5  CL 101  --  103 101 104  CO2 27  --  26 26 25   GLUCOSE 101*  --  106* 82 104*  BUN 8  --  8 10 8   CREATININE 1.25*  --  0.85 0.78 0.76  CALCIUM 8.2*  --  8.0* 8.8* 8.6*   Liver Function Tests: Recent Labs  Lab 07/30/19 0233 07/30/19 0541 07/31/19 0546  AST 27 24 18   ALT 14 15 14   ALKPHOS 80 74 68  BILITOT 0.7 1.1 1.5*  PROT 7.1 6.9 6.9  ALBUMIN 3.3* 3.1* 3.0*   No results for input(s): LIPASE, AMYLASE in the last 168 hours. No results for input(s): AMMONIA in the last 168 hours. CBC: Recent Labs  Lab 07/29/19 2353 07/30/19 0541 07/31/19 0546 08/01/19 0553  WBC 8.7 15.2* 9.9 8.0  NEUTROABS 6.9 12.8*  --  4.2  HGB 14.2 14.0 13.3 12.9*  HCT 45.7 45.5 43.6 41.5  MCV 81.9 83.0 83.5 82.2  PLT 352 279 250 292   Cardiac Enzymes: No results for input(s): CKTOTAL, CKMB, CKMBINDEX, TROPONINI in the last 168 hours. BNP: Invalid input(s): POCBNP CBG: No results for input(s): GLUCAP in the last 168 hours. D-Dimer No results for  input(s): DDIMER in the last 72 hours. Hgb A1c No results for input(s): HGBA1C in the last 72 hours. Lipid Profile No results for input(s): CHOL, HDL, LDLCALC, TRIG, CHOLHDL, LDLDIRECT in the last 72 hours. Thyroid function studies No results for input(s): TSH, T4TOTAL, T3FREE, THYROIDAB in the last 72 hours.  Invalid input(s): FREET3 Anemia work up No results for input(s): VITAMINB12, FOLATE, FERRITIN, TIBC, IRON, RETICCTPCT in the last 72 hours. Urinalysis    Component Value Date/Time   COLORURINE YELLOW 11/03/2017 1020   APPEARANCEUR CLEAR 11/03/2017 1020   LABSPEC 1.020 11/03/2017 1020   PHURINE 5.0  11/03/2017 1020   GLUCOSEU 50 (A) 11/03/2017 1020   HGBUR MODERATE (A) 11/03/2017 1020   BILIRUBINUR NEGATIVE 11/03/2017 1020   KETONESUR NEGATIVE 11/03/2017 1020   PROTEINUR NEGATIVE 11/03/2017 1020   NITRITE NEGATIVE 11/03/2017 1020   LEUKOCYTESUR NEGATIVE 11/03/2017 1020    Microbiology Recent Results (from the past 240 hour(s))  SARS CORONAVIRUS 2 (TAT 6-24 HRS) Nasopharyngeal Nasopharyngeal Swab     Status: None   Collection Time: 07/30/19  3:45 AM   Specimen: Nasopharyngeal Swab  Result Value Ref Range Status   SARS Coronavirus 2 NEGATIVE NEGATIVE Final    Comment: (NOTE) SARS-CoV-2 target nucleic acids are NOT DETECTED. The SARS-CoV-2 RNA is generally detectable in upper and lower respiratory specimens during the acute phase of infection. Negative results do not preclude SARS-CoV-2 infection, do not rule out co-infections with other pathogens, and should not be used as the sole basis for treatment or other patient management decisions. Negative results must be combined with clinical observations, patient history, and epidemiological information. The expected result is Negative. Fact Sheet for Patients: SugarRoll.be Fact Sheet for Healthcare Providers: https://www.woods-mathews.com/ This test is not yet approved or cleared by the [place] FDA and  has been authorized for detection and/or diagnosis of SARS-CoV-2 by FDA under an Emergency Use Authorization (EUA). This EUA will remain  in effect (meaning this test can be used) for the duration of the COVID-19 declaration under Section 56 4(b)(1) of the Act, 21 U.S.C. section 360bbb-3(b)(1), unless the authorization is terminated or revoked sooner. Performed at [facility], [place] [address] [place], [place] [address]     Time coordinating discharge: Approximately 40 minutes  Patrecia Pour, MD  [facility] 08/01/2019, 10:10 AM

## 2019-08-01 NOTE — Progress Notes (Signed)
Pt becoming more irritable, about 0230 IV pump was alarming "infusion complete" so he disconnected IV tubing and I foung it laying on the floor. Informed pt the pump just needed volume reset & he should not be tampering with IV pump or taking IV tubing apart that the nurse is to manage this. New tubing obtained & IVfluids resumed. Pt now wanting the sleeping medicine he declined at midnight

## 2019-08-01 NOTE — Plan of Care (Signed)
  Problem: Activity: Goal: Ability to tolerate increased activity will improve Outcome: Adequate for Discharge   Problem: Clinical Measurements: Goal: Ability to maintain a body temperature in the normal range will improve Outcome: Adequate for Discharge   Problem: Respiratory: Goal: Ability to maintain adequate ventilation will improve Outcome: Adequate for Discharge Goal: Ability to maintain a clear airway will improve Outcome: Adequate for Discharge   

## 2019-08-02 ENCOUNTER — Other Ambulatory Visit: Payer: Self-pay

## 2019-08-02 ENCOUNTER — Emergency Department (HOSPITAL_COMMUNITY)
Admission: EM | Admit: 2019-08-02 | Discharge: 2019-08-03 | Disposition: A | Discharge status: Home / Self Care | Payer: BLUE CROSS/BLUE SHIELD | Attending: Emergency Medicine | Admitting: Emergency Medicine

## 2019-08-02 ENCOUNTER — Encounter (HOSPITAL_COMMUNITY): Payer: Self-pay

## 2019-08-02 DIAGNOSIS — F1510 Other stimulant abuse, uncomplicated: Secondary | ICD-10-CM | POA: Diagnosis not present

## 2019-08-02 DIAGNOSIS — F17200 Nicotine dependence, unspecified, uncomplicated: Secondary | ICD-10-CM | POA: Diagnosis not present

## 2019-08-02 DIAGNOSIS — F1910 Other psychoactive substance abuse, uncomplicated: Secondary | ICD-10-CM

## 2019-08-02 DIAGNOSIS — Z760 Encounter for issue of repeat prescription: Secondary | ICD-10-CM | POA: Diagnosis not present

## 2019-08-02 DIAGNOSIS — F1210 Cannabis abuse, uncomplicated: Secondary | ICD-10-CM | POA: Diagnosis not present

## 2019-08-02 NOTE — ED Triage Notes (Signed)
Pt BIB [facility] from [employer] after wanting help with withdrawal/detox. Pt was recently admitted for aspiration pneumonia after a Heroin overdose. Pt states that he has not had a chance to get his abx filled after discharge. Pt is A&Ox4 and ambulatory.

## 2019-08-03 ENCOUNTER — Telehealth: Payer: Self-pay | Admitting: Infectious Diseases

## 2019-08-03 MED ORDER — AMOXICILLIN-POT CLAVULANATE 875-125 MG PO TABS
1.0000 | ORAL_TABLET | Freq: Once | ORAL | Status: AC
  Administered 2019-08-03: 1 via ORAL
  Filled 2019-08-03: qty 1

## 2019-08-03 MED ORDER — AMOXICILLIN-POT CLAVULANATE 875-125 MG PO TABS: 1.0000 | ORAL_TABLET | Freq: Two times a day (BID) | ORAL | 0 refills | Status: AC

## 2019-08-03 NOTE — Discharge Instructions (Signed)
I printed your prescription so you can take it to the pharmacy.  I've also attached a resource guide for substance abuse.  Return for worsening shortness of breath, fever, severe cough.

## 2019-08-03 NOTE — ED Notes (Signed)
Pt provided with taxi numbers. Pt is ambulatory with a steady gait.

## 2019-08-03 NOTE — Telephone Encounter (Signed)
error 

## 2019-08-03 NOTE — ED Provider Notes (Signed)
[facility] DEPT Provider Note   CSN: [id number] Arrival date & time: 08/02/19  2325     History Chief Complaint  Patient presents with  . Detox    Alex Potter is a 24 y.o. male.  Patient presents to the emergency department with a chief complaint of heroin abuse.  He states that he was recently admitted for aspiration pneumonia.  He was just discharged on 3/22.  He states that he would like help detoxing.  He would also like to get a dose of his antibiotic.  He states that he was unable to get his antibiotics because of the weekend.  He denies any new symptoms.  He states that he would like to be discharged after getting a dose of his antibiotics.  The history is provided by the patient. No language interpreter was used.       Past Medical History:  Diagnosis Date  . Drug abuse ([place])   . Tobacco abuse     Patient Active Problem List   Diagnosis Date Noted  . Syphilis   . Aspiration pneumonia of left lung ([place]) 07/30/2019  . Diffuse papular rash - rule out syphilis vs scabies 07/30/2019  . AKI (acute kidney injury) ([place]) 07/30/2019  . Heroin overdose ([place]) 07/30/2019  . Heroin overdose, accidental or unintentional, initial encounter ([place])   . Acute encephalopathy   . Sepsis ([place]) 11/03/2017  . Acute metabolic encephalopathy 29/92/4268  . Acute respiratory failure with hypoxia ([place]) 11/03/2017  . Aspiration into airway   . Lactic acidosis   . Choledocholithiasis   . Abnormal liver function 07/04/2017  . Depression 07/04/2017  . Acute cholecystitis 07/03/2017  . Gallstone 07/03/2017  . Right upper quadrant abdominal pain 07/03/2017  . Cholecystitis 07/03/2017  . Tobacco abuse   . Polysubstance abuse [facility])     Past Surgical History:  Procedure Laterality Date  . CHOLECYSTECTOMY N/A 07/05/2017   Procedure: LAPAROSCOPIC CHOLECYSTECTOMY WITH INTRAOPERATIVE CHOLANGIOGRAM;  Surgeon: Judeth Horn, MD;  Location: [place];  Service: General;   Laterality: N/A;  . ERCP N/A 07/04/2017   Procedure: ENDOSCOPIC RETROGRADE CHOLANGIOPANCREATOGRAPHY (ERCP);  Surgeon: Doran Stabler, MD;  Location: [place];  Service: Gastroenterology;  Laterality: N/A;  . INGUINAL HERNIA REPAIR Right        History reviewed. No pertinent family history.  Social History   Tobacco Use  . Smoking status: Current Every Day Smoker  . Smokeless tobacco: Never Used  Substance Use Topics  . Alcohol use: Yes  . Drug use: Yes    Types: Marijuana, Methamphetamines    Home Medications Prior to Admission medications   Medication Sig Start Date End Date Taking? Authorizing Provider  amoxicillin-clavulanate (AUGMENTIN) 875-125 MG tablet Take 1 tablet by mouth 2 (two) times daily for 2 days. 08/03/19 08/05/19  Montine Circle, PA-C  oxymetazoline (AFRIN) 0.05 % nasal spray Place 2 sprays into both nostrils 2 (two) times daily as needed for congestion. 08/01/19   Patrecia Pour, MD    Allergies    Mushroom extract complex  Review of Systems   Review of Systems  All other systems reviewed and are negative.   Physical Exam Updated Vital Signs BP 113/83 (BP Location: Left Arm)   Pulse 94   Temp 98.5 F (36.9 C) (Oral)   Resp 16   SpO2 100%   Physical Exam Vitals and nursing note reviewed.  Constitutional:      Appearance: He is well-developed.  HENT:     Head:  Normocephalic and atraumatic.  Eyes:     Conjunctiva/sclera: Conjunctivae normal.  Cardiovascular:     Rate and Rhythm: Normal rate and regular rhythm.  Pulmonary:     Effort: Pulmonary effort is normal. No respiratory distress.  Abdominal:     General: There is no distension.  Musculoskeletal:        General: Normal range of motion.     Cervical back: Neck supple.  Skin:    General: Skin is warm and dry.  Neurological:     Mental Status: He is alert and oriented to person, place, and time.  Psychiatric:        Mood and Affect: Mood normal.        Behavior: Behavior  normal.     ED Results / Procedures / Treatments   Labs (all labs ordered are listed, but only abnormal results are displayed) Labs Reviewed - No data to display  EKG None  Radiology No results found.  Procedures Procedures (including critical care time)  Medications Ordered in ED Medications  amoxicillin-clavulanate (AUGMENTIN) 875-125 MG per tablet 1 tablet (has no administration in time range)    ED Course  I have reviewed the triage vital signs and the nursing notes.  Pertinent labs & imaging results that were available during my care of the patient were reviewed by me and considered in my medical decision making (see chart for details).    MDM Rules/Calculators/A&P                      Patient states that he was recently admitted for aspiration pneumonia.  He was unable to get his antibiotics filled because he thought the pharmacies were closed because of the weekend.  I have given him a dose of his antibiotic here.  I have given him a paper prescription that he can take to a pharmacy of his choice.  He is also requesting detox help, but acknowledges that we do not do that through the emergency department.  He is content with resources.  He states that he will follow up.  He states that he like to be discharged after getting his antibiotics.  He appears medically stable.   Final Clinical Impression(s) / ED Diagnoses Final diagnoses:  Medication refill  Polysubstance abuse [facility])    Rx / DC Orders ED Discharge Orders         Ordered    amoxicillin-clavulanate (AUGMENTIN) 875-125 MG tablet  2 times daily     08/03/19 0107           Roxy Horseman, PA-C 08/03/19 0113    Devoria Albe, MD 08/03/19 [phone]

## 2019-09-23 ENCOUNTER — Encounter (HOSPITAL_COMMUNITY): Payer: Self-pay

## 2019-09-23 ENCOUNTER — Emergency Department (HOSPITAL_COMMUNITY)
Admission: EM | Admit: 2019-09-23 | Discharge: 2019-09-23 | Disposition: A | Discharge status: Home / Self Care | Payer: BLUE CROSS/BLUE SHIELD | Attending: Emergency Medicine | Admitting: Emergency Medicine

## 2019-09-23 DIAGNOSIS — N179 Acute kidney failure, unspecified: Secondary | ICD-10-CM

## 2019-09-23 DIAGNOSIS — F17200 Nicotine dependence, unspecified, uncomplicated: Secondary | ICD-10-CM | POA: Diagnosis not present

## 2019-09-23 DIAGNOSIS — E872 Acidosis, unspecified: Secondary | ICD-10-CM

## 2019-09-23 DIAGNOSIS — F1910 Other psychoactive substance abuse, uncomplicated: Secondary | ICD-10-CM | POA: Diagnosis not present

## 2019-09-23 DIAGNOSIS — Z79899 Other long term (current) drug therapy: Secondary | ICD-10-CM | POA: Diagnosis not present

## 2019-09-23 LAB — CBC WITH DIFFERENTIAL/PLATELET
Abs Immature Granulocytes: 0.05 10*3/uL (ref 0.00–0.07)
Basophils Absolute: 0 10*3/uL (ref 0.0–0.1)
Basophils Relative: 0 %
Eosinophils Absolute: 0 10*3/uL (ref 0.0–0.5)
Eosinophils Relative: 0 %
HCT: 49.2 % (ref 39.0–52.0)
Hemoglobin: 15.9 g/dL (ref 13.0–17.0)
Immature Granulocytes: 0 %
Lymphocytes Relative: 19 %
Lymphs Abs: 2.7 10*3/uL (ref 0.7–4.0)
MCH: 25.9 pg — ABNORMAL LOW (ref 26.0–34.0)
MCHC: 32.3 g/dL (ref 30.0–36.0)
MCV: 80 fL (ref 80.0–100.0)
Monocytes Absolute: 0.9 10*3/uL (ref 0.1–1.0)
Monocytes Relative: 6 %
Neutro Abs: 10.7 10*3/uL — ABNORMAL HIGH (ref 1.7–7.7)
Neutrophils Relative %: 75 %
Platelets: 258 10*3/uL (ref 150–400)
RBC: 6.15 MIL/uL — ABNORMAL HIGH (ref 4.22–5.81)
RDW: 14 % (ref 11.5–15.5)
WBC: 14.4 10*3/uL — ABNORMAL HIGH (ref 4.0–10.5)
nRBC: 0 % (ref 0.0–0.2)

## 2019-09-23 LAB — COMPREHENSIVE METABOLIC PANEL
ALT: 21 U/L (ref 0–44)
AST: 42 U/L — ABNORMAL HIGH (ref 15–41)
Albumin: 4.7 g/dL (ref 3.5–5.0)
Alkaline Phosphatase: 75 U/L (ref 38–126)
Anion gap: 20 — ABNORMAL HIGH (ref 5–15)
BUN: 25 mg/dL — ABNORMAL HIGH (ref 6–20)
CO2: 17 mmol/L — ABNORMAL LOW (ref 22–32)
Calcium: 9.3 mg/dL (ref 8.9–10.3)
Chloride: 94 mmol/L — ABNORMAL LOW (ref 98–111)
Creatinine, Ser: 1.44 mg/dL — ABNORMAL HIGH (ref 0.61–1.24)
GFR calc Af Amer: >60 mL/min (ref >60)
GFR calc non Af Amer: >60 mL/min (ref >60)
Glucose, Bld: 72 mg/dL (ref 70–99)
Potassium: 3.5 mmol/L (ref 3.5–5.1)
Sodium: 131 mmol/L — ABNORMAL LOW (ref 135–145)
Total Bilirubin: 1.8 mg/dL — ABNORMAL HIGH (ref 0.3–1.2)
Total Protein: 8.6 g/dL — ABNORMAL HIGH (ref 6.5–8.1)

## 2019-09-23 LAB — ETHANOL: Alcohol, Ethyl (B): <10 mg/dL (ref <10)

## 2019-09-23 LAB — CK: Total CK: 802 U/L — ABNORMAL HIGH (ref 49–397)

## 2019-09-23 LAB — ACETAMINOPHEN LEVEL: Acetaminophen (Tylenol), Serum: <10 ug/mL — ABNORMAL LOW (ref 10–30)

## 2019-09-23 LAB — SALICYLATE LEVEL: Salicylate Lvl: <7 mg/dL — ABNORMAL LOW (ref 7.0–30.0)

## 2019-09-23 LAB — CBG MONITORING, ED: Glucose-Capillary: 72 mg/dL (ref 70–99)

## 2019-09-23 MED ORDER — SODIUM CHLORIDE 0.9 % IV BOLUS (SEPSIS)
1000.0000 mL | Freq: Once | INTRAVENOUS | Status: AC
  Administered 2019-09-23: 1000 mL via INTRAVENOUS

## 2019-09-23 NOTE — ED Triage Notes (Signed)
EMS responded to a "naked"call, pt has been on a three day drug binge, mainly meth, heroin and marijuana Pt claims to be SI because of the drugs and he's paranoid

## 2019-09-23 NOTE — ED Provider Notes (Signed)
TIME SEEN: 4:12 AM  CHIEF COMPLAINT: Intoxication  HPI: Patient is a 24 year old male with history of substance abuse who presents to the emergency department after he was brought in by EMS after he was found naked in public.  He reports to nursing staff that he had been using methamphetamine, heroin and marijuana.  Reported to nursing staff that he was suicidal.  On my evaluation, patient is unable to tell me what happened tonight or why he is here.  He denies any current pain.  ROS: Level 5 caveat secondary to intoxication  PAST MEDICAL HISTORY/PAST SURGICAL HISTORY:  Past Medical History:  Diagnosis Date  . Drug abuse (H[facility])   . Tobacco abuse     MEDICATIONS:  Prior to Admission medications   Medication Sig Start Date End Date Taking? Authorizing Provider  albuterol (VENTOLIN HFA) 108 (90 Base) MCG/ACT inhaler Inhale 1-2 puffs into the lungs every 6 (six) hours as needed for wheezing or shortness of breath.   Yes [provider]  oxymetazoline (AFRIN) 0.05 % nasal spray Place 2 sprays into both nostrils 2 (two) times daily as needed for congestion. Patient not taking: Reported on 09/23/2019 08/01/19   Tyrone Nine, MD    ALLERGIES:  Allergies  Allergen Reactions  . Mushroom Extract Complex Itching and Swelling    SOCIAL HISTORY:  Social History   Tobacco Use  . Smoking status: Current Every Day Smoker  . Smokeless tobacco: Never Used  Substance Use Topics  . Alcohol use: Yes    FAMILY HISTORY: History reviewed. No pertinent family history.  EXAM: BP 124/82   Pulse (!) 108   Temp 97.9 F (36.6 C) (Oral)   Resp 18   Ht 5\' 5"  (1.651 m)   Wt 79.4 kg   SpO2 96%   BMI 29.12 kg/m  CONSTITUTIONAL: Alert and oriented to person only.  He appears very intoxicated and is looking around the room as if he is confused. HEAD: Normocephalic, atraumatic EYES: Conjunctivae clear, pupils appear equal, EOM appear intact ENT: normal nose; moist mucous membranes NECK:  Supple, normal ROM CARD: Regular and mildly tachycardic; S1 and S2 appreciated; no murmurs, no clicks, no rubs, no gallops RESP: Normal chest excursion without splinting or tachypnea; breath sounds clear and equal bilaterally; no wheezes, no rhonchi, no rales, no hypoxia or respiratory distress, speaking full sentences ABD/GI: Normal bowel sounds; non-distended; soft, non-tender, no rebound, no guarding, no peritoneal signs, no hepatosplenomegaly BACK:  The back appears normal EXT: Normal ROM in all joints; no deformity noted, no edema; no cyanosis SKIN: Normal color for age and race; warm; no rash on exposed skin NEURO: Moves all extremities equally, clear speech, no facial asymmetry, witnessed ambulating to triage with EMS staff without difficulty with steady gait. PSYCH: Patient appears to be responding to internal stimuli.  He appears significantly intoxicated.  Unable to tell me if he is having SI or HI currently.  Unable to tell me if he is having hallucinations.  MEDICAL DECISION MAKING: Patient here intoxicated.  He is unable to provide any history at this time.  Told nursing staff that he has been taking methamphetamine, heroin and marijuana.  Currently wakes up to verbal stimuli.  Protecting his airway.  Pupils are not pinpoint.  Will obtain EKG, CBG, screening labs and urine and monitor until clinically sober and reassess before consulting TTS.  Reportedly was telling nursing staff that he was suicidal but he is unable to tell me if he is suicidal now.  ED PROGRESS: 6:20 AM  Pt still appears significantly intoxicated.  He will need further monitoring in the ED prior to disposition.  6:55 AM  Pt now more awake and conversant but still appears intoxicated.  He denies any current suicidality and states "aww that was just the drugs".  His labs have been reviewed/interpreted and show mild AKI and metabolic acidosis with elevated anion gap.  Normal blood glucose.  Likely secondary to dehydration,  previous agitation, drug abuse, AKI.  Doubt DKA.  CK mildly elevated at 806.  Minimal leukocytosis which is likely reactive.  He denies any infectious symptoms.  No fevers, cough, vomiting or diarrhea.  Doubt sepsis.  Denies pain currently.  Ethanol, Tylenol and salicylate levels negative.  Will hydrate patient.  Urine pending.  Anticipate discharge home with outpatient resources.  EKG reviewed/interpreted and shows no abnormality.  I reviewed all nursing notes and pertinent previous records as available.  I have reviewed and interpreted any EKGs, lab and urine results, imaging (as available).   EKG Interpretation  Date/Time:  Wednesday Sep 23 2019 06:57:20 EDT Ventricular Rate:  99 PR Interval:    QRS Duration: 97 QT Interval:  351 QTC Calculation: 451 R Axis:   82 Text Interpretation: Sinus rhythm ST elev, probable normal early repol pattern No significant change since last tracing Confirmed by Pryor Curia [phone]) on 09/23/2019 6:59:30 AM         Sabino Dick was evaluated in Emergency Department on 09/23/2019 for the symptoms described in the history of present illness. He was evaluated in the context of the global COVID-19 pandemic, which necessitated consideration that the patient might be at risk for infection with the SARS-CoV-2 virus that causes COVID-19. Institutional protocols and algorithms that pertain to the evaluation of patients at risk for COVID-19 are in a state of rapid change based on information released by regulatory bodies including the CDC and federal and state organizations. These policies and algorithms were followed during the patient's care in the ED.      Annette Bertelson, Delice Bison, DO 09/23/19 [phone]

## 2019-09-23 NOTE — Discharge Instructions (Addendum)
Make sure you drink plenty of fluids.

## 2019-09-23 NOTE — ED Notes (Signed)
Pt given mesh underwear.

## 2019-09-23 NOTE — ED Notes (Signed)
Pt resting at this time, unable to obtain urine sample, urinal provided at bedside, will monitor,

## 2019-09-23 NOTE — ED Notes (Signed)
Pt stuck twice for IV, both unsuccessful.

## 2019-09-23 NOTE — ED Notes (Signed)
Pt reports that he was never seen for his rash and really needs to be seen for rash on buttocks.  Made Dr Freida Busman aware

## 2019-09-23 NOTE — ED Provider Notes (Signed)
Patient seen initially by Dr. Zane Herald.  Patient given IV fluids here due to likely dehydration.  He is now awake and alert and denies any SI or HI.  Will discharge home   Lorre Nick, MD 09/23/19 1034

## 2019-10-03 ENCOUNTER — Emergency Department (HOSPITAL_COMMUNITY): Payer: BLUE CROSS/BLUE SHIELD

## 2019-10-03 ENCOUNTER — Other Ambulatory Visit: Payer: Self-pay

## 2019-10-03 ENCOUNTER — Encounter (HOSPITAL_COMMUNITY): Payer: Self-pay

## 2019-10-03 ENCOUNTER — Inpatient Hospital Stay (HOSPITAL_COMMUNITY)
Admission: EM | Admit: 2019-10-03 | Discharge: 2019-10-15 | DRG: 557 | Disposition: A | Discharge status: Home / Self Care | Payer: BLUE CROSS/BLUE SHIELD | Attending: Internal Medicine | Admitting: Internal Medicine

## 2019-10-03 DIAGNOSIS — Z7151 Drug abuse counseling and surveillance of drug abuser: Secondary | ICD-10-CM | POA: Diagnosis not present

## 2019-10-03 DIAGNOSIS — M79671 Pain in right foot: Secondary | ICD-10-CM | POA: Diagnosis present

## 2019-10-03 DIAGNOSIS — F329 Major depressive disorder, single episode, unspecified: Secondary | ICD-10-CM | POA: Diagnosis present

## 2019-10-03 DIAGNOSIS — Z79899 Other long term (current) drug therapy: Secondary | ICD-10-CM

## 2019-10-03 DIAGNOSIS — R34 Anuria and oliguria: Secondary | ICD-10-CM

## 2019-10-03 DIAGNOSIS — E875 Hyperkalemia: Secondary | ICD-10-CM | POA: Diagnosis present

## 2019-10-03 DIAGNOSIS — E871 Hypo-osmolality and hyponatremia: Secondary | ICD-10-CM | POA: Diagnosis present

## 2019-10-03 DIAGNOSIS — F19129 Other psychoactive substance abuse with intoxication, unspecified: Secondary | ICD-10-CM | POA: Diagnosis present

## 2019-10-03 DIAGNOSIS — R7401 Elevation of levels of liver transaminase levels: Secondary | ICD-10-CM | POA: Diagnosis present

## 2019-10-03 DIAGNOSIS — M25571 Pain in right ankle and joints of right foot: Secondary | ICD-10-CM | POA: Diagnosis present

## 2019-10-03 DIAGNOSIS — D72829 Elevated white blood cell count, unspecified: Secondary | ICD-10-CM | POA: Diagnosis present

## 2019-10-03 DIAGNOSIS — F14129 Cocaine abuse with intoxication, unspecified: Secondary | ICD-10-CM | POA: Diagnosis present

## 2019-10-03 DIAGNOSIS — R600 Localized edema: Secondary | ICD-10-CM | POA: Diagnosis present

## 2019-10-03 DIAGNOSIS — Z20822 Contact with and (suspected) exposure to covid-19: Secondary | ICD-10-CM | POA: Diagnosis present

## 2019-10-03 DIAGNOSIS — R0600 Dyspnea, unspecified: Secondary | ICD-10-CM

## 2019-10-03 DIAGNOSIS — I959 Hypotension, unspecified: Secondary | ICD-10-CM | POA: Diagnosis present

## 2019-10-03 DIAGNOSIS — F17200 Nicotine dependence, unspecified, uncomplicated: Secondary | ICD-10-CM | POA: Diagnosis present

## 2019-10-03 DIAGNOSIS — R0902 Hypoxemia: Secondary | ICD-10-CM | POA: Diagnosis present

## 2019-10-03 DIAGNOSIS — E8770 Fluid overload, unspecified: Secondary | ICD-10-CM | POA: Diagnosis present

## 2019-10-03 DIAGNOSIS — K7689 Other specified diseases of liver: Secondary | ICD-10-CM | POA: Diagnosis not present

## 2019-10-03 DIAGNOSIS — R609 Edema, unspecified: Secondary | ICD-10-CM | POA: Diagnosis not present

## 2019-10-03 DIAGNOSIS — F1910 Other psychoactive substance abuse, uncomplicated: Secondary | ICD-10-CM | POA: Diagnosis not present

## 2019-10-03 DIAGNOSIS — R200 Anesthesia of skin: Secondary | ICD-10-CM | POA: Diagnosis present

## 2019-10-03 DIAGNOSIS — Z9049 Acquired absence of other specified parts of digestive tract: Secondary | ICD-10-CM | POA: Diagnosis not present

## 2019-10-03 DIAGNOSIS — T796XXA Traumatic ischemia of muscle, initial encounter: Secondary | ICD-10-CM

## 2019-10-03 DIAGNOSIS — Z720 Tobacco use: Secondary | ICD-10-CM | POA: Diagnosis present

## 2019-10-03 DIAGNOSIS — N179 Acute kidney failure, unspecified: Secondary | ICD-10-CM

## 2019-10-03 DIAGNOSIS — M21371 Foot drop, right foot: Secondary | ICD-10-CM | POA: Diagnosis present

## 2019-10-03 DIAGNOSIS — K7200 Acute and subacute hepatic failure without coma: Secondary | ICD-10-CM | POA: Diagnosis present

## 2019-10-03 DIAGNOSIS — E785 Hyperlipidemia, unspecified: Secondary | ICD-10-CM | POA: Diagnosis present

## 2019-10-03 DIAGNOSIS — Z590 Homelessness: Secondary | ICD-10-CM | POA: Diagnosis not present

## 2019-10-03 DIAGNOSIS — M6282 Rhabdomyolysis: Secondary | ICD-10-CM | POA: Diagnosis present

## 2019-10-03 DIAGNOSIS — N170 Acute kidney failure with tubular necrosis: Secondary | ICD-10-CM | POA: Diagnosis present

## 2019-10-03 LAB — URINALYSIS, ROUTINE W REFLEX MICROSCOPIC
Bilirubin Urine: NEGATIVE
Glucose, UA: 150 mg/dL — AB
Ketones, ur: NEGATIVE mg/dL
Leukocytes,Ua: NEGATIVE
Nitrite: NEGATIVE
Protein, ur: 100 mg/dL — AB
Specific Gravity, Urine: 1.016 (ref 1.005–1.030)
pH: 6 (ref 5.0–8.0)

## 2019-10-03 LAB — COMPREHENSIVE METABOLIC PANEL
ALT: 758 U/L — ABNORMAL HIGH (ref 0–44)
ALT: 827 U/L — ABNORMAL HIGH (ref 0–44)
AST: 1659 U/L — ABNORMAL HIGH (ref 15–41)
AST: 1753 U/L — ABNORMAL HIGH (ref 15–41)
Albumin: 3.7 g/dL (ref 3.5–5.0)
Albumin: 3.8 g/dL (ref 3.5–5.0)
Alkaline Phosphatase: 100 U/L (ref 38–126)
Alkaline Phosphatase: 106 U/L (ref 38–126)
Anion gap: 11 (ref 5–15)
Anion gap: 12 (ref 5–15)
BUN: 27 mg/dL — ABNORMAL HIGH (ref 6–20)
BUN: 29 mg/dL — ABNORMAL HIGH (ref 6–20)
CO2: 21 mmol/L — ABNORMAL LOW (ref 22–32)
CO2: 20 mmol/L — ABNORMAL LOW (ref 22–32)
Calcium: 7.7 mg/dL — ABNORMAL LOW (ref 8.9–10.3)
Calcium: 7.9 mg/dL — ABNORMAL LOW (ref 8.9–10.3)
Chloride: 105 mmol/L (ref 98–111)
Chloride: 103 mmol/L (ref 98–111)
Creatinine, Ser: 2.42 mg/dL — ABNORMAL HIGH (ref 0.61–1.24)
Creatinine, Ser: 2.62 mg/dL — ABNORMAL HIGH (ref 0.61–1.24)
GFR calc Af Amer: 42 mL/min — ABNORMAL LOW (ref >60)
GFR calc Af Amer: 38 mL/min — ABNORMAL LOW (ref >60)
GFR calc non Af Amer: 36 mL/min — ABNORMAL LOW (ref >60)
GFR calc non Af Amer: 33 mL/min — ABNORMAL LOW (ref >60)
Glucose, Bld: 97 mg/dL (ref 70–99)
Glucose, Bld: 99 mg/dL (ref 70–99)
Potassium: 7.5 mmol/L (ref 3.5–5.1)
Sodium: 137 mmol/L (ref 135–145)
Sodium: 135 mmol/L (ref 135–145)
Total Bilirubin: 1.6 mg/dL — ABNORMAL HIGH (ref 0.3–1.2)
Total Bilirubin: 1.6 mg/dL — ABNORMAL HIGH (ref 0.3–1.2)
Total Protein: 7.4 g/dL (ref 6.5–8.1)
Total Protein: 8 g/dL (ref 6.5–8.1)

## 2019-10-03 LAB — ACETAMINOPHEN LEVEL: Acetaminophen (Tylenol), Serum: <10 ug/mL — ABNORMAL LOW (ref 10–30)

## 2019-10-03 LAB — CBC WITH DIFFERENTIAL/PLATELET
Abs Immature Granulocytes: 0.18 10*3/uL — ABNORMAL HIGH (ref 0.00–0.07)
Basophils Absolute: 0.1 10*3/uL (ref 0.0–0.1)
Basophils Relative: 0 %
Eosinophils Absolute: 0 10*3/uL (ref 0.0–0.5)
Eosinophils Relative: 0 %
HCT: 58.4 % — ABNORMAL HIGH (ref 39.0–52.0)
Hemoglobin: 18 g/dL — ABNORMAL HIGH (ref 13.0–17.0)
Immature Granulocytes: 1 %
Lymphocytes Relative: 6 %
Lymphs Abs: 1.6 10*3/uL (ref 0.7–4.0)
MCH: 26.1 pg (ref 26.0–34.0)
MCHC: 30.8 g/dL (ref 30.0–36.0)
MCV: 84.8 fL (ref 80.0–100.0)
Monocytes Absolute: 2.3 10*3/uL — ABNORMAL HIGH (ref 0.1–1.0)
Monocytes Relative: 9 %
Neutro Abs: 21.7 10*3/uL — ABNORMAL HIGH (ref 1.7–7.7)
Neutrophils Relative %: 84 %
Platelets: 255 10*3/uL (ref 150–400)
RBC: 6.89 MIL/uL — ABNORMAL HIGH (ref 4.22–5.81)
RDW: 14.8 % (ref 11.5–15.5)
WBC: 25.7 10*3/uL — ABNORMAL HIGH (ref 4.0–10.5)
nRBC: 0.1 % (ref 0.0–0.2)

## 2019-10-03 LAB — SARS CORONAVIRUS 2 BY RT PCR (HOSPITAL ORDER, PERFORMED IN ~~LOC~~ HOSPITAL LAB): SARS Coronavirus 2: NEGATIVE

## 2019-10-03 LAB — RAPID URINE DRUG SCREEN, HOSP PERFORMED
Amphetamines: NOT DETECTED
Barbiturates: NOT DETECTED
Benzodiazepines: NOT DETECTED
Cocaine: NOT DETECTED
Opiates: POSITIVE — AB
Tetrahydrocannabinol: POSITIVE — AB

## 2019-10-03 LAB — CK: Total CK: 15275 U/L — ABNORMAL HIGH (ref 49–397)

## 2019-10-03 LAB — CBG MONITORING, ED: Glucose-Capillary: 181 mg/dL — ABNORMAL HIGH (ref 70–99)

## 2019-10-03 LAB — ETHANOL: Alcohol, Ethyl (B): <10 mg/dL (ref <10)

## 2019-10-03 MED ORDER — SODIUM POLYSTYRENE SULFONATE 15 GM/60ML PO SUSP
30.0000 g | Freq: Once | ORAL | Status: AC
  Administered 2019-10-03: 30 g via ORAL
  Filled 2019-10-03: qty 120

## 2019-10-03 MED ORDER — HEPARIN SODIUM (PORCINE) 5000 UNIT/ML IJ SOLN
5000.0000 [IU] | Freq: Three times a day (TID) | INTRAMUSCULAR | Status: DC
  Administered 2019-10-03 – 2019-10-15 (×37): 5000 [IU] via SUBCUTANEOUS
  Filled 2019-10-03 (×33): qty 1

## 2019-10-03 MED ORDER — SODIUM BICARBONATE 8.4 % IV SOLN: Freq: Once | INTRAVENOUS | Status: DC

## 2019-10-03 MED ORDER — CALCIUM GLUCONATE-NACL 1-0.675 GM/50ML-% IV SOLN
1.0000 g | Freq: Once | INTRAVENOUS | Status: AC
  Administered 2019-10-03: 1000 mg via INTRAVENOUS
  Filled 2019-10-03: qty 50

## 2019-10-03 MED ORDER — SODIUM CHLORIDE 0.9 % IV SOLN: INTRAVENOUS | Status: DC

## 2019-10-03 MED ORDER — INSULIN ASPART 100 UNIT/ML IV SOLN
5.0000 [IU] | Freq: Once | INTRAVENOUS | Status: AC
  Administered 2019-10-03: 5 [IU] via INTRAVENOUS
  Filled 2019-10-03: qty 0.05

## 2019-10-03 MED ORDER — SODIUM BICARBONATE-DEXTROSE 150-5 MEQ/L-% IV SOLN
150.0000 meq | Freq: Once | INTRAVENOUS | Status: AC
  Administered 2019-10-03: 150 meq via INTRAVENOUS
  Filled 2019-10-03: qty 1000

## 2019-10-03 MED ORDER — SODIUM CHLORIDE 0.9 % IV BOLUS
1000.0000 mL | Freq: Once | INTRAVENOUS | Status: AC
  Administered 2019-10-03: 1000 mL via INTRAVENOUS

## 2019-10-03 MED ORDER — FUROSEMIDE 10 MG/ML IJ SOLN
40.0000 mg | Freq: Once | INTRAMUSCULAR | Status: AC
  Administered 2019-10-03: 40 mg via INTRAVENOUS
  Filled 2019-10-03: qty 4

## 2019-10-03 MED ORDER — NICOTINE 14 MG/24HR TD PT24
14.0000 mg | MEDICATED_PATCH | Freq: Every day | TRANSDERMAL | Status: DC
  Administered 2019-10-03 – 2019-10-15 (×12): 14 mg via TRANSDERMAL
  Filled 2019-10-03 (×13): qty 1

## 2019-10-03 MED ORDER — ONDANSETRON HCL 4 MG/2ML IJ SOLN
4.0000 mg | Freq: Once | INTRAMUSCULAR | Status: AC
  Administered 2019-10-03: 4 mg via INTRAVENOUS
  Filled 2019-10-03: qty 2

## 2019-10-03 MED ORDER — SODIUM CHLORIDE 0.9% FLUSH
3.0000 mL | Freq: Two times a day (BID) | INTRAVENOUS | Status: DC
  Administered 2019-10-04 – 2019-10-15 (×21): 3 mL via INTRAVENOUS

## 2019-10-03 MED ORDER — GADOBUTROL 1 MMOL/ML IV SOLN
7.0000 mL | Freq: Once | INTRAVENOUS | Status: AC | PRN
  Administered 2019-10-03: 7 mL via INTRAVENOUS

## 2019-10-03 MED ORDER — SODIUM CHLORIDE 0.9 % IV SOLN
1.0000 g | Freq: Once | INTRAVENOUS | Status: DC
  Filled 2019-10-03: qty 10

## 2019-10-03 MED ORDER — TRAMADOL HCL 50 MG PO TABS
50.0000 mg | ORAL_TABLET | Freq: Four times a day (QID) | ORAL | Status: DC | PRN
  Administered 2019-10-03 – 2019-10-15 (×23): 50 mg via ORAL
  Filled 2019-10-03 (×23): qty 1

## 2019-10-03 MED ORDER — DEXTROSE 50 % IV SOLN
1.0000 | Freq: Once | INTRAVENOUS | Status: AC
  Administered 2019-10-03: 50 mL via INTRAVENOUS
  Filled 2019-10-03: qty 50

## 2019-10-03 NOTE — Assessment & Plan Note (Addendum)
-  Potassium 7.5 on repeat BMP.  EKG personally reviewed: NSR; T waves normal, P waves are depressed; QRS 82 Patient asymptomatic however is lethargic in bed -Initially received 1 g calcium gluconate, 5 units NovoLog insulin/D50, and bicarb infusion in the ER -Give now: 40 mg IV Lasix and 30 g Kayexalate -Repeat BMP this afternoon and continue trending

## 2019-10-03 NOTE — ED Provider Notes (Signed)
Physical Exam  BP (!) 163/90   Pulse (!) 106   Temp (!) 97.1 F (36.2 C)   Resp 18   SpO2 95%   Physical Exam Vitals and nursing note reviewed.  Constitutional:      General: He is not in acute distress.    Appearance: He is well-developed. He is not diaphoretic.  HENT:     Head: Normocephalic and atraumatic.  Eyes:     General: No scleral icterus.    Conjunctiva/sclera: Conjunctivae normal.  Pulmonary:     Effort: Pulmonary effort is normal. No respiratory distress.  Musculoskeletal:     Cervical back: Normal range of motion.  Skin:    Findings: No rash.  Neurological:     Mental Status: He is alert.     ED Course/Procedures   Clinical Course as of Oct 03 1247  Sat Oct 03, 2019  1043 Patient alert, asking for food/drink. Informed him that some labs hemolyzed and are being redrawn right now.   [HK]  1043 Creatinine(!): 2.62 [HK]  1159 AST(!): 1,753 [HK]  1159 ALT(!): 827 [HK]  1159 BUN(!): 29 [HK]  1159 Potassium(!!): 7.5 [HK]  1159 Will order insulin/dextrose, calcium and bicarb for hyperkalemia.   [HK]  7829 24 year old male history of substance abuse here on the overnight for pain in his shoulder.  He has multiple findings in his lab work of new renal failure elevated LFTs elevated potassium.  Also has an elevated white count.  CPK pending likely to be elevated.  MRI spine ordered to evaluate for epidural abscess.  Fortunately does not have that but has evidence of muscular injury through the gluteal area.  Likely causing some neuropraxia symptoms down his leg.  Will need admission.  Treatment for hyperkalemia.   [MB]    Clinical Course User Index [HK] Dietrich Pates, PA-C [MB] Terrilee Files, MD    Procedures  MDM   Care of patient assumed from PA [place] at 8:00 AM.  Agree with history, physical exam and plan.  See their note for further details.  Briefly, 24 y.o. male with PMH/PSH as below who presents with a chief complaint of left shoulder and left hip  pain that began tonight.  He admits to heroin use prior to arrival with a history of the same.  He was left behind a gas station picked up there by EMS. X-rays of the left shoulder and left hip were negative for acute abnormality.  Patient was slightly hypotensive to the 90s systolic as well as tachycardic.  He was given IV fluids after which he had 1 episode of large-volume nonbloody, nonbilious emesis.  He remained hypotensive after this. He then complained of right foot numbness and weakness with ambulation.  CRITICAL CARE Performed by: Dietrich Pates   Total critical care time: 35 minutes  Critical care time was exclusive of separately billable procedures and treating other patients.  Critical care was necessary to treat or prevent imminent or life-threatening deterioration.  Critical care was time spent personally by me on the following activities: development of treatment plan with patient and/or surrogate as well as nursing, discussions with consultants, evaluation of patient's response to treatment, examination of patient, obtaining history from patient or surrogate, ordering and performing treatments and interventions, ordering and review of laboratory studies, ordering and review of radiographic studies, pulse oximetry and re-evaluation of patient's condition.  Past Medical History:  Diagnosis Date  . Drug abuse (HCC)   . Tobacco abuse    Past Surgical  History:  Procedure Laterality Date  . CHOLECYSTECTOMY N/A 07/05/2017   Procedure: LAPAROSCOPIC CHOLECYSTECTOMY WITH INTRAOPERATIVE CHOLANGIOGRAM;  Surgeon: Jimmye Norman, MD;  [facility];  Service: General;  Laterality: N/A;  . ERCP N/A 07/04/2017   Procedure: ENDOSCOPIC RETROGRADE CHOLANGIOPANCREATOGRAPHY (ERCP);  Surgeon: Sherrilyn Rist, MD;  Location: [facility] ENDOSCOPY;  Service: Gastroenterology;  Laterality: N/A;  . INGUINAL HERNIA REPAIR Right       Current Plan: Obtain lab work, x-rays of the right foot and ankle, chest  x-ray.  We will need to obtain MRI of the lumbar spine due to his back pain and history of drug use although he denies IV drug use.   MDM/ED Course: 9:57 AM X-rays of the right foot and ankle and chest x-ray are unremarkable.  Pending lab work and MRI of the lumbar spine. I have spoken to the secretary who has let the MRI tech know about this imaging study. UDS is positive for opiates and THC.  CBC significant for leukocytosis of 25.7.  Ethanol level is 0. Patient on 2L of supplemental oxygen via nasal cannula.   12:09 PM MRI with findings related to muscle inflammation and necrosis. Further lab work difficult for hyperkalemia of 7.5, creatinine of 2.4, elevated AST and ALT.  Will begin treatment for hyperkalemia.  CK is pending the patient will need to be admitted for further evaluation and management of his AKI and acute liver failure from most likely rhabdo.  He does remember laying on concrete overnight from as early as 6 PM before he started experiencing "soreness." Will order COVID test.  Consults: Hospitalist- Dr. Frederick Peers   Significant labs/images: Labs Reviewed  CBC WITH DIFFERENTIAL/PLATELET - Abnormal; Notable for the following components:      Result Value   WBC 25.7 (*)    RBC 6.89 (*)    Hemoglobin 18.0 (*)    HCT 58.4 (*)    Neutro Abs 21.7 (*)    Monocytes Absolute 2.3 (*)    Abs Immature Granulocytes 0.18 (*)    All other components within normal limits  RAPID URINE DRUG SCREEN, HOSP PERFORMED - Abnormal; Notable for the following components:   Opiates POSITIVE (*)    Tetrahydrocannabinol POSITIVE (*)    All other components within normal limits  URINALYSIS, ROUTINE W REFLEX MICROSCOPIC - Abnormal; Notable for the following components:   Color, Urine AMBER (*)    APPearance CLOUDY (*)    Glucose, UA 150 (*)    Hgb urine dipstick LARGE (*)    Protein, ur 100 (*)    Bacteria, UA RARE (*)    All other components within normal limits  COMPREHENSIVE METABOLIC  PANEL - Abnormal; Notable for the following components:   CO2 21 (*)    BUN 27 (*)    Creatinine, Ser 2.42 (*)    Calcium 7.7 (*)    AST 1,659 (*)    ALT 758 (*)    Total Bilirubin 1.6 (*)    GFR calc non Af Amer 36 (*)    GFR calc Af Amer 42 (*)    All other components within normal limits  COMPREHENSIVE METABOLIC PANEL - Abnormal; Notable for the following components:   Potassium 7.5 (*)    CO2 20 (*)    BUN 29 (*)    Creatinine, Ser 2.62 (*)    Calcium 7.9 (*)    AST 1,753 (*)    ALT 827 (*)    Total Bilirubin 1.6 (*)    GFR  calc non Af Amer 33 (*)    GFR calc Af Amer 38 (*)    All other components within normal limits  ACETAMINOPHEN LEVEL - Abnormal; Notable for the following components:   Acetaminophen (Tylenol), Serum <10 (*)    All other components within normal limits  CULTURE, BLOOD (ROUTINE X 2)  CULTURE, BLOOD (ROUTINE X 2)  ETHANOL  CK  CK  HEPATITIS PANEL, ACUTE    Final diagnoses:  Polysubstance abuse (HCC)  AKI (acute kidney injury) ([place])  Acute liver failure without hepatic coma  Hyperkalemia  Traumatic rhabdomyolysis, initial encounter ([place])    I personally reviewed and interpreted all labs.  All imaging, if done today, including plain films, CT scans, and ultrasounds, independently reviewed by me, and interpretations confirmed via formal radiology reads.   The plan for this patient was discussed with Dr. Melina Copa, who voiced agreement and who oversaw evaluation and treatment of this patient.  Portions of this note were generated with [employer]. Dictation errors may occur despite best attempts at proofreading.     Delia Heady, PA-C 10/03/19 1303    Hayden Rasmussen, MD 10/03/19 1757

## 2019-10-03 NOTE — ED Triage Notes (Addendum)
Pt BIB [facility] from [place] where his friends lieft him. Reports using 2 bumps of heroin several hours ago. He states that he called EMS for L shoulder pain and "butt" pain. No respiratory distress with EMS. Easily arousable.

## 2019-10-03 NOTE — Assessment & Plan Note (Addendum)
-   acute liver injury; patient at risk for ALF - check PT/INR - follow up hepatitis panel - differential includes shock liver given rhabdo/hypoperfusion vs hepatic congestion vs infectious - trend LFTs - will warrant GI consult on transfer if further worsening

## 2019-10-03 NOTE — Assessment & Plan Note (Signed)
-  Etiology considered likely due to rhabdomyolysis with severely elevated CK along with probable dehydration as patient was in the hot sun all day on Friday and throughout the evening due to homelessness -Continue trending BMP (see hyperkalemia separately) -Continue IV fluids -Transfer to [facility] due to risk for worsening renal dysfunction and possible need for dialysis.  Hold off on nephrology consult for now, but if renal function declines, will need consult

## 2019-10-03 NOTE — ED Notes (Signed)
Patient transported to MRI at this time. 

## 2019-10-03 NOTE — ED Provider Notes (Signed)
[facility] DEPT Provider Note   CSN: [id number] Arrival date & time: 10/03/19  0151     History Chief Complaint  Patient presents with  . Shoulder Pain  . Drug Problem    Alex Potter is a 24 y.o. male.  Patient to ED with c/o left shoulder and left buttock/hip pain that started tonight. He denies fall or injury. He admits to heroin use prior to arrival. Per EMS he was picked up behind a [place] gas station after being left there by friends.   The history is provided by the patient. No language interpreter was used.  Shoulder Pain Associated symptoms: no back pain and no fever   Drug Problem Pertinent negatives include no chest pain, no abdominal pain and no headaches.       Past Medical History:  Diagnosis Date  . Drug abuse ([place])   . Tobacco abuse     Patient Active Problem List   Diagnosis Date Noted  . Syphilis   . Aspiration pneumonia of left lung ([place]) 07/30/2019  . Diffuse papular rash - rule out syphilis vs scabies 07/30/2019  . AKI (acute kidney injury) ([place]) 07/30/2019  . Heroin overdose ([place]) 07/30/2019  . Heroin overdose, accidental or unintentional, initial encounter ([place])   . Acute encephalopathy   . Sepsis ([place]) 11/03/2017  . Acute metabolic encephalopathy 63/87/5643  . Acute respiratory failure with hypoxia ([place]) 11/03/2017  . Aspiration into airway   . Lactic acidosis   . Choledocholithiasis   . Abnormal liver function 07/04/2017  . Depression 07/04/2017  . Acute cholecystitis 07/03/2017  . Gallstone 07/03/2017  . Right upper quadrant abdominal pain 07/03/2017  . Cholecystitis 07/03/2017  . Tobacco abuse   . Polysubstance abuse [facility])     Past Surgical History:  Procedure Laterality Date  . CHOLECYSTECTOMY N/A 07/05/2017   Procedure: LAPAROSCOPIC CHOLECYSTECTOMY WITH INTRAOPERATIVE CHOLANGIOGRAM;  Surgeon: Judeth Horn, MD;  Location: [facility];  Service: General;  Laterality: N/A;  . ERCP N/A 07/04/2017   Procedure: ENDOSCOPIC RETROGRADE CHOLANGIOPANCREATOGRAPHY (ERCP);  Surgeon: Doran Stabler, MD;  Location: [facility];  Service: Gastroenterology;  Laterality: N/A;  . INGUINAL HERNIA REPAIR Right        History reviewed. No pertinent family history.  Social History   Tobacco Use  . Smoking status: Current Every Day Smoker  . Smokeless tobacco: Never Used  Substance Use Topics  . Alcohol use: Yes  . Drug use: Yes    Types: Marijuana, Methamphetamines    Home Medications Prior to Admission medications   Medication Sig Start Date End Date Taking? Authorizing Provider  albuterol (VENTOLIN HFA) 108 (90 Base) MCG/ACT inhaler Inhale 1-2 puffs into the lungs every 6 (six) hours as needed for wheezing or shortness of breath.    [provider]  oxymetazoline (AFRIN) 0.05 % nasal spray Place 2 sprays into both nostrils 2 (two) times daily as needed for congestion. Patient not taking: Reported on 09/23/2019 08/01/19   Patrecia Pour, MD    Allergies    Mushroom extract complex  Review of Systems   Review of Systems  Constitutional: Negative for fever.  Cardiovascular: Negative for chest pain.  Gastrointestinal: Negative for abdominal pain and rectal pain.  Musculoskeletal: Negative for back pain and myalgias.       See HPI.  Skin: Negative for color change and wound.  Neurological: Negative for headaches.    Physical Exam Updated Vital Signs BP 106/78 (BP Location: Left Arm)   Pulse Marland Kitchen)  105   Temp 97.7 F (36.5 C) (Oral)   SpO2 95%   Physical Exam Vitals and nursing note reviewed.  Constitutional:      General: He is not in acute distress.    Appearance: He is well-developed and normal weight. He is not toxic-appearing.  HENT:     Head: Normocephalic.  Cardiovascular:     Rate and Rhythm: Normal rate and regular rhythm.  Pulmonary:     Effort: Pulmonary effort is normal.     Breath sounds: Normal breath sounds. No wheezing, rhonchi or rales.  Chest:      Chest wall: No tenderness.  Abdominal:     General: Bowel sounds are normal.     Palpations: Abdomen is soft.     Tenderness: There is no abdominal tenderness. There is no guarding or rebound.  Musculoskeletal:        General: Normal range of motion.     Cervical back: Normal range of motion and neck supple.     Comments: Left shoulder swollen without discoloration over deltoid. Moves arm without difficulty, held in relaxed position. Distal pulse present. No midline cervical tenderness. Left hip tender to light palpation. No bony deformity.   Skin:    General: Skin is warm and dry.     Findings: No rash.  Neurological:     Mental Status: He is oriented to person, place, and time.     ED Results / Procedures / Treatments   Labs (all labs ordered are listed, but only abnormal results are displayed) Labs Reviewed - No data to display  EKG None  Radiology No results found.  Procedures Procedures (including critical care time)  Medications Ordered in ED Medications - No data to display  ED Course  I have reviewed the triage vital signs and the nursing notes.  Pertinent labs & imaging results that were available during my care of the patient were reviewed by me and considered in my medical decision making (see chart for details).    MDM Rules/Calculators/A&P                      Patient to ED for evaluation of left shoulder and hip/buttock pain. Admits to heroin use tonight.   Imaging of the shoulder and hip/pelvis negative. The patient is resting. He is mildly tachycardic, mildly hypotensive. He reports he hasn't eaten today. Will have him attempt PO hydration.   5:00 - Patient had a large volume emesis x 1. He remains hypotensive, tachy. Will provide IV fluid bolus and Zofran. He reports vomiting is usual for him after doing heroin. He denies ongoing nausea. Denies abdominal pain. He now complains of pain and numbness in the right foot. He reports the entire foot from the  ankle distally is affected.   Will continue to observe.  6:45 - BP remains around 100 systolic. O2 saturation between 91-94%. No respiratory difficulty. No tachypnea. Attempt to ambulate the patient unsuccessful due to pain and numbness in right foot. He reports being unable to bear weight on the foot. He also now reports pain across his low back and right hip pain as well.   Patient with a history of substance abuse. He denies IVDA in the past. Consider him high risk for epidural abscess although distribution of symptoms is atypical.   On his shoulder x-ray there is a possible left lung opacity, recommends dedicated CXR.   Patient care signed out to [facility], PA-C, pending result of additional studies.  Final Clinical Impression(s) / ED Diagnoses Final diagnoses:  None   1. Musculoskeletal pain, multiple sites 2. Right foot numbness 3. Hypoxia 4. Substance abuse  Rx / DC Orders ED Discharge Orders    None       Elpidio Anis, Cordelia Poche 10/03/19 9470    Ward, Layla Maw, DO 10/03/19 2317

## 2019-10-03 NOTE — Assessment & Plan Note (Addendum)
-  CK 15,275 on admission.  He is at risk for renal failure with this level - generalized muscle aches with MRI L spine noting concern for muscle necrosis -Continue trending CK daily -Start on high rate maintenance fluids.  Received 2 L normal saline in the ER and bicarb infusion

## 2019-10-03 NOTE — Assessment & Plan Note (Signed)
-   suspect hemoconcentration and low suspicion at this time for infection; no obvious source but substance abuse places at increased risk - monitor clinically; repeat CBC in am after IVF overnight

## 2019-10-03 NOTE — H&P (Signed)
History and Physical    Deverick Pruss  WJX:[id number]  DOB: [birth year]  DOA: 10/03/2019  PCP: Patient, No Pcp Per Patient coming from: homelessness  Chief Complaint: "feels bad"  I have personally briefly reviewed patient's old medical records in [facility]  HPI:  Mr. Davidian is a 24 year old Caucasian male with PMH hyperlipidemia, homelessness who follows with [facility].  He typically is outside and sitting on the ground resting against buildings.  Yesterday, he states that he did "2 bumps of heroin".  He denies any IV drug use.  He then sat down on the concrete ground and laid against a cement building the rest of the day and night.  He then presented to the ER today due to "feeling bad". He has been homeless since approximately January after he was kicked out by his roommate. He endorses ongoing tobacco use, 1 PPD.  Ongoing alcohol use liquor and beer typically.  He would not elaborate as to quantity but states his last drink was on Friday. In the ER, UDS was positive for opiates and THC WBC 25.7, Hgb 18, HCT 58.4, platelets 255 Ethanol less than 10 UA: Large hemoglobin, negative RBCs, 100 protein CK 15,275 Na 125, K 7.5, Creat 2.62, BUN 29, AST 1753, ALT 827, TB 1.6 Tylenol level negative Hepatitis panel pending COVID-19 test negative  In the ER, he was treated with 1 g calcium gluconate, 5 units NovoLog insulin/D50, bicarb infusion, 2 L normal saline bolus.  He is being admitted for further treatment of rhabdomyolysis, renal failure, and elevated LFTs.  Given risk for further renal dysfunction, he will be transferred to [facility] for possible nephrology consult and/or need for dialysis if worsening renal function.  May also need GI consult if LFTs continue to escalate.  He is somnolent during interview but able to answer all questions appropriately.   Assessment/Plan: Rhabdomyolysis -CK 15,275 on admission.  He is at risk for renal failure with this level -  generalized muscle aches with MRI L spine noting concern for muscle necrosis -Continue trending CK daily -Start on high rate maintenance fluids.  Received 2 L normal saline in the ER and bicarb infusion  AKI (acute kidney injury) (HCC) -Etiology considered likely due to rhabdomyolysis with severely elevated CK along with probable dehydration as patient was in the hot sun all day on Friday and throughout the evening due to homelessness -Continue trending BMP (see hyperkalemia separately) -Continue IV fluids -Transfer to [facility] due to risk for worsening renal dysfunction and possible need for dialysis.  Hold off on nephrology consult for now, but if renal function declines, will need consult  Hyperkalemia -Potassium 7.5 on repeat BMP.  EKG personally reviewed: NSR; T waves normal, P waves are depressed; QRS 82 Patient asymptomatic however is lethargic in bed -Initially received 1 g calcium gluconate, 5 units NovoLog insulin/D50, and bicarb infusion in the ER -Give now: 40 mg IV Lasix and 30 g Kayexalate -Repeat BMP this afternoon and continue trending  Polysubstance abuse (HCC) -Patient endorsed snorting 2 rounds of heroin on 10/02/2019.  Monitor for signs of withdrawal -Cessation counseling offered on admission.  Patient apathetic at this time  Liver dysfunction - acute liver injury; patient at risk for ALF - check PT/INR - follow up hepatitis panel - differential includes shock liver given rhabdo/hypoperfusion vs hepatic congestion vs infectious - trend LFTs - will warrant GI consult on transfer if further worsening   Tobacco abuse - nicotine patch while hospitalized   Leukocytosis -  suspect hemoconcentration and low suspicion at this time for infection; no obvious source but substance abuse places at increased risk - monitor clinically; repeat CBC in am after IVF overnight    Code Status:  Code Status History    Date Active Date Inactive Code Status Order ID Comments User Context    07/30/2019 0437 08/01/2019 1731 Full Code [id number]  Carollee Herter, DO ED   11/03/2017 1937 11/07/2017 1738 Full Code [id number]  Tobey Grim, NP Inpatient   11/03/2017 1609 11/03/2017 1937 Full Code [id number]  Noralee Stain, DO Inpatient   07/03/2017 2327 07/09/2017 1842 Full Code [id number]  Lorretta Harp, MD ED   Advance Care Planning Activity      DVT Prophylaxis:unfractionated SQ heparin 5000 units 2 hours prior to surgery then every 12 hours Anticipated disposition is to homelessness  History: Past Medical History:  Diagnosis Date  . Drug abuse (HCC)   . Tobacco abuse     Past Surgical History:  Procedure Laterality Date  . CHOLECYSTECTOMY N/A 07/05/2017   Procedure: LAPAROSCOPIC CHOLECYSTECTOMY WITH INTRAOPERATIVE CHOLANGIOGRAM;  Surgeon: Jimmye Norman, MD;  Location: [facility] OR;  Service: General;  Laterality: N/A;  . ERCP N/A 07/04/2017   Procedure: ENDOSCOPIC RETROGRADE CHOLANGIOPANCREATOGRAPHY (ERCP);  Surgeon: Sherrilyn Rist, MD;  Location: [facility] ENDOSCOPY;  Service: Gastroenterology;  Laterality: N/A;  . INGUINAL HERNIA REPAIR Right      reports that he has been smoking. He has never used smokeless tobacco. He reports current alcohol use. He reports current drug use. Drugs: Marijuana and Methamphetamines.  Allergies  Allergen Reactions  . Mushroom Extract Complex Itching and Swelling    History reviewed. No pertinent family history.  Home Medications: Prior to Admission medications   Medication Sig Start Date End Date Taking? Authorizing Provider  albuterol (VENTOLIN HFA) 108 (90 Base) MCG/ACT inhaler Inhale 1-2 puffs into the lungs every 6 (six) hours as needed for wheezing or shortness of breath.   Yes [provider]  oxymetazoline (AFRIN) 0.05 % nasal spray Place 2 sprays into both nostrils 2 (two) times daily as needed for congestion. Patient not taking: Reported on 09/23/2019 08/01/19   Tyrone Nine, MD    Review of Systems:  Pertinent items noted in HPI  and remainder of comprehensive ROS otherwise negative.  Physical Exam: Vitals:   10/03/19 1300 10/03/19 1330 10/03/19 1400 10/03/19 1430  BP: 108/67 124/90 115/71 118/72  Pulse: 88 99 99 (!) 103  Resp: (!) 22 18 20  (!) 22  Temp:      TempSrc:      SpO2: 92% 91% 91% 90%   General appearance: Disheveled appearing young man lying in bed lethargic but in no distress Head: Normocephalic, without obvious abnormality Eyes: EOMI Lungs: clear to auscultation bilaterally Heart: regular rate and rhythm and S1, S2 normal Abdomen: normal findings: bowel sounds normal Extremities: No edema Skin: Small erythematous spots appreciated on right lower extremity that blanch with pressure Neurologic: Grossly normal  Labs on Admission:  I have personally reviewed following labs and imaging studies Results for orders placed or performed during the hospital encounter of 10/03/19 (from the past 24 hour(s))  Urine rapid drug screen (hosp performed)     Status: Abnormal   Collection Time: 10/03/19  7:12 AM  Result Value Ref Range   Opiates POSITIVE (A) NONE DETECTED   Cocaine NONE DETECTED NONE DETECTED   Benzodiazepines NONE DETECTED NONE DETECTED   Amphetamines NONE DETECTED NONE DETECTED   Tetrahydrocannabinol POSITIVE (A) NONE DETECTED  Barbiturates NONE DETECTED NONE DETECTED  CBC with Differential     Status: Abnormal   Collection Time: 10/03/19  7:32 AM  Result Value Ref Range   WBC 25.7 (H) 4.0 - 10.5 K/uL   RBC 6.89 (H) 4.22 - 5.81 MIL/uL   Hemoglobin 18.0 (H) 13.0 - 17.0 g/dL   HCT 93.8 (H) 18.2 - 99.3 %   MCV 84.8 80.0 - 100.0 fL   MCH 26.1 26.0 - 34.0 pg   MCHC 30.8 30.0 - 36.0 g/dL   RDW 71.6 96.7 - 89.3 %   Platelets 255 150 - 400 K/uL   nRBC 0.1 0.0 - 0.2 %   Neutrophils Relative % 84 %   Neutro Abs 21.7 (H) 1.7 - 7.7 K/uL   Lymphocytes Relative 6 %   Lymphs Abs 1.6 0.7 - 4.0 K/uL   Monocytes Relative 9 %   Monocytes Absolute 2.3 (H) 0.1 - 1.0 K/uL   Eosinophils Relative 0 %    Eosinophils Absolute 0.0 0.0 - 0.5 K/uL   Basophils Relative 0 %   Basophils Absolute 0.1 0.0 - 0.1 K/uL   Immature Granulocytes 1 %   Abs Immature Granulocytes 0.18 (H) 0.00 - 0.07 K/uL  Ethanol     Status: None   Collection Time: 10/03/19  7:32 AM  Result Value Ref Range   Alcohol, Ethyl (B) <10 <10 mg/dL  Urinalysis, Routine w reflex microscopic     Status: Abnormal   Collection Time: 10/03/19  7:32 AM  Result Value Ref Range   Color, Urine AMBER (A) YELLOW   APPearance CLOUDY (A) CLEAR   Specific Gravity, Urine 1.016 1.005 - 1.030   pH 6.0 5.0 - 8.0   Glucose, UA 150 (A) NEGATIVE mg/dL   Hgb urine dipstick LARGE (A) NEGATIVE   Bilirubin Urine NEGATIVE NEGATIVE   Ketones, ur NEGATIVE NEGATIVE mg/dL   Protein, ur 810 (A) NEGATIVE mg/dL   Nitrite NEGATIVE NEGATIVE   Leukocytes,Ua NEGATIVE NEGATIVE   RBC / HPF 0-5 0 - 5 RBC/hpf   WBC, UA 0-5 0 - 5 WBC/hpf   Bacteria, UA RARE (A) NONE SEEN   Squamous Epithelial / LPF 0-5 0 - 5   Mucus PRESENT   CK     Status: None   Collection Time: 10/03/19  8:55 AM  Result Value Ref Range   Total CK  49 - 397 U/L    SPECIMEN HEMOLYZED. HEMOLYSIS MAY AFFECT INTEGRITY OF RESULTS.  Comprehensive metabolic panel     Status: Abnormal   Collection Time: 10/03/19  8:55 AM  Result Value Ref Range   Sodium 137 135 - 145 mmol/L   Potassium  3.5 - 5.1 mmol/L    SPECIMEN HEMOLYZED. HEMOLYSIS MAY AFFECT INTEGRITY OF RESULTS.   Chloride 105 98 - 111 mmol/L   CO2 21 (L) 22 - 32 mmol/L   Glucose, Bld 97 70 - 99 mg/dL   BUN 27 (H) 6 - 20 mg/dL   Creatinine, Ser 1.75 (H) 0.61 - 1.24 mg/dL   Calcium 7.7 (L) 8.9 - 10.3 mg/dL   Total Protein 7.4 6.5 - 8.1 g/dL   Albumin 3.7 3.5 - 5.0 g/dL   AST 1,025 (H) 15 - 41 U/L   ALT 758 (H) 0 - 44 U/L   Alkaline Phosphatase 100 38 - 126 U/L   Total Bilirubin 1.6 (H) 0.3 - 1.2 mg/dL   GFR calc non Af Amer 36 (L) >60 mL/min   GFR calc Af Amer 42 (L) >60 mL/min  Anion gap 11 5 - 15  CK     Status: Abnormal    Collection Time: 10/03/19 10:28 AM  Result Value Ref Range   Total CK 15,275 (H) 49 - 397 U/L  Comprehensive metabolic panel     Status: Abnormal   Collection Time: 10/03/19 10:28 AM  Result Value Ref Range   Sodium 135 135 - 145 mmol/L   Potassium 7.5 (HH) 3.5 - 5.1 mmol/L   Chloride 103 98 - 111 mmol/L   CO2 20 (L) 22 - 32 mmol/L   Glucose, Bld 99 70 - 99 mg/dL   BUN 29 (H) 6 - 20 mg/dL   Creatinine, Ser 7.822.62 (H) 0.61 - 1.24 mg/dL   Calcium 7.9 (L) 8.9 - 10.3 mg/dL   Total Protein 8.0 6.5 - 8.1 g/dL   Albumin 3.8 3.5 - 5.0 g/dL   AST 9,5621,753 (H) 15 - 41 U/L   ALT 827 (H) 0 - 44 U/L   Alkaline Phosphatase 106 38 - 126 U/L   Total Bilirubin 1.6 (H) 0.3 - 1.2 mg/dL   GFR calc non Af Amer 33 (L) >60 mL/min   GFR calc Af Amer 38 (L) >60 mL/min   Anion gap 12 5 - 15  Acetaminophen level     Status: Abnormal   Collection Time: 10/03/19 10:29 AM  Result Value Ref Range   Acetaminophen (Tylenol), Serum <10 (L) 10 - 30 ug/mL  SARS Coronavirus 2 by RT PCR (hospital order[facility]) Nasopharyngeal Nasopharyngeal Swab     Status: None   Collection Time: 10/03/19 12:53 PM   Specimen: Nasopharyngeal Swab  Result Value Ref Range   SARS Coronavirus 2 NEGATIVE NEGATIVE  CBG monitoring, ED     Status: Abnormal   Collection Time: 10/03/19  1:19 PM  Result Value Ref Range   Glucose-Capillary 181 (H) 70 - 99 mg/dL     Radiological Exams on Admission: DG Chest 2 View  Result Date: 10/03/2019 CLINICAL DATA:  Possible left lung opacity. EXAM: CHEST - 2 VIEW COMPARISON:  None. FINDINGS: Normal heart size and mediastinal contours. No acute infiltrate or edema. Minimal scarring or atelectasis on the left. No effusion or pneumothorax. No acute osseous findings. IMPRESSION: No active cardiopulmonary disease. Electronically Signed   By: Marnee SpringJonathon  Watts M.D.   On: 10/03/2019 09:15   DG Ankle Complete Right  Result Date: 10/03/2019 CLINICAL DATA:  Insensate right foot and  ankle after drug use. EXAM: RIGHT ANKLE - COMPLETE 3+ VIEW COMPARISON:  None. FINDINGS: There is no evidence of fracture, dislocation, or joint effusion. There is no evidence of arthropathy or other focal bone abnormality. Soft tissues are unremarkable. IMPRESSION: Negative. Electronically Signed   By: Marnee SpringJonathon  Watts M.D.   On: 10/03/2019 09:15   MR Lumbar Spine W Wo Contrast  Result Date: 10/03/2019 CLINICAL DATA:  Acute left lower extremity numbness. Recent fall. Recent heroin use. EXAM: MRI LUMBAR SPINE WITHOUT AND WITH CONTRAST TECHNIQUE: Multiplanar and multiecho pulse sequences of the lumbar spine were obtained without and with intravenous contrast. CONTRAST:  7mL GADAVIST GADOBUTROL 1 MMOL/ML IV SOLN COMPARISON:  None. FINDINGS: Segmentation: 5 lumbar type vertebrae based on the available coverage and lowest ribs. Alignment:  Normal Vertebrae:  No fracture, evidence of discitis, or bone lesion. Conus medullaris and cauda equina: Conus extends to the L1 level. Conus and cauda equina appear normal. Paraspinal and other soft tissues: Edema and expansion of the right quadratus lumborum and deep intrinsic back muscles  on the right at the level of L2 at L3 primarily. There is partially covered deep bilateral gluteus muscular edema. There are areas of superimposed poor enhancement at the right quadratus lumborum and left gluteal musculature, but the internal architecture with fascicular appearance is maintained, more suggestive of muscle necrosis than abscess. Disc levels: No degenerative changes or discitis. IMPRESSION: 1. Muscular inflammation and probable necrosis in the right quadratus lumborum and adjacent intrinsic back muscles. Similar but partially covered changes are seen in the bilateral deep gluteal musculature. Given the history findings could relate to strain, pressure necrosis, or less likely infectious myositis. Need correlation for trauma, episode of unconsciousness, or bacteremia. 2. Concerning  the distal right leg weakness, inflammation surrounds the upper lumbar plexus and may be associated with plexopathy or nonvisualized sites of muscular inflammation. Electronically Signed   By: Monte Fantasia M.D.   On: 10/03/2019 11:50   DG Shoulder Left  Result Date: 10/03/2019 CLINICAL DATA:  Pain EXAM: LEFT SHOULDER - 2+ VIEW COMPARISON:  None. FINDINGS: There is no acute displaced fracture or dislocation. No radiopaque foreign body. There is a possible left mid lung zone airspace opacity which is suboptimally evaluated on this exam. IMPRESSION: 1. No acute displaced fracture or dislocation. No radiopaque foreign body. 2. Possible left mid lung zone airspace opacity. A dedicated chest radiograph is recommended. Electronically Signed   By: Constance Holster M.D.   On: 10/03/2019 03:34   DG Foot Complete Right  Result Date: 10/03/2019 CLINICAL DATA:  Insensate right foot and ankle. EXAM: RIGHT FOOT COMPLETE - 3+ VIEW COMPARISON:  None. FINDINGS: There is no evidence of fracture or dislocation. There is no evidence of arthropathy or other focal bone abnormality. Soft tissues are unremarkable. IMPRESSION: Negative. Electronically Signed   By: Monte Fantasia M.D.   On: 10/03/2019 09:13   DG Hip Unilat W or Wo Pelvis 1 View Left  Result Date: 10/03/2019 CLINICAL DATA:  Pain. EXAM: DG HIP (WITH OR WITHOUT PELVIS) 1V*L* COMPARISON:  None. FINDINGS: There is no evidence of hip fracture or dislocation. There is no evidence of arthropathy or other focal bone abnormality. IMPRESSION: Negative. Electronically Signed   By: Constance Holster M.D.   On: 10/03/2019 03:33   MR Lumbar Spine W Wo Contrast  Final Result    DG Foot Complete Right  Final Result    DG Ankle Complete Right  Final Result    DG Chest 2 View  Final Result    DG Shoulder Left  Final Result    DG Hip Unilat W or Wo Pelvis 1 View Left  Final Result      Consults called:  N/A  EKG: Independently reviewed. NSR; T waves  normal, P waves are depressed; QRS 82    Dwyane Dee, MD [facility] Pager: Secure chat via [place] pager # : [phone]  If 7PM-7AM, please contact night-coverage [email or web address] Use universal  password for that web site. If you do not have the password, please call the hospital operator.  10/03/2019, 2:59 PM

## 2019-10-03 NOTE — ED Notes (Signed)
[facility] called for transport. 

## 2019-10-03 NOTE — Assessment & Plan Note (Signed)
-  Patient endorsed snorting 2 rounds of heroin on 10/02/2019.  Monitor for signs of withdrawal -Cessation counseling offered on admission.  Patient apathetic at this time

## 2019-10-03 NOTE — Hospital Course (Signed)
Alex Potter is a 24 year old Caucasian male with PMH hyperlipidemia, homelessness who follows with [facility].  He typically is outside and sitting on the ground resting against buildings.  Yesterday, he states that he did "2 bumps of heroin".  He denies any IV drug use.  He then sat down on the concrete ground and laid against a cement building the rest of the day and night.  He then presented to the ER today due to "feeling bad". He has been homeless since approximately January after he was kicked out by his roommate. He endorses ongoing tobacco use, 1 PPD.  Ongoing alcohol use liquor and beer typically.  He would not elaborate as to quantity but states his last drink was on Friday. In the ER, UDS was positive for opiates and THC WBC 25.7, Hgb 18, HCT 58.4, platelets 255 Ethanol less than 10 UA: Large hemoglobin, negative RBCs, 100 protein CK 15,275 Na 125, K 7.5, Creat 2.62, BUN 29, AST 1753, ALT 827, TB 1.6 Tylenol level negative Hepatitis panel pending COVID-19 test negative  In the ER, he was treated with 1 g calcium gluconate, 5 units NovoLog insulin/D50, bicarb infusion, 2 L normal saline bolus.  He is being admitted for further treatment of rhabdomyolysis, renal failure, and elevated LFTs.  Given risk for further renal dysfunction, he will be transferred to [facility] for possible nephrology consult and/or need for dialysis if worsening renal function.  May also need GI consult if LFTs continue to escalate.  He is somnolent during interview but able to answer all questions appropriately.

## 2019-10-03 NOTE — Assessment & Plan Note (Signed)
-   nicotine patch while hospitalized

## 2019-10-03 NOTE — ED Notes (Signed)
Report given to [facility]. 

## 2019-10-04 ENCOUNTER — Inpatient Hospital Stay (HOSPITAL_COMMUNITY): Payer: BLUE CROSS/BLUE SHIELD

## 2019-10-04 LAB — CBC WITH DIFFERENTIAL/PLATELET
Abs Immature Granulocytes: 0.07 10*3/uL (ref 0.00–0.07)
Basophils Absolute: 0 10*3/uL (ref 0.0–0.1)
Basophils Relative: 0 %
Eosinophils Absolute: 0 10*3/uL (ref 0.0–0.5)
Eosinophils Relative: 0 %
HCT: 52.1 % — ABNORMAL HIGH (ref 39.0–52.0)
Hemoglobin: 16.5 g/dL (ref 13.0–17.0)
Immature Granulocytes: 0 %
Lymphocytes Relative: 17 %
Lymphs Abs: 2.9 10*3/uL (ref 0.7–4.0)
MCH: 25.5 pg — ABNORMAL LOW (ref 26.0–34.0)
MCHC: 31.7 g/dL (ref 30.0–36.0)
MCV: 80.7 fL (ref 80.0–100.0)
Monocytes Absolute: 1.4 10*3/uL — ABNORMAL HIGH (ref 0.1–1.0)
Monocytes Relative: 9 %
Neutro Abs: 12.5 10*3/uL — ABNORMAL HIGH (ref 1.7–7.7)
Neutrophils Relative %: 74 %
Platelets: 214 10*3/uL (ref 150–400)
RBC: 6.46 MIL/uL — ABNORMAL HIGH (ref 4.22–5.81)
RDW: 14.7 % (ref 11.5–15.5)
WBC: 16.9 10*3/uL — ABNORMAL HIGH (ref 4.0–10.5)
nRBC: 0 % (ref 0.0–0.2)

## 2019-10-04 LAB — COMPREHENSIVE METABOLIC PANEL WITH GFR
ALT: 626 U/L — ABNORMAL HIGH (ref 0–44)
AST: 1100 U/L — ABNORMAL HIGH (ref 15–41)
Albumin: 2.5 g/dL — ABNORMAL LOW (ref 3.5–5.0)
Alkaline Phosphatase: 80 U/L (ref 38–126)
Anion gap: 11 (ref 5–15)
BUN: 37 mg/dL — ABNORMAL HIGH (ref 6–20)
CO2: 23 mmol/L (ref 22–32)
Calcium: 8.1 mg/dL — ABNORMAL LOW (ref 8.9–10.3)
Chloride: 101 mmol/L (ref 98–111)
Creatinine, Ser: 3.74 mg/dL — ABNORMAL HIGH (ref 0.61–1.24)
GFR calc Af Amer: 25 mL/min — ABNORMAL LOW
GFR calc non Af Amer: 21 mL/min — ABNORMAL LOW
Glucose, Bld: 113 mg/dL — ABNORMAL HIGH (ref 70–99)
Potassium: 5.4 mmol/L — ABNORMAL HIGH (ref 3.5–5.1)
Sodium: 135 mmol/L (ref 135–145)
Total Bilirubin: 0.8 mg/dL (ref 0.3–1.2)
Total Protein: 5.4 g/dL — ABNORMAL LOW (ref 6.5–8.1)

## 2019-10-04 LAB — BASIC METABOLIC PANEL
Anion gap: 10 (ref 5–15)
Anion gap: 8 (ref 5–15)
BUN: 33 mg/dL — ABNORMAL HIGH (ref 6–20)
BUN: 42 mg/dL — ABNORMAL HIGH (ref 6–20)
CO2: 23 mmol/L (ref 22–32)
CO2: 25 mmol/L (ref 22–32)
Calcium: 8 mg/dL — ABNORMAL LOW (ref 8.9–10.3)
Calcium: 8.1 mg/dL — ABNORMAL LOW (ref 8.9–10.3)
Chloride: 105 mmol/L (ref 98–111)
Chloride: 99 mmol/L (ref 98–111)
Creatinine, Ser: 3.19 mg/dL — ABNORMAL HIGH (ref 0.61–1.24)
Creatinine, Ser: 4.48 mg/dL — ABNORMAL HIGH (ref 0.61–1.24)
GFR calc Af Amer: 20 mL/min — ABNORMAL LOW (ref >60)
GFR calc Af Amer: 30 mL/min — ABNORMAL LOW (ref >60)
GFR calc non Af Amer: 17 mL/min — ABNORMAL LOW (ref >60)
GFR calc non Af Amer: 26 mL/min — ABNORMAL LOW (ref >60)
Glucose, Bld: 104 mg/dL — ABNORMAL HIGH (ref 70–99)
Glucose, Bld: 121 mg/dL — ABNORMAL HIGH (ref 70–99)
Potassium: 5.6 mmol/L — ABNORMAL HIGH (ref 3.5–5.1)
Potassium: 5.8 mmol/L — ABNORMAL HIGH (ref 3.5–5.1)
Sodium: 134 mmol/L — ABNORMAL LOW (ref 135–145)
Sodium: 136 mmol/L (ref 135–145)

## 2019-10-04 LAB — HEPATITIS PANEL, ACUTE
HCV Ab: NONREACTIVE
Hep A IgM: NONREACTIVE
Hep B C IgM: NONREACTIVE
Hepatitis B Surface Ag: NONREACTIVE

## 2019-10-04 LAB — CK
Total CK: >50000 U/L — ABNORMAL HIGH (ref 49–397)
Total CK: 11098 U/L — ABNORMAL HIGH (ref 49–397)

## 2019-10-04 LAB — MAGNESIUM: Magnesium: 1.9 mg/dL (ref 1.7–2.4)

## 2019-10-04 MED ORDER — SODIUM ZIRCONIUM CYCLOSILICATE 10 G PO PACK
10.0000 g | PACK | Freq: Three times a day (TID) | ORAL | Status: AC
  Administered 2019-10-05: 10 g via ORAL
  Filled 2019-10-04 (×2): qty 1

## 2019-10-04 MED ORDER — SODIUM ZIRCONIUM CYCLOSILICATE 10 G PO PACK: 10.0000 g | PACK | Freq: Three times a day (TID) | ORAL | Status: DC

## 2019-10-04 MED ORDER — SODIUM CHLORIDE 0.9 % IV BOLUS
2000.0000 mL | Freq: Once | INTRAVENOUS | Status: AC
  Administered 2019-10-04: 2000 mL via INTRAVENOUS

## 2019-10-04 MED ORDER — PROMETHAZINE HCL 25 MG/ML IJ SOLN
12.5000 mg | Freq: Four times a day (QID) | INTRAMUSCULAR | Status: DC | PRN
  Administered 2019-10-04 – 2019-10-08 (×3): 12.5 mg via INTRAVENOUS
  Filled 2019-10-04 (×3): qty 1

## 2019-10-04 MED ORDER — ONDANSETRON HCL 4 MG/2ML IJ SOLN
4.0000 mg | Freq: Four times a day (QID) | INTRAMUSCULAR | Status: DC | PRN
  Administered 2019-10-04 – 2019-10-11 (×2): 4 mg via INTRAVENOUS
  Filled 2019-10-04 (×3): qty 2

## 2019-10-04 MED ORDER — SODIUM POLYSTYRENE SULFONATE 15 GM/60ML PO SUSP
30.0000 g | Freq: Once | ORAL | Status: DC
  Filled 2019-10-04: qty 120

## 2019-10-04 MED ORDER — SODIUM CHLORIDE 0.9 % IV SOLN: INTRAVENOUS | Status: DC

## 2019-10-04 MED ORDER — SODIUM POLYSTYRENE SULFONATE 15 GM/60ML PO SUSP
30.0000 g | Freq: Once | ORAL | Status: AC
  Administered 2019-10-04: 30 g via ORAL
  Filled 2019-10-04: qty 120

## 2019-10-04 MED ORDER — SODIUM POLYSTYRENE SULFONATE 15 GM/60ML PO SUSP
15.0000 g | Freq: Once | ORAL | Status: AC
  Administered 2019-10-04: 15 g via ORAL
  Filled 2019-10-04: qty 60

## 2019-10-04 NOTE — Consult Note (Addendum)
Renal Service Consult Note [facility]  Alex Potter 10/04/2019 Alex Potter Requesting Physician:  Dr Alex Potter  Reason for Consult:  Renal failure HPI: The patient is a 24 y.o. year-old w/ hx of drug/ opiate/ heroin abuse was found unresponsive outside of a gas station yesterday 5/22. He was monitored by EMS until he awakened and then c/o foot and ankle numbness so was taken to ED. Initial labs showed creat 2.62 w/ K+ 7.5. CPK 15275.  Pt was admitted and given generous IVF's. He voided 150 cc yest. Today creat is up to 3.74 in am and 4.48 in afternoon, and UOP today is 300 cc dark brown urine. We are asked to see for renal failure.   Pt denies hx of kidney disease or failure, no voiding issues, no SOB or CP.  Does c/o R leg/ thigh numbness and pain and L shoulder pain/ upper arm pain. Having some nausea after kayexalate.    Repeat K+ today is 5.8, taking another kayexalate dose now.   I/O since admit yesterday are 3.3 L in and 1000 cc out (includes 500 cc emesis).   Pt denies hx of any chronic medical illness or Rx medications.   ROS  denies CP  no joint pain   no HA  no blurry vision  no rash  no diarrhea   Past Medical History  Past Medical History:  Diagnosis Date  . Drug abuse (HCC)   . Tobacco abuse    Past Surgical History  Past Surgical History:  Procedure Laterality Date  . CHOLECYSTECTOMY N/A 07/05/2017   Procedure: LAPAROSCOPIC CHOLECYSTECTOMY WITH INTRAOPERATIVE CHOLANGIOGRAM;  Surgeon: Alex Norman, MD;  Location: [facility] OR;  Service: General;  Laterality: N/A;  . ERCP N/A 07/04/2017   Procedure: ENDOSCOPIC RETROGRADE CHOLANGIOPANCREATOGRAPHY (ERCP);  Surgeon: Alex Rist, MD;  [facility] ENDOSCOPY;  Service: Gastroenterology;  Laterality: N/A;  . INGUINAL HERNIA REPAIR Right    Family History History reviewed. No pertinent family history. Social History  reports that he has been smoking. He has never used smokeless tobacco. He reports  current alcohol use. He reports current drug use. Drugs: Marijuana and Methamphetamines. Allergies  Allergies  Allergen Reactions  . Mushroom Extract Complex Itching and Swelling   Home medications Prior to Admission medications   Medication Sig Start Date End Date Taking? Authorizing Provider  albuterol (VENTOLIN HFA) 108 (90 Base) MCG/ACT inhaler Inhale 1-2 puffs into the lungs every 6 (six) hours as needed for wheezing or shortness of breath.   Yes [provider]  oxymetazoline (AFRIN) 0.05 % nasal spray Place 2 sprays into both nostrils 2 (two) times daily as needed for congestion. Patient not taking: Reported on 09/23/2019 08/01/19   Alex Junker B, MD     Vitals:   10/04/19 0347 10/04/19 0726 10/04/19 1324 10/04/19 2040  BP: (!) 138/98 (!) 132/94 (!) 134/95 (!) 145/100  Pulse: 76 84 73 100  Resp:  16 16 18   Temp:  (!) 97.5 F (36.4 C) 97.7 F (36.5 C) 98.5 F (36.9 C)  TempSrc:  Oral Oral Oral  SpO2:  97% 100% 97%   Exam Gen small framed WM not in distress, nauseous holding emesis bag No rash, cyanosis or gangrene Sclera anicteric, throat clear  No jvd or bruits Chest clear bilat to bases RRR no MRG Abd soft ntnd no mass or ascites +bs GU normal male MS no joint effusions or deformity Ext mild-mod  edema of R leg and bilat UE's edema No  wounds or ulcers Neuro is alert, Ox 3 , nf    Home meds:  - none     UA 5/22 - cloudy, namber, large Hb, 100 prot, 0-5 rbc/ wbc     UDS +opiates/ thc    RUS 5/23 - 10.2/ 9.7 kidneys, no hydro, ^'d echo      Assessment/ Plan: 1. Acute renal failure - due to acute rhabdomyolysis related to intoxication/ drug use.  Creat rising, UOP is small.  No uremic signs at this time. Developing edema, will lower IVF's to 100 cc/hr at this time.  Plan is supportive care. Pt has ATN. Will follow.  2. Opioid abuse 3. Acute rhabdomyolysis 4. Hyperkalemia - changed to renal diet (low K+), getting kayexalate 2nd dose. Will add lokelma  from here. Get q 6 hr labs for now.       Alex Splinter  MD 10/04/2019, 8:55 PM  Recent Labs  Lab 10/03/19 0732 10/04/19 0403  WBC 25.7* 16.9*  HGB 18.0* 16.5   Recent Labs  Lab 10/04/19 0403 10/04/19 1516  K 5.4* 5.8*  BUN 37* 42*  CREATININE 3.74* 4.48*  CALCIUM 8.1* 8.0*

## 2019-10-04 NOTE — Plan of Care (Signed)

## 2019-10-04 NOTE — Progress Notes (Signed)
[facility] PROGRESS NOTE    Progress Note  Alex Potter  EXH:[id number] DOB: 10-19-1995 DOA: 10/03/2019 PCP: Patient, No Pcp Per     Brief Narrative:   Alex Potter is an 24 y.o. male past medical history of hyperlipidemia homeless who is followed [facility] comes into the ED for feeling bad, UDS was positive for cannabis with a white count of 25 platelet count of 255 and a CK of 15,000. She relates he is noted some cocaine and had multiple beers and that he passed out he does not know how much time he was out for maybe 6 to 12 hours.  Assessment/Plan:   Nontraumatic  rhabdomyolysis -MRI of the lumbar spine was showed that showed necrosis of muscle. -CK on admission greater than 15,000.  Repeated CK this morning is greater than 50,000. We will bolus him with normal saline and continue normal saline infusion at 200.  Acute kidney injury: With a baseline creatinine of stent 1 on admission to, and has increased to 3. We will bolus him 2 L of normal saline continue 200 cc an hour.  Hyperkalemia: With potassium of 7.5, repeat a BMP this morning showed potassium of 5.6. He is currently asymptomatic he was given Lasix Kayexalate, calcium gluconate insulin and bicarbonate. His calcium is trending down we will continue aggressive IV fluid hydration.  Polysubstance abuse: He endorses snorting heroin. Counseling.  Elevated LFTs: AST greater than ALT likely due to rhabdomyolysis they are improving, continue aggressive IV fluid hydration.  Leukocytosis He denies any IV drug abuse, blood cultures have been ordered, has remained afebrile leukocytosis improving. We will continue to hold IV antibiotics.  Nausea vomiting: Likely due to rhabdomyolysis start Zofran and Phenergan encourage oral intake.  DVT prophylaxis: heparin Family Communication:none Status is: Inpatient  Remains inpatient appropriate because:Hemodynamically unstable   Dispo: The patient is from:  Home              Anticipated d/c is to: Home              Anticipated d/c date is: > 3 days              Patient currently is not medically stable to d/c.    ode Status:     Code Status Orders  (From admission, onward)         Start     Ordered   10/03/19 1624  Full code  Continuous     10/03/19 1623        Code Status History    Date Active Date Inactive Code Status Order ID Comments User Context   07/30/2019 0437 08/01/2019 1731 Full Code [id number]  Carollee Herter, DO ED   11/03/2017 1937 11/07/2017 1738 Full Code [id number]  Tobey Grim, NP Inpatient   11/03/2017 1609 11/03/2017 1937 Full Code [id number]  Noralee Stain, DO Inpatient   07/03/2017 2327 07/09/2017 1842 Full Code [id number]  Lorretta Harp, MD ED   Advance Care Planning Activity        IV Access:    Peripheral IV   Procedures and diagnostic studies:   DG Chest 2 View  Result Date: 10/03/2019 CLINICAL DATA:  Possible left lung opacity. EXAM: CHEST - 2 VIEW COMPARISON:  None. FINDINGS: Normal heart size and mediastinal contours. No acute infiltrate or edema. Minimal scarring or atelectasis on the left. No effusion or pneumothorax. No acute osseous findings. IMPRESSION: No active cardiopulmonary disease. Electronically Signed   By: Kathrynn Ducking.D.  On: 10/03/2019 09:15   DG Ankle Complete Right  Result Date: 10/03/2019 CLINICAL DATA:  Insensate right foot and ankle after drug use. EXAM: RIGHT ANKLE - COMPLETE 3+ VIEW COMPARISON:  None. FINDINGS: There is no evidence of fracture, dislocation, or joint effusion. There is no evidence of arthropathy or other focal bone abnormality. Soft tissues are unremarkable. IMPRESSION: Negative. Electronically Signed   By: Monte Fantasia M.D.   On: 10/03/2019 09:15   MR Lumbar Spine W Wo Contrast  Result Date: 10/03/2019 CLINICAL DATA:  Acute left lower extremity numbness. Recent fall. Recent heroin use. EXAM: MRI LUMBAR SPINE WITHOUT AND WITH CONTRAST TECHNIQUE:  Multiplanar and multiecho pulse sequences of the lumbar spine were obtained without and with intravenous contrast. CONTRAST:  90mL GADAVIST GADOBUTROL 1 MMOL/ML IV SOLN COMPARISON:  None. FINDINGS: Segmentation: 5 lumbar type vertebrae based on the available coverage and lowest ribs. Alignment:  Normal Vertebrae:  No fracture, evidence of discitis, or bone lesion. Conus medullaris and cauda equina: Conus extends to the L1 level. Conus and cauda equina appear normal. Paraspinal and other soft tissues: Edema and expansion of the right quadratus lumborum and deep intrinsic back muscles on the right at the level of L2 at L3 primarily. There is partially covered deep bilateral gluteus muscular edema. There are areas of superimposed poor enhancement at the right quadratus lumborum and left gluteal musculature, but the internal architecture with fascicular appearance is maintained, more suggestive of muscle necrosis than abscess. Disc levels: No degenerative changes or discitis. IMPRESSION: 1. Muscular inflammation and probable necrosis in the right quadratus lumborum and adjacent intrinsic back muscles. Similar but partially covered changes are seen in the bilateral deep gluteal musculature. Given the history findings could relate to strain, pressure necrosis, or less likely infectious myositis. Need correlation for trauma, episode of unconsciousness, or bacteremia. 2. Concerning the distal right leg weakness, inflammation surrounds the upper lumbar plexus and may be associated with plexopathy or nonvisualized sites of muscular inflammation. Electronically Signed   By: Monte Fantasia M.D.   On: 10/03/2019 11:50   DG Shoulder Left  Result Date: 10/03/2019 CLINICAL DATA:  Pain EXAM: LEFT SHOULDER - 2+ VIEW COMPARISON:  None. FINDINGS: There is no acute displaced fracture or dislocation. No radiopaque foreign body. There is a possible left mid lung zone airspace opacity which is suboptimally evaluated on this exam.  IMPRESSION: 1. No acute displaced fracture or dislocation. No radiopaque foreign body. 2. Possible left mid lung zone airspace opacity. A dedicated chest radiograph is recommended. Electronically Signed   By: Constance Holster M.D.   On: 10/03/2019 03:34   DG Foot Complete Right  Result Date: 10/03/2019 CLINICAL DATA:  Insensate right foot and ankle. EXAM: RIGHT FOOT COMPLETE - 3+ VIEW COMPARISON:  None. FINDINGS: There is no evidence of fracture or dislocation. There is no evidence of arthropathy or other focal bone abnormality. Soft tissues are unremarkable. IMPRESSION: Negative. Electronically Signed   By: Monte Fantasia M.D.   On: 10/03/2019 09:13   DG Hip Unilat W or Wo Pelvis 1 View Left  Result Date: 10/03/2019 CLINICAL DATA:  Pain. EXAM: DG HIP (WITH OR WITHOUT PELVIS) 1V*L* COMPARISON:  None. FINDINGS: There is no evidence of hip fracture or dislocation. There is no evidence of arthropathy or other focal bone abnormality. IMPRESSION: Negative. Electronically Signed   By: Constance Holster M.D.   On: 10/03/2019 03:33     Medical Consultants:    None.  Anti-Infectives:   None  Subjective:  Alex Potter he relates he continues to have right quad pain and gluteal pain  Objective:    Vitals:   10/04/19 0000 10/04/19 0251 10/04/19 0347 10/04/19 0726  BP: (!) 136/98 (!) 141/100 (!) 138/98 (!) 132/94  Pulse: 94 88 76 84  Resp: 16 16  16   Temp: 98 F (36.7 C) (!) 97.5 F (36.4 C)  (!) 97.5 F (36.4 C)  TempSrc: Oral Oral  Oral  SpO2: 99% 98%  97%   SpO2: 97 % O2 Flow Rate (L/min): 2 L/min   Intake/Output Summary (Last 24 hours) at 10/04/2019 0915 Last data filed at 10/04/2019 0427 Gross per 24 hour  Intake 2109.47 ml  Output 400 ml  Net 1709.47 ml   There were no vitals filed for this visit.  Exam: General exam: In no acute distress. Respiratory system: Good air movement and clear to auscultation. Cardiovascular system: S1 & S2 heard, RRR. No JVD.  Gastrointestinal system: Abdomen is nondistended, soft and nontender.  Extremities: No pedal edema. Skin: No rashes, lesions or ulcers  Data Reviewed:    Labs: Basic Metabolic Panel: Recent Labs  Lab 10/03/19 0855 10/03/19 0855 10/03/19 1028 10/03/19 1028 10/03/19 2344 10/04/19 0403  NA 137  --  135  --  134* 135  K SPECIMEN HEMOLYZED. HEMOLYSIS MAY AFFECT INTEGRITY OF RESULTS.   < > 7.5*   < > 5.6* 5.4*  CL 105  --  103  --  99 101  CO2 21*  --  20*  --  25 23  GLUCOSE 97  --  99  --  121* 113*  BUN 27*  --  29*  --  33* 37*  CREATININE 2.42*  --  2.62*  --  3.19* 3.74*  CALCIUM 7.7*  --  7.9*  --  8.1* 8.1*  MG  --   --   --   --   --  1.9   < > = values in this interval not displayed.   GFR Estimated Creatinine Clearance: 29.8 mL/min (A) (by C-G formula based on SCr of 3.74 mg/dL (H)). Liver Function Tests: Recent Labs  Lab 10/03/19 0855 10/03/19 1028 10/04/19 0403  AST 1,659* 1,753* 1,100*  ALT 758* 827* 626*  ALKPHOS 100 106 80  BILITOT 1.6* 1.6* 0.8  PROT 7.4 8.0 5.4*  ALBUMIN 3.7 3.8 2.5*   No results for input(s): LIPASE, AMYLASE in the last 168 hours. No results for input(s): AMMONIA in the last 168 hours. Coagulation profile No results for input(s): INR, PROTIME in the last 168 hours. COVID-19 Labs  No results for input(s): DDIMER, FERRITIN, LDH, CRP in the last 72 hours.  Lab Results  Component Value Date   SARSCOV2NAA NEGATIVE 10/03/2019   SARSCOV2NAA NEGATIVE 07/30/2019    CBC: Recent Labs  Lab 10/03/19 0732 10/04/19 0403  WBC 25.7* 16.9*  NEUTROABS 21.7* 12.5*  HGB 18.0* 16.5  HCT 58.4* 52.1*  MCV 84.8 80.7  PLT 255 214   Cardiac Enzymes: Recent Labs  Lab 10/03/19 0855 10/03/19 1028 10/04/19 0403  CKTOTAL SPECIMEN HEMOLYZED. HEMOLYSIS MAY AFFECT INTEGRITY OF RESULTS. 15,275* >50,000*   BNP (last 3 results) No results for input(s): PROBNP in the last 8760 hours. CBG: Recent Labs  Lab 10/03/19 1319  GLUCAP 181*    D-Dimer: No results for input(s): DDIMER in the last 72 hours. Hgb A1c: No results for input(s): HGBA1C in the last 72 hours. Lipid Profile: No results for input(s): CHOL, HDL, LDLCALC, TRIG, CHOLHDL, LDLDIRECT in the last  72 hours. Thyroid function studies: No results for input(s): TSH, T4TOTAL, T3FREE, THYROIDAB in the last 72 hours.  Invalid input(s): FREET3 Anemia work up: No results for input(s): VITAMINB12, FOLATE, FERRITIN, TIBC, IRON, RETICCTPCT in the last 72 hours. Sepsis Labs: Recent Labs  Lab 10/03/19 0732 10/04/19 0403  WBC 25.7* 16.9*   Microbiology Recent Results (from the past 240 hour(s))  SARS Coronavirus 2 by RT PCR (hospital order, performed in [facility] hospital lab) Nasopharyngeal Nasopharyngeal Swab     Status: None   Collection Time: 10/03/19 12:53 PM   Specimen: Nasopharyngeal Swab  Result Value Ref Range Status   SARS Coronavirus 2 NEGATIVE NEGATIVE Final    Comment: (NOTE) SARS-CoV-2 target nucleic acids are NOT DETECTED. The SARS-CoV-2 RNA is generally detectable in upper and lower respiratory specimens during the acute phase of infection. The lowest concentration of SARS-CoV-2 viral copies this assay can detect is 250 copies / mL. A negative result does not preclude SARS-CoV-2 infection and should not be used as the sole basis for treatment or other patient management decisions.  A negative result may occur with improper specimen collection / handling, submission of specimen other than nasopharyngeal swab, presence of viral mutation(s) within the areas targeted by this assay, and inadequate number of viral copies (<250 copies / mL). A negative result must be combined with clinical observations, patient history, and epidemiological information. Fact Sheet for Patients:   BoilerBrush.com.cy Fact Sheet for Healthcare Providers: https://pope.com/ This test is not yet approved or cleared  by the  [place] FDA and has been authorized for detection and/or diagnosis of SARS-CoV-2 by FDA under an Emergency Use Authorization (EUA).  This EUA will remain in effect (meaning this test can be used) for the duration of the COVID-19 declaration under Section 564(b)(1) of the Act, 21 U.S.C. section 360bbb-3(b)(1), unless the authorization is terminated or revoked sooner. Performed at [facility], [address], [place], [place] [address]      Medications:   . heparin  5,000 Units Subcutaneous Q8H  . nicotine  14 mg Transdermal Daily  . sodium chloride flush  3 mL Intravenous Q12H   Continuous Infusions: . sodium chloride 200 mL/hr at 10/04/19 0725      LOS: 1 day   Marinda Elk  [facility]  10/04/2019, 9:15 AM

## 2019-10-05 ENCOUNTER — Other Ambulatory Visit: Payer: Self-pay

## 2019-10-05 LAB — RENAL FUNCTION PANEL
Albumin: 2.3 g/dL — ABNORMAL LOW (ref 3.5–5.0)
Albumin: 2.5 g/dL — ABNORMAL LOW (ref 3.5–5.0)
Anion gap: 11 (ref 5–15)
Anion gap: 11 (ref 5–15)
BUN: 43 mg/dL — ABNORMAL HIGH (ref 6–20)
BUN: 48 mg/dL — ABNORMAL HIGH (ref 6–20)
CO2: 22 mmol/L (ref 22–32)
CO2: 19 mmol/L — ABNORMAL LOW (ref 22–32)
Calcium: 8 mg/dL — ABNORMAL LOW (ref 8.9–10.3)
Calcium: 8 mg/dL — ABNORMAL LOW (ref 8.9–10.3)
Chloride: 103 mmol/L (ref 98–111)
Chloride: 104 mmol/L (ref 98–111)
Creatinine, Ser: 5.25 mg/dL — ABNORMAL HIGH (ref 0.61–1.24)
Creatinine, Ser: 6.08 mg/dL — ABNORMAL HIGH (ref 0.61–1.24)
GFR calc Af Amer: 16 mL/min — ABNORMAL LOW (ref >60)
GFR calc Af Amer: 14 mL/min — ABNORMAL LOW (ref >60)
GFR calc non Af Amer: 14 mL/min — ABNORMAL LOW (ref >60)
GFR calc non Af Amer: 12 mL/min — ABNORMAL LOW (ref >60)
Glucose, Bld: 83 mg/dL (ref 70–99)
Glucose, Bld: 84 mg/dL (ref 70–99)
Phosphorus: 5.8 mg/dL — ABNORMAL HIGH (ref 2.5–4.6)
Phosphorus: 5.9 mg/dL — ABNORMAL HIGH (ref 2.5–4.6)
Potassium: 5 mmol/L (ref 3.5–5.1)
Potassium: 4.8 mmol/L (ref 3.5–5.1)
Sodium: 136 mmol/L (ref 135–145)
Sodium: 134 mmol/L — ABNORMAL LOW (ref 135–145)

## 2019-10-05 LAB — CK: Total CK: 48454 U/L — ABNORMAL HIGH (ref 49–397)

## 2019-10-05 MED ORDER — SODIUM POLYSTYRENE SULFONATE 15 GM/60ML PO SUSP
30.0000 g | Freq: Once | ORAL | Status: AC
  Administered 2019-10-05: 30 g via ORAL
  Filled 2019-10-05: qty 120

## 2019-10-05 MED ORDER — SODIUM CHLORIDE 0.9 % IV BOLUS
500.0000 mL | Freq: Once | INTRAVENOUS | Status: AC
  Administered 2019-10-05: 500 mL via INTRAVENOUS

## 2019-10-05 NOTE — Progress Notes (Signed)
[facility] PROGRESS NOTE    Progress Note  Alex Potter  ZOX:[id number] DOB: January 10, 1996 DOA: 10/03/2019 PCP: Patient, No Pcp Per     Brief Narrative:   Alex Potter is an 24 y.o. male past medical history of hyperlipidemia homeless who is followed [facility] comes into the ED for feeling bad, UDS was positive for cannabis with a white count of 25 platelet count of 255 and a CK of 15,000. She relates he is noted some cocaine and had multiple beers and that he passed out he does not know how much time he was out for maybe 6 to 12 hours.  Assessment/Plan:   Nontraumatic  rhabdomyolysis -MRI of the lumbar spine was showed that showed necrosis of muscle. CK is back to 50,000 we had to decrease the fluid as he appears fluid overloaded on physical exam. His saturations have remained stable continue IV fluids recheck CK in the morning.  ATN likely due to rhabdomyolysis With a baseline creatinine of less than 1 on admission 3 after IV fluid hydration, his creatinine continues to climb he has had minimal urine output only about 200 cc urine. Nephrology was consulted, recommended to continue further management will With a baseline creatinine of stent 1 on admission to, and has increased to 3. We will bolus him 2 L of normal saline continue 200 cc an hour.  Hyperkalemia: Due to doses of Kayexalate her potassium this morning is 5.0 bicarb is 23.  Polysubstance abuse: He endorses snorting heroin. Counseling.  Elevated LFTs: AST greater than ALT likely due to rhabdomyolysis they are improving, continue aggressive IV fluid hydration.  Leukocytosis He denies any IV drug abuse blood cultures have remained negative till date. Continue to hold IV antibiotics CBC this morning is pending.  Nausea vomiting: Controlled, continue Zofran.  DVT prophylaxis: heparin Family Communication:none Status is: Inpatient  Remains inpatient appropriate because:Hemodynamically  unstable   Dispo: The patient is from: Home              Anticipated d/c is to: Home              Anticipated d/c date is: > 3 days              Patient currently is not medically stable to d/c.    ode Status:     Code Status Orders  (From admission, onward)         Start     Ordered   10/03/19 1624  Full code  Continuous     10/03/19 1623        Code Status History    Date Active Date Inactive Code Status Order ID Comments User Context   07/30/2019 0437 08/01/2019 1731 Full Code [id number]  Carollee Herter, DO ED   11/03/2017 1937 11/07/2017 1738 Full Code [id number]  Tobey Grim, NP Inpatient   11/03/2017 1609 11/03/2017 1937 Full Code [id number]  Noralee Stain, DO Inpatient   07/03/2017 2327 07/09/2017 1842 Full Code [id number]  Lorretta Harp, MD ED   Advance Care Planning Activity        IV Access:    Peripheral IV   Procedures and diagnostic studies:   MR Lumbar Spine W Wo Contrast  Result Date: 10/03/2019 CLINICAL DATA:  Acute left lower extremity numbness. Recent fall. Recent heroin use. EXAM: MRI LUMBAR SPINE WITHOUT AND WITH CONTRAST TECHNIQUE: Multiplanar and multiecho pulse sequences of the lumbar spine were obtained without and with intravenous contrast. CONTRAST:  19mL GADAVIST GADOBUTROL 1  MMOL/ML IV SOLN COMPARISON:  None. FINDINGS: Segmentation: 5 lumbar type vertebrae based on the available coverage and lowest ribs. Alignment:  Normal Vertebrae:  No fracture, evidence of discitis, or bone lesion. Conus medullaris and cauda equina: Conus extends to the L1 level. Conus and cauda equina appear normal. Paraspinal and other soft tissues: Edema and expansion of the right quadratus lumborum and deep intrinsic back muscles on the right at the level of L2 at L3 primarily. There is partially covered deep bilateral gluteus muscular edema. There are areas of superimposed poor enhancement at the right quadratus lumborum and left gluteal musculature, but the internal architecture  with fascicular appearance is maintained, more suggestive of muscle necrosis than abscess. Disc levels: No degenerative changes or discitis. IMPRESSION: 1. Muscular inflammation and probable necrosis in the right quadratus lumborum and adjacent intrinsic back muscles. Similar but partially covered changes are seen in the bilateral deep gluteal musculature. Given the history findings could relate to strain, pressure necrosis, or less likely infectious myositis. Need correlation for trauma, episode of unconsciousness, or bacteremia. 2. Concerning the distal right leg weakness, inflammation surrounds the upper lumbar plexus and may be associated with plexopathy or nonvisualized sites of muscular inflammation. Electronically Signed   By: Marnee Spring M.D.   On: 10/03/2019 11:50   US RENAL  Result Date: 10/04/2019 CLINICAL DATA:  Annual Rea EXAM: RENAL / URINARY TRACT ULTRASOUND COMPLETE COMPARISON:  None. FINDINGS: Right Kidney: Renal measurements: 10.2 x 5.6 x 5.5 cm = volume: 164.7 mL. Increased cortical echogenicity. Left Kidney: Renal measurements: 9.7 x 5.1 x 5.0 cm = volume: 128.9 mL. Increased cortical echogenicity. Bladder: The bladder wall is thick and hyperemic. Other: None. IMPRESSION: 1. Increased cortical echogenicity bilaterally is consistent with medical renal disease. No hydronephrosis. 2. The bladder wall is thickened and hyperemic. This is nonspecific but could be due to cystitis. Recommend clinical correlation and correlation with urinalysis. Electronically Signed   By: Gerome Sam III M.D   On: 10/04/2019 20:30     Medical Consultants:    None.  Anti-Infectives:   None  Subjective:    [name] continues to have pain.  Objective:    Vitals:   10/04/19 1324 10/04/19 2040 10/05/19 0411 10/05/19 0749  BP: (!) 134/95 (!) 145/100 (!) 147/96 (!) 132/95  Pulse: 73 100 76 62  Resp: 16 18 18 16   Temp: 97.7 F (36.5 C) 98.5 F (36.9 C) 97.9 F (36.6 C) (!) 97.3 F  (36.3 C)  TempSrc: Oral Oral Oral Oral  SpO2: 100% 97% 100% 99%   SpO2: 99 % O2 Flow Rate (L/min): 2 L/min   Intake/Output Summary (Last 24 hours) at 10/05/2019 0959 Last data filed at 10/04/2019 1900 Gross per 24 hour  Intake 960 ml  Output 450 ml  Net 510 ml   There were no vitals filed for this visit.  Exam: General exam: In no acute distress. Respiratory system: Good air movement and clear to auscultation. Cardiovascular system: S1 & S2 heard, RRR. No JVD. Gastrointestinal system: Abdomen is nondistended, soft and nontender.  Extremities: No pedal edema. Skin: No rashes, lesions or ulcers  Data Reviewed:    Labs: Basic Metabolic Panel: Recent Labs  Lab 10/03/19 1028 10/03/19 1028 10/03/19 2344 10/03/19 2344 10/04/19 0403 10/04/19 0403 10/04/19 1516 10/05/19 0006  NA 135  --  134*  --  135  --  136 136  K 7.5*   < > 5.6*   < > 5.4*   < > 5.8*  5.0  CL 103  --  99  --  101  --  105 103  CO2 20*  --  25  --  23  --  23 22  GLUCOSE 99  --  121*  --  113*  --  104* 83  BUN 29*  --  33*  --  37*  --  42* 43*  CREATININE 2.62*  --  3.19*  --  3.74*  --  4.48* 5.25*  CALCIUM 7.9*  --  8.1*  --  8.1*  --  8.0* 8.0*  MG  --   --   --   --  1.9  --   --   --   PHOS  --   --   --   --   --   --   --  5.8*   < > = values in this interval not displayed.   GFR Estimated Creatinine Clearance: 21.3 mL/min (A) (by C-G formula based on SCr of 5.25 mg/dL (H)). Liver Function Tests: Recent Labs  Lab 10/03/19 0855 10/03/19 1028 10/04/19 0403 10/05/19 0006  AST 1,659* 1,753* 1,100*  --   ALT 758* 827* 626*  --   ALKPHOS 100 106 80  --   BILITOT 1.6* 1.6* 0.8  --   PROT 7.4 8.0 5.4*  --   ALBUMIN 3.7 3.8 2.5* 2.3*   No results for input(s): LIPASE, AMYLASE in the last 168 hours. No results for input(s): AMMONIA in the last 168 hours. Coagulation profile No results for input(s): INR, PROTIME in the last 168 hours. COVID-19 Labs  No results for input(s): DDIMER,  FERRITIN, LDH, CRP in the last 72 hours.  Lab Results  Component Value Date   SARSCOV2NAA NEGATIVE 10/03/2019   [place] NEGATIVE 07/30/2019    CBC: Recent Labs  Lab 10/03/19 0732 10/04/19 0403  WBC 25.7* 16.9*  NEUTROABS 21.7* 12.5*  HGB 18.0* 16.5  HCT 58.4* 52.1*  MCV 84.8 80.7  PLT 255 214   Cardiac Enzymes: Recent Labs  Lab 10/03/19 0855 10/03/19 1028 10/04/19 0403 10/04/19 1516 10/05/19 0006  CKTOTAL SPECIMEN HEMOLYZED. HEMOLYSIS MAY AFFECT INTEGRITY OF RESULTS. 15,275* >50,000* 11,098* 48,454*   BNP (last 3 results) No results for input(s): PROBNP in the last 8760 hours. CBG: Recent Labs  Lab 10/03/19 1319  GLUCAP 181*   D-Dimer: No results for input(s): DDIMER in the last 72 hours. Hgb A1c: No results for input(s): HGBA1C in the last 72 hours. Lipid Profile: No results for input(s): CHOL, HDL, LDLCALC, TRIG, CHOLHDL, LDLDIRECT in the last 72 hours. Thyroid function studies: No results for input(s): TSH, T4TOTAL, T3FREE, THYROIDAB in the last 72 hours.  Invalid input(s): FREET3 Anemia work up: No results for input(s): VITAMINB12, FOLATE, FERRITIN, TIBC, IRON, RETICCTPCT in the last 72 hours. Sepsis Labs: Recent Labs  Lab 10/03/19 0732 10/04/19 0403  WBC 25.7* 16.9*   Microbiology Recent Results (from the past 240 hour(s))  Culture, blood (routine x 2)     Status: None (Preliminary result)   Collection Time: 10/03/19  7:32 AM   Specimen: BLOOD  Result Value Ref Range Status   Specimen Description   Final    BLOOD LEFT ANTECUBITAL Performed at [facility], [place] [address], [place], [place] [address]    Special Requests   Final    BOTTLES DRAWN AEROBIC AND ANAEROBIC Blood Culture adequate volume Performed at [place] [address] [place], [place] [address]    Culture  Final    NO GROWTH 2 DAYS Performed at [facility], [address] [place], [place] [address]    Report Status  PENDING  Incomplete  SARS Coronavirus 2 by RT PCR (hospital order, performed in [facility] hospital lab) Nasopharyngeal Nasopharyngeal Swab     Status: None   Collection Time: 10/03/19 12:53 PM   Specimen: Nasopharyngeal Swab  Result Value Ref Range Status   SARS Coronavirus 2 NEGATIVE NEGATIVE Final    Comment: (NOTE) SARS-CoV-2 target nucleic acids are NOT DETECTED. The SARS-CoV-2 RNA is generally detectable in upper and lower respiratory specimens during the acute phase of infection. The lowest concentration of SARS-CoV-2 viral copies this assay can detect is 250 copies / mL. A negative result does not preclude SARS-CoV-2 infection and should not be used as the sole basis for treatment or other patient management decisions.  A negative result may occur with improper specimen collection / handling, submission of specimen other than nasopharyngeal swab, presence of viral mutation(s) within the areas targeted by this assay, and inadequate number of viral copies (<250 copies / mL). A negative result must be combined with clinical observations, patient history, and epidemiological information. Fact Sheet for Patients:   BoilerBrush.com.cy Fact Sheet for Healthcare Providers: https://pope.com/ This test is not yet approved or cleared  by the [place] FDA and has been authorized for detection and/or diagnosis of SARS-CoV-2 by FDA under an Emergency Use Authorization (EUA).  This EUA will remain in effect (meaning this test can be used) for the duration of the COVID-19 declaration under Section 564(b)(1) of the Act, 21 U.S.C. section 360bbb-3(b)(1), unless the authorization is terminated or revoked sooner. Performed at [facility], [address], [place], [place] [address]      Medications:   . heparin  5,000 Units Subcutaneous Q8H  . nicotine  14 mg Transdermal Daily  . sodium chloride flush  3 mL  Intravenous Q12H  . sodium zirconium cyclosilicate  10 g Oral TID   Continuous Infusions: . sodium chloride 100 mL/hr at 10/04/19 2252      LOS: 2 days   Marinda Elk  [facility]  10/05/2019, 9:59 AM

## 2019-10-05 NOTE — Plan of Care (Signed)
  Problem: Education: Goal: Knowledge of General Education information will improve Description: Including pain rating scale, medication(s)/side effects and non-pharmacologic comfort measures Outcome: Progressing   Problem: Activity: Goal: Risk for activity intolerance will decrease Outcome: Progressing   

## 2019-10-05 NOTE — Progress Notes (Signed)
[facility] Progress Note   Subjective:   Says feels edematous with mild orthopnea.  No uremic symptoms.  Says homeless. I spoke with his father [phone] who is a psychiatrist in [facility] at his request.  He understands the severity of the issues currently.   Objective Vitals:   10/04/19 1324 10/04/19 2040 10/05/19 0411 10/05/19 0749  BP: (!) 134/95 (!) 145/100 (!) 147/96 (!) 132/95  Pulse: 73 100 76 62  Resp: 16 18 18 16   Temp: 97.7 F (36.5 C) 98.5 F (36.9 C) 97.9 F (36.6 C) (!) 97.3 F (36.3 C)  TempSrc: Oral Oral Oral Oral  SpO2: 100% 97% 100% 99%   Physical Exam General: no distress Heart: no rub, RRR Lungs: a few rales in bases, normal WOB at rest Abdomen: soft, nontender Extremities: 2+ DPP  Neuro: conversant  Additional Objective Labs: Basic Metabolic Panel: Recent Labs  Lab 10/04/19 0403 10/04/19 1516 10/05/19 0006  NA 135 136 136  K 5.4* 5.8* 5.0  CL 101 105 103  CO2 23 23 22   GLUCOSE 113* 104* 83  BUN 37* 42* 43*  CREATININE 3.74* 4.48* 5.25*  CALCIUM 8.1* 8.0* 8.0*  PHOS  --   --  5.8*   Liver Function Tests: Recent Labs  Lab 10/03/19 0855 10/03/19 0855 10/03/19 1028 10/04/19 0403 10/05/19 0006  AST 1,659*  --  1,753* 1,100*  --   ALT 758*  --  827* 626*  --   ALKPHOS 100  --  106 80  --   BILITOT 1.6*  --  1.6* 0.8  --   PROT 7.4  --  8.0 5.4*  --   ALBUMIN 3.7   < > 3.8 2.5* 2.3*   < > = values in this interval not displayed.   No results for input(s): LIPASE, AMYLASE in the last 168 hours. CBC: Recent Labs  Lab 10/03/19 0732 10/04/19 0403  WBC 25.7* 16.9*  NEUTROABS 21.7* 12.5*  HGB 18.0* 16.5  HCT 58.4* 52.1*  MCV 84.8 80.7  PLT 255 214   Blood Culture    Component Value Date/Time   SDES  10/03/2019 0732    BLOOD LEFT ANTECUBITAL Performed at [facility], [address] [place], [place]    SPECREQUEST  10/03/2019 0732    BOTTLES DRAWN AEROBIC AND ANAEROBIC Blood Culture  adequate volume Performed at [facility], [address] [place], [place]    CULT  10/03/2019 0732    NO GROWTH 2 DAYS Performed at [facility], [address], [place], [address] [place]    REPTSTATUS PENDING 10/03/2019 0732    Cardiac Enzymes: Recent Labs  Lab 10/03/19 0855 10/03/19 1028 10/04/19 0403 10/04/19 1516 10/05/19 0006  CKTOTAL SPECIMEN HEMOLYZED. HEMOLYSIS MAY AFFECT INTEGRITY OF RESULTS. 15,275* >50,000* 11,098* 48,454*   CBG: Recent Labs  Lab 10/03/19 1319  GLUCAP 181*   Iron Studies: No results for input(s): IRON, TIBC, TRANSFERRIN, FERRITIN in the last 72 hours. @lablastinr3 @ Studies/Results: MR Lumbar Spine W Wo Contrast  Result Date: 10/03/2019 CLINICAL DATA:  Acute left lower extremity numbness. Recent fall. Recent heroin use. EXAM: MRI LUMBAR SPINE WITHOUT AND WITH CONTRAST TECHNIQUE: Multiplanar and multiecho pulse sequences of the lumbar spine were obtained without and with intravenous contrast. CONTRAST:  10mL GADAVIST GADOBUTROL 1 MMOL/ML IV SOLN COMPARISON:  None. FINDINGS: Segmentation: 5 lumbar type vertebrae based on the available coverage and lowest ribs. Alignment:  Normal Vertebrae:  No fracture, evidence of  discitis, or bone lesion. Conus medullaris and cauda equina: Conus extends to the L1 level. Conus and cauda equina appear normal. Paraspinal and other soft tissues: Edema and expansion of the right quadratus lumborum and deep intrinsic back muscles on the right at the level of L2 at L3 primarily. There is partially covered deep bilateral gluteus muscular edema. There are areas of superimposed poor enhancement at the right quadratus lumborum and left gluteal musculature, but the internal architecture with fascicular appearance is maintained, more suggestive of muscle necrosis than abscess. Disc levels: No degenerative changes or discitis. IMPRESSION: 1. Muscular inflammation and probable necrosis in the right  quadratus lumborum and adjacent intrinsic back muscles. Similar but partially covered changes are seen in the bilateral deep gluteal musculature. Given the history findings could relate to strain, pressure necrosis, or less likely infectious myositis. Need correlation for trauma, episode of unconsciousness, or bacteremia. 2. Concerning the distal right leg weakness, inflammation surrounds the upper lumbar plexus and may be associated with plexopathy or nonvisualized sites of muscular inflammation. Electronically Signed   By: Monte Fantasia M.D.   On: 10/03/2019 11:50   US RENAL  Result Date: 10/04/2019 CLINICAL DATA:  Annual Rea EXAM: RENAL / URINARY TRACT ULTRASOUND COMPLETE COMPARISON:  None. FINDINGS: Right Kidney: Renal measurements: 10.2 x 5.6 x 5.5 cm = volume: 164.7 mL. Increased cortical echogenicity. Left Kidney: Renal measurements: 9.7 x 5.1 x 5.0 cm = volume: 128.9 mL. Increased cortical echogenicity. Bladder: The bladder wall is thick and hyperemic. Other: None. IMPRESSION: 1. Increased cortical echogenicity bilaterally is consistent with medical renal disease. No hydronephrosis. 2. The bladder wall is thickened and hyperemic. This is nonspecific but could be due to cystitis. Recommend clinical correlation and correlation with urinalysis. Electronically Signed   By: Dorise Bullion III M.D   On: 10/04/2019 20:30   Medications: . sodium chloride 100 mL/hr at 10/04/19 2252   . heparin  5,000 Units Subcutaneous Q8H  . nicotine  14 mg Transdermal Daily  . sodium chloride flush  3 mL Intravenous Q12H  . sodium zirconium cyclosilicate  10 g Oral TID    Assessment/ Plan: 1. AKI, severe, oliguric- due to acute rhabdomyolysis related to intoxication/ drug use.  Creat rising, UOP is small.  No uremic signs at this time. Developing edema, will d/c IVF for now.  Plan is supportive care.  May need dialysis in the next 24-48h.  2. Opioid abuse - has been to rehab several times per Dad, relapses soon  after discharged.  3. Acute rhabdomyolysis - Ck 48k this AM.  4. Hyperkalemia - renal/low K diet. Improved to 5 as of midnight, repeat now.   Jannifer Hick MD 10/05/2019, 11:18 AM  [facility] Pager: [phone]

## 2019-10-06 LAB — COMPREHENSIVE METABOLIC PANEL
ALT: 366 U/L — ABNORMAL HIGH (ref 0–44)
AST: 617 U/L — ABNORMAL HIGH (ref 15–41)
Albumin: 2.4 g/dL — ABNORMAL LOW (ref 3.5–5.0)
Alkaline Phosphatase: 82 U/L (ref 38–126)
Anion gap: 11 (ref 5–15)
BUN: 56 mg/dL — ABNORMAL HIGH (ref 6–20)
CO2: 21 mmol/L — ABNORMAL LOW (ref 22–32)
Calcium: 8.1 mg/dL — ABNORMAL LOW (ref 8.9–10.3)
Chloride: 103 mmol/L (ref 98–111)
Creatinine, Ser: 7.87 mg/dL — ABNORMAL HIGH (ref 0.61–1.24)
GFR calc Af Amer: 10 mL/min — ABNORMAL LOW (ref >60)
GFR calc non Af Amer: 9 mL/min — ABNORMAL LOW (ref >60)
Glucose, Bld: 84 mg/dL (ref 70–99)
Potassium: 4.3 mmol/L (ref 3.5–5.1)
Sodium: 135 mmol/L (ref 135–145)
Total Bilirubin: 1.1 mg/dL (ref 0.3–1.2)
Total Protein: 4.9 g/dL — ABNORMAL LOW (ref 6.5–8.1)

## 2019-10-06 LAB — PHOSPHORUS: Phosphorus: 6.7 mg/dL — ABNORMAL HIGH (ref 2.5–4.6)

## 2019-10-06 LAB — CBC
HCT: 38.4 % — ABNORMAL LOW (ref 39.0–52.0)
Hemoglobin: 12.2 g/dL — ABNORMAL LOW (ref 13.0–17.0)
MCH: 25.6 pg — ABNORMAL LOW (ref 26.0–34.0)
MCHC: 31.8 g/dL (ref 30.0–36.0)
MCV: 80.5 fL (ref 80.0–100.0)
Platelets: 125 10*3/uL — ABNORMAL LOW (ref 150–400)
RBC: 4.77 MIL/uL (ref 4.22–5.81)
RDW: 14.2 % (ref 11.5–15.5)
WBC: 9.6 10*3/uL (ref 4.0–10.5)
nRBC: 0 % (ref 0.0–0.2)

## 2019-10-06 LAB — CK: Total CK: 24180 U/L — ABNORMAL HIGH (ref 49–397)

## 2019-10-06 MED ORDER — FUROSEMIDE 10 MG/ML IJ SOLN
120.0000 mg | Freq: Four times a day (QID) | INTRAVENOUS | Status: AC
  Administered 2019-10-06 (×2): 120 mg via INTRAVENOUS
  Filled 2019-10-06 (×2): qty 12

## 2019-10-06 NOTE — Progress Notes (Signed)
[facility] Progress Note   Subjective:   C/o orthopnea and decreased sensation and motor ability R foot.  Says has foot drop when ambulates UI/Os 2.0 / 0.3; says making urine but decreased volume.   father [phone] who is a psychiatrist in [place]  Objective Vitals:   10/05/19 1352 10/05/19 1940 10/06/19 0356 10/06/19 0751  BP: (!) 144/95 (!) 144/81 (!) 136/98 (!) 138/95  Pulse: 87 (!) 52 88 (!) 57  Resp: 14 14 16 14   Temp: 97.9 F (36.6 C) 98.3 F (36.8 C) 98.7 F (37.1 C) 98.2 F (36.8 C)  TempSrc: Oral Oral    SpO2: 97% 97% 98% 100%   Physical Exam General: no distress  Heart: no rub, RRR Lungs: rales in bases, normal WOB at rest, on RA Abdomen: soft, nontender Extremities: 2+ DPP bilateral, feet warm with normal cap refill  Neuro: conversant, decreased sensation and strength R foot  Additional Objective Labs: Basic Metabolic Panel: Recent Labs  Lab 10/05/19 0006 10/05/19 1225 10/06/19 0556  NA 136 134* 135  K 5.0 4.8 4.3  CL 103 104 103  CO2 22 19* 21*  GLUCOSE 83 84 84  BUN 43* 48* 56*  CREATININE 5.25* 6.08* 7.87*  CALCIUM 8.0* 8.0* 8.1*  PHOS 5.8* 5.9* 6.7*   Liver Function Tests: Recent Labs  Lab 10/03/19 1028 10/03/19 1028 10/04/19 0403 10/04/19 0403 10/05/19 0006 10/05/19 1225 10/06/19 0556  AST 1,753*  --  1,100*  --   --   --  617*  ALT 827*  --  626*  --   --   --  366*  ALKPHOS 106  --  80  --   --   --  82  BILITOT 1.6*  --  0.8  --   --   --  1.1  PROT 8.0  --  5.4*  --   --   --  4.9*  ALBUMIN 3.8   < > 2.5*   < > 2.3* 2.5* 2.4*   < > = values in this interval not displayed.   No results for input(s): LIPASE, AMYLASE in the last 168 hours. CBC: Recent Labs  Lab 10/03/19 0732 10/04/19 0403 10/06/19 0556  WBC 25.7* 16.9* 9.6  NEUTROABS 21.7* 12.5*  --   HGB 18.0* 16.5 12.2*  HCT 58.4* 52.1* 38.4*  MCV 84.8 80.7 80.5  PLT 255 214 125*   Blood Culture    Component Value Date/Time   SDES  10/03/2019 0732    BLOOD LEFT ANTECUBITAL Performed at [facility], [place] [address], [place], [place] [address]    SPECREQUEST  10/03/2019 0732    BOTTLES DRAWN AEROBIC AND ANAEROBIC Blood Culture adequate volume Performed at [place] [address] [place], [place] [address]    CULT  10/03/2019 0732    NO GROWTH 2 DAYS Performed at [place] [address], [place], [place] [address]    REPTSTATUS PENDING 10/03/2019 0732    Cardiac Enzymes: Recent Labs  Lab 10/03/19 0855 10/03/19 1028 10/04/19 0403 10/04/19 1516 10/05/19 0006  CKTOTAL SPECIMEN HEMOLYZED. HEMOLYSIS MAY AFFECT INTEGRITY OF RESULTS. 15,275* >50,000* 11,098* 48,454*   CBG: Recent Labs  Lab 10/03/19 1319  GLUCAP 181*   Iron Studies: No results for input(s): IRON, TIBC, TRANSFERRIN, FERRITIN in the last 72 hours. @lablastinr3 @ Studies/Results: US RENAL  Result Date: 10/04/2019 CLINICAL DATA:  Annual Rea EXAM: RENAL / URINARY TRACT ULTRASOUND COMPLETE COMPARISON:  None. FINDINGS: Right Kidney: Renal  measurements: 10.2 x 5.6 x 5.5 cm = volume: 164.7 mL. Increased cortical echogenicity. Left Kidney: Renal measurements: 9.7 x 5.1 x 5.0 cm = volume: 128.9 mL. Increased cortical echogenicity. Bladder: The bladder wall is thick and hyperemic. Other: None. IMPRESSION: 1. Increased cortical echogenicity bilaterally is consistent with medical renal disease. No hydronephrosis. 2. The bladder wall is thickened and hyperemic. This is nonspecific but could be due to cystitis. Recommend clinical correlation and correlation with urinalysis. Electronically Signed   By: Gerome Sam III M.D   On: 10/04/2019 20:30   Medications:  . heparin  5,000 Units Subcutaneous Q8H  . nicotine  14 mg Transdermal Daily  . sodium chloride flush  3 mL Intravenous Q12H    Assessment/ Plan: 1. AKI, severe, oliguric- due to acute rhabdomyolysis related to intoxication/ drug use.  Creat rising, borderline  oliguric.  No uremic signs at this time.  Volume overloaded + oliguric so off IVF for now and with rales/orthopnea will trial high dose lasix today.  If remains volume overloaded and oliguric, may need dialysis in the next 24-48h.   2. Opioid abuse - has been to rehab several times per Dad, relapses soon after discharged.  3. Acute rhabdomyolysis - traumatic. 4. Hyperkalemia - renal/low K diet. now normal. Cont to follow in light of severe AKI and rhabdo.  5. R foot deficits - no e/o compartment syndrome with normal pulse and perfused foot; defer evaluation to primary.   Estill Bakes MD 10/06/2019, 8:00 AM  [facility] Pager: [phone]

## 2019-10-06 NOTE — Progress Notes (Signed)
[facility] PROGRESS NOTE    Progress Note  Alex Potter  DJS:[id number] DOB: 1995-08-12 DOA: 10/03/2019 PCP: Patient, No Pcp Per     Brief Narrative:   Alex Potter is an 24 y.o. male past medical history of hyperlipidemia homeless who is followed [facility] comes into the ED for feeling bad, UDS was positive for cannabis with a white count of 25 platelet count of 255 and a CK of 15,000. She relates he is noted some cocaine and had multiple beers and that he passed out he does not know how much time he was out for maybe 6 to 12 hours.  Assessment/Plan:   Nontraumatic  rhabdomyolysis -MRI of the lumbar spine was showed that showed necrosis of muscle. CK is pending this morning, IV fluids have been KVO as he appears fluid overloaded on physical exam, he is anuric only about 400 cc today.  ATN likely due to rhabdomyolysis With a baseline creatinine of less than 1, despite fluid resuscitation his creatinine this morning is 7.8. Awaiting nephrology further recommendations. Continue IV fluids at a lower rate, he is mildly fluid overloaded by physical exam.  Hyperkalemia: Potassium is better today of 4.3 continue to monitor.  Polysubstance abuse: He endorses snorting heroin. Counseling.  Elevated LFTs: Likely due to rhabdomyolysis are slowly coming down.  Leukocytosis Likely reactive due to rhabdomyolysis now resolved.  Nausea vomiting: Controlled, continue Zofran.  DVT prophylaxis: heparin Family Communication:none Status is: Inpatient  Remains inpatient appropriate because:Hemodynamically unstable   Dispo: The patient is from: Home              Anticipated d/c is to: Home              Anticipated d/c date is: > 3 days              Patient currently is not medically stable to d/c.    ode Status:     Code Status Orders  (From admission, onward)         Start     Ordered   10/03/19 1624  Full code  Continuous     10/03/19 1623        Code  Status History    Date Active Date Inactive Code Status Order ID Comments User Context   07/30/2019 0437 08/01/2019 1731 Full Code [id number]  Carollee Herter, DO ED   11/03/2017 1937 11/07/2017 1738 Full Code [id number]  Tobey Grim, NP Inpatient   11/03/2017 1609 11/03/2017 1937 Full Code [id number]  Noralee Stain, DO Inpatient   07/03/2017 2327 07/09/2017 1842 Full Code [id number]  Lorretta Harp, MD ED   Advance Care Planning Activity        IV Access:    Peripheral IV   Procedures and diagnostic studies:   US RENAL  Result Date: 10/04/2019 CLINICAL DATA:  Annual Rea EXAM: RENAL / URINARY TRACT ULTRASOUND COMPLETE COMPARISON:  None. FINDINGS: Right Kidney: Renal measurements: 10.2 x 5.6 x 5.5 cm = volume: 164.7 mL. Increased cortical echogenicity. Left Kidney: Renal measurements: 9.7 x 5.1 x 5.0 cm = volume: 128.9 mL. Increased cortical echogenicity. Bladder: The bladder wall is thick and hyperemic. Other: None. IMPRESSION: 1. Increased cortical echogenicity bilaterally is consistent with medical renal disease. No hydronephrosis. 2. The bladder wall is thickened and hyperemic. This is nonspecific but could be due to cystitis. Recommend clinical correlation and correlation with urinalysis. Electronically Signed   By: Gerome Sam III M.D   On: 10/04/2019 20:30  Medical Consultants:    None.  Anti-Infectives:   None  Subjective:    Alex Potter he relates his pain is improved but now having numbness  Objective:    Vitals:   10/05/19 1352 10/05/19 1940 10/06/19 0356 10/06/19 0751  BP: (!) 144/95 (!) 144/81 (!) 136/98 (!) 138/95  Pulse: 87 (!) 52 88 (!) 57  Resp: 14 14 16 14   Temp: 97.9 F (36.6 C) 98.3 F (36.8 C) 98.7 F (37.1 C) 98.2 F (36.8 C)  TempSrc: Oral Oral    SpO2: 97% 97% 98% 100%   SpO2: 100 % O2 Flow Rate (L/min): 2 L/min   Intake/Output Summary (Last 24 hours) at 10/06/2019 10/08/2019 Last data filed at 10/06/2019 0500 Gross per 24 hour  Intake 1843 ml   Output 480 ml  Net 1363 ml   There were no vitals filed for this visit.  Exam: General exam: In no acute distress. Respiratory system: Good air movement and clear to auscultation. Cardiovascular system: S1 & S2 heard, RRR. No JVD. Gastrointestinal system: Abdomen is nondistended, soft and nontender.  Extremities: No pedal edema. Skin: No rashes, lesions or ulcers  Data Reviewed:    Labs: Basic Metabolic Panel: Recent Labs  Lab 10/04/19 0403 10/04/19 0403 10/04/19 1516 10/04/19 1516 10/05/19 0006 10/05/19 0006 10/05/19 1225 10/06/19 0556  NA 135  --  136  --  136  --  134* 135  K 5.4*   < > 5.8*   < > 5.0   < > 4.8 4.3  CL 101  --  105  --  103  --  104 103  CO2 23  --  23  --  22  --  19* 21*  GLUCOSE 113*  --  104*  --  83  --  84 84  BUN 37*  --  42*  --  43*  --  48* 56*  CREATININE 3.74*  --  4.48*  --  5.25*  --  6.08* 7.87*  CALCIUM 8.1*  --  8.0*  --  8.0*  --  8.0* 8.1*  MG 1.9  --   --   --   --   --   --   --   PHOS  --   --   --   --  5.8*  --  5.9* 6.7*   < > = values in this interval not displayed.   GFR Estimated Creatinine Clearance: 14.2 mL/min (A) (by C-G formula based on SCr of 7.87 mg/dL (H)). Liver Function Tests: Recent Labs  Lab 10/03/19 0855 10/03/19 0855 10/03/19 1028 10/04/19 0403 10/05/19 0006 10/05/19 1225 10/06/19 0556  AST 1,659*  --  1,753* 1,100*  --   --  617*  ALT 758*  --  827* 626*  --   --  366*  ALKPHOS 100  --  106 80  --   --  82  BILITOT 1.6*  --  1.6* 0.8  --   --  1.1  PROT 7.4  --  8.0 5.4*  --   --  4.9*  ALBUMIN 3.7   < > 3.8 2.5* 2.3* 2.5* 2.4*   < > = values in this interval not displayed.   No results for input(s): LIPASE, AMYLASE in the last 168 hours. No results for input(s): AMMONIA in the last 168 hours. Coagulation profile No results for input(s): INR, PROTIME in the last 168 hours. COVID-19 Labs  No results for input(s): DDIMER, FERRITIN, LDH, CRP in the  last 72 hours.  Lab Results  Component  Value Date   SARSCOV2NAA NEGATIVE 10/03/2019   SARSCOV2NAA NEGATIVE 07/30/2019    CBC: Recent Labs  Lab 10/03/19 0732 10/04/19 0403 10/06/19 0556  WBC 25.7* 16.9* 9.6  NEUTROABS 21.7* 12.5*  --   HGB 18.0* 16.5 12.2*  HCT 58.4* 52.1* 38.4*  MCV 84.8 80.7 80.5  PLT 255 214 125*   Cardiac Enzymes: Recent Labs  Lab 10/03/19 0855 10/03/19 1028 10/04/19 0403 10/04/19 1516 10/05/19 0006  CKTOTAL SPECIMEN HEMOLYZED. HEMOLYSIS MAY AFFECT INTEGRITY OF RESULTS. 15,275* >50,000* 11,098* 48,454*   BNP (last 3 results) No results for input(s): PROBNP in the last 8760 hours. CBG: Recent Labs  Lab 10/03/19 1319  GLUCAP 181*   D-Dimer: No results for input(s): DDIMER in the last 72 hours. Hgb A1c: No results for input(s): HGBA1C in the last 72 hours. Lipid Profile: No results for input(s): CHOL, HDL, LDLCALC, TRIG, CHOLHDL, LDLDIRECT in the last 72 hours. Thyroid function studies: No results for input(s): TSH, T4TOTAL, T3FREE, THYROIDAB in the last 72 hours.  Invalid input(s): FREET3 Anemia work up: No results for input(s): VITAMINB12, FOLATE, FERRITIN, TIBC, IRON, RETICCTPCT in the last 72 hours. Sepsis Labs: Recent Labs  Lab 10/03/19 0732 10/04/19 0403 10/06/19 0556  WBC 25.7* 16.9* 9.6   Microbiology Recent Results (from the past 240 hour(s))  Culture, blood (routine x 2)     Status: None (Preliminary result)   Collection Time: 10/03/19  7:32 AM   Specimen: BLOOD  Result Value Ref Range Status   Specimen Description   Final    BLOOD LEFT ANTECUBITAL Performed at [facility], [address] [place], [place] [address]    Special Requests   Final    BOTTLES DRAWN AEROBIC AND ANAEROBIC Blood Culture adequate volume Performed at [facility], [address], [place], [place] [address]    Culture   Final    NO GROWTH 2 DAYS Performed at [facility], [address] [place], [place] [address]    Report Status PENDING   Incomplete  SARS Coronavirus 2 by RT PCR (hospital order, performed in [facility] hospital lab) Nasopharyngeal Nasopharyngeal Swab     Status: None   Collection Time: 10/03/19 12:53 PM   Specimen: Nasopharyngeal Swab  Result Value Ref Range Status   SARS Coronavirus 2 NEGATIVE NEGATIVE Final    Comment: (NOTE) SARS-CoV-2 target nucleic acids are NOT DETECTED. The SARS-CoV-2 RNA is generally detectable in upper and lower respiratory specimens during the acute phase of infection. The lowest concentration of SARS-CoV-2 viral copies this assay can detect is 250 copies / mL. A negative result does not preclude SARS-CoV-2 infection and should not be used as the sole basis for treatment or other patient management decisions.  A negative result may occur with improper specimen collection / handling, submission of specimen other than nasopharyngeal swab, presence of viral mutation(s) within the areas targeted by this assay, and inadequate number of viral copies (<250 copies / mL). A negative result must be combined with clinical observations, patient history, and epidemiological information. Fact Sheet for Patients:   BoilerBrush.com.cy Fact Sheet for Healthcare Providers: https://pope.com/ This test is not yet approved or cleared  by the [place] FDA and has been authorized for detection and/or diagnosis of SARS-CoV-2 by FDA under an Emergency Use Authorization (EUA).  This EUA will remain in effect (meaning this test can be used) for the duration of the COVID-19 declaration under Section  564(b)(1) of the Act, 21 U.S.C. section 360bbb-3(b)(1), unless the authorization is terminated or revoked sooner. Performed at [facility], [address] [place], [place] [address]      Medications:   . heparin  5,000 Units Subcutaneous Q8H  . nicotine  14 mg Transdermal Daily  . sodium chloride flush  3 mL Intravenous Q12H    Continuous Infusions:     LOS: 3 days   Charlynne Cousins  [facility]  10/06/2019, 9:27 AM

## 2019-10-07 ENCOUNTER — Inpatient Hospital Stay (HOSPITAL_COMMUNITY): Payer: BLUE CROSS/BLUE SHIELD

## 2019-10-07 DIAGNOSIS — N170 Acute kidney failure with tubular necrosis: Secondary | ICD-10-CM

## 2019-10-07 DIAGNOSIS — F1910 Other psychoactive substance abuse, uncomplicated: Secondary | ICD-10-CM

## 2019-10-07 DIAGNOSIS — Z720 Tobacco use: Secondary | ICD-10-CM

## 2019-10-07 LAB — CBC
HCT: 38.7 % — ABNORMAL LOW (ref 39.0–52.0)
Hemoglobin: 12.7 g/dL — ABNORMAL LOW (ref 13.0–17.0)
MCH: 25.7 pg — ABNORMAL LOW (ref 26.0–34.0)
MCHC: 32.8 g/dL (ref 30.0–36.0)
MCV: 78.3 fL — ABNORMAL LOW (ref 80.0–100.0)
Platelets: 149 10*3/uL — ABNORMAL LOW (ref 150–400)
RBC: 4.94 MIL/uL (ref 4.22–5.81)
RDW: 13.7 % (ref 11.5–15.5)
WBC: 9.6 10*3/uL (ref 4.0–10.5)
nRBC: 0 % (ref 0.0–0.2)

## 2019-10-07 LAB — COMPREHENSIVE METABOLIC PANEL
ALT: 317 U/L — ABNORMAL HIGH (ref 0–44)
AST: 457 U/L — ABNORMAL HIGH (ref 15–41)
Albumin: 2.3 g/dL — ABNORMAL LOW (ref 3.5–5.0)
Alkaline Phosphatase: 87 U/L (ref 38–126)
Anion gap: 12 (ref 5–15)
BUN: 67 mg/dL — ABNORMAL HIGH (ref 6–20)
CO2: 20 mmol/L — ABNORMAL LOW (ref 22–32)
Calcium: 8.1 mg/dL — ABNORMAL LOW (ref 8.9–10.3)
Chloride: 100 mmol/L (ref 98–111)
Creatinine, Ser: 9.63 mg/dL — ABNORMAL HIGH (ref 0.61–1.24)
GFR calc Af Amer: 8 mL/min — ABNORMAL LOW (ref >60)
GFR calc non Af Amer: 7 mL/min — ABNORMAL LOW (ref >60)
Glucose, Bld: 85 mg/dL (ref 70–99)
Potassium: 4.1 mmol/L (ref 3.5–5.1)
Sodium: 132 mmol/L — ABNORMAL LOW (ref 135–145)
Total Bilirubin: 1.4 mg/dL — ABNORMAL HIGH (ref 0.3–1.2)
Total Protein: 4.5 g/dL — ABNORMAL LOW (ref 6.5–8.1)

## 2019-10-07 LAB — CK: Total CK: 20945 U/L — ABNORMAL HIGH (ref 49–397)

## 2019-10-07 LAB — PHOSPHORUS: Phosphorus: 7 mg/dL — ABNORMAL HIGH (ref 2.5–4.6)

## 2019-10-07 MED ORDER — IPRATROPIUM-ALBUTEROL 0.5-2.5 (3) MG/3ML IN SOLN
3.0000 mL | Freq: Four times a day (QID) | RESPIRATORY_TRACT | Status: DC | PRN
  Administered 2019-10-07: 3 mL via RESPIRATORY_TRACT
  Filled 2019-10-07: qty 3

## 2019-10-07 MED ORDER — FUROSEMIDE 10 MG/ML IJ SOLN
120.0000 mg | Freq: Once | INTRAVENOUS | Status: AC
  Administered 2019-10-07: 120 mg via INTRAVENOUS
  Filled 2019-10-07: qty 10

## 2019-10-07 NOTE — Evaluation (Signed)
Physical Therapy Evaluation Patient Details Name: Alex Potter MRN: [id number] DOB: [birth year] Today's Date: 10/07/2019   History of Present Illness  Alex Potter is an 24 y.o. male with past medical history notable for hyperlipidemia, homeless who was admitted due to rhabdomyolysis after passed out with use of cannabis and ETOH with prolonged down time.  Clinical Impression  Patient presents with R foot drop and numbness.  Currently mobilizing with S with RW.  Independent living on streets previously.  Feel he will benefit from skilled PT in the acute setting prior to d/c for gait training and use of DME.  Depending on progress may consider outpatient PT follow up.     Follow Up Recommendations Outpatient PT    Equipment Recommendations  Rolling walker with 5" wheels    Recommendations for Other Services       Precautions / Restrictions Precautions Precautions: Fall Precaution Comments: R foot drop and numbness      Mobility  Bed Mobility Overal bed mobility: Modified Independent                Transfers Overall transfer level: Needs assistance Equipment used: Rolling walker (2 wheeled) Transfers: Sit to/from Stand Sit to Stand: Supervision         General transfer comment: assist for enviornmental safety, upon entering the  room bed soaked and pt reports due to sweat from kpad, but noted kpad leaking water in bed so assisted with linen change and mopping up water  Ambulation/Gait Ambulation/Gait assistance: Supervision Gait Distance (Feet): 180 Feet Assistive device: Rolling walker (2 wheeled) Gait Pattern/deviations: Step-to pattern;Step-through pattern;Decreased stride length;Decreased dorsiflexion - right;Decreased step length - left     General Gait Details: cues for sequence, pt picking up rolling walker but no balance issues noted  Stairs Stairs: Yes       General stair comments: verbally educated on sequence for curb step with RW  Wheelchair  Mobility    Modified Rankin (Stroke Patients Only)       Balance Overall balance assessment: Needs assistance Sitting-balance support: Feet supported Sitting balance-Leahy Scale: Good     Standing balance support: During functional activity;No upper extremity supported;Single extremity supported Standing balance-Leahy Scale: Fair Standing balance comment: using walker to step with L due to pain in R, but standing at sink to wash up and in bathroom toileting without UE support                             Pertinent Vitals/Pain Pain Assessment: Faces Faces Pain Scale: Hurts even more Pain Location: R foot numbness Pain Descriptors / Indicators: Tingling Pain Intervention(s): Monitored during session;Repositioned    Home Living Family/patient expects to be discharged to:: Shelter/Homeless                      Prior Function Level of Independence: Independent               Hand Dominance        Extremity/Trunk Assessment   Upper Extremity Assessment Upper Extremity Assessment: LUE deficits/detail LUE Deficits / Details: edema throughout forearm and boggy elbow noted, scrape along lateral aspect of forearm so covered per pt request with gaze, functioning WNL    Lower Extremity Assessment Lower Extremity Assessment: RLE deficits/detail RLE Deficits / Details: 1/5 ankle DF/PF able to perform in[place]on, notes numbness/tingling throughout foot and thigh RLE Sensation: decreased light touch       Communication  Communication: No difficulties  Cognition Arousal/Alertness: Awake/alert Behavior During Therapy: WFL for tasks assessed/performed Overall Cognitive Status: Within Functional Limits for tasks assessed                                        General Comments General comments (skin integrity, edema, etc.): pt's girlfriend in the room and assisting some    Exercises     Assessment/Plan    PT Assessment Patient needs  continued PT services  PT Problem List Decreased strength;Decreased mobility;Decreased balance;Decreased knowledge of use of DME;Decreased safety awareness       PT Treatment Interventions DME instruction;Stair training;Therapeutic activities;Balance training;Therapeutic exercise;Functional mobility training;Gait training;Patient/family education    PT Goals (Current goals can be found in the Care Plan section)  Acute Rehab PT Goals Patient Stated Goal: return to independent PT Goal Formulation: With patient Time For Goal Achievement: 10/21/19 Potential to Achieve Goals: Good    Frequency Min 3X/week   Barriers to discharge        Co-evaluation               AM-PAC PT "6 Clicks" Mobility  Outcome Measure Help needed turning from your back to your side while in a flat bed without using bedrails?: None Help needed moving from lying on your back to sitting on the side of a flat bed without using bedrails?: None Help needed moving to and from a bed to a chair (including a wheelchair)?: None Help needed standing up from a chair using your arms (e.g., wheelchair or bedside chair)?: None Help needed to walk in hospital room?: A Little Help needed climbing 3-5 steps with a railing? : A Little 6 Click Score: 22    End of Session   Activity Tolerance: Patient tolerated treatment well Patient left: in bed;with call bell/phone within reach;with family/visitor present   PT Visit Diagnosis: Other abnormalities of gait and mobility (R26.89);Muscle weakness (generalized) (M62.81)    Time: 1415-1450 PT Time Calculation (min) (ACUTE ONLY): 35 min   Charges:   PT Evaluation $PT Eval Moderate Complexity: 1 Mod PT Treatments $Gait Training: 8-22 mins        Sheran Lawless, [place] [facility] [phone] 10/07/2019   Elray Mcgregor 10/07/2019, 3:56 PM

## 2019-10-07 NOTE — Progress Notes (Signed)
[facility] Progress Note   Subjective:   UOP 1L after lasix IV yesterday. C/o dyspnea this am.  No further emesis, light nausea.  No hiccups, no confusion.    father [phone] who is a psychiatrist in [place]  Objective Vitals:   10/06/19 1444 10/06/19 2010 10/07/19 0548 10/07/19 0813  BP: (!) 144/93 (!) 145/86 (!) 148/80 (!) 141/94  Pulse: 63 69 73 69  Resp: 15 16 18 16   Temp: 98.2 F (36.8 C) 98.6 F (37 C) 98.3 F (36.8 C) 97.6 F (36.4 C)  TempSrc: Oral Oral Oral Oral  SpO2: 93% 97% 98% 99%   Physical Exam General: no distress  Heart: no rub, RRR Lungs: clear to bases, normal WOB at rest, on RA Abdomen: soft, nontender Extremities: 2+ DPP bilateral, feet warm with normal cap refill  Neuro: conversant, decreased sensation and strength R foot  Additional Objective Labs: Basic Metabolic Panel: Recent Labs  Lab 10/05/19 1225 10/06/19 0556 10/07/19 0417  NA 134* 135 132*  K 4.8 4.3 4.1  CL 104 103 100  CO2 19* 21* 20*  GLUCOSE 84 84 85  BUN 48* 56* 67*  CREATININE 6.08* 7.87* 9.63*  CALCIUM 8.0* 8.1* 8.1*  PHOS 5.9* 6.7* 7.0*   Liver Function Tests: Recent Labs  Lab 10/04/19 0403 10/05/19 0006 10/05/19 1225 10/06/19 0556 10/07/19 0417  AST 1,100*  --   --  617* 457*  ALT 626*  --   --  366* 317*  ALKPHOS 80  --   --  82 87  BILITOT 0.8  --   --  1.1 1.4*  PROT 5.4*  --   --  4.9* 4.5*  ALBUMIN 2.5*   < > 2.5* 2.4* 2.3*   < > = values in this interval not displayed.   No results for input(s): LIPASE, AMYLASE in the last 168 hours. CBC: Recent Labs  Lab 10/03/19 0732 10/03/19 0732 10/04/19 0403 10/06/19 0556 10/07/19 0417  WBC 25.7*   < > 16.9* 9.6 9.6  NEUTROABS 21.7*  --  12.5*  --   --   HGB 18.0*   < > 16.5 12.2* 12.7*  HCT 58.4*   < > 52.1* 38.4* 38.7*  MCV 84.8  --  80.7 80.5 78.3*  PLT 255   < > 214 125* 149*   < > = values in this interval not displayed.   Blood Culture    Component Value Date/Time   SDES   10/03/2019 0732    BLOOD LEFT ANTECUBITAL Performed at [facility] [address], [place], [place] [address]    SPECREQUEST  10/03/2019 0732    BOTTLES DRAWN AEROBIC AND ANAEROBIC Blood Culture adequate volume Performed at [facility] [address] [place], [place] [address]    CULT  10/03/2019 0732    NO GROWTH 3 DAYS Performed at [facility] [address] [place], [place] [address]    REPTSTATUS PENDING 10/03/2019 0732    Cardiac Enzymes: Recent Labs  Lab 10/04/19 0403 10/04/19 1516 10/05/19 0006 10/06/19 0556 10/07/19 0417  CKTOTAL >50,000* 11,098* 48,454* 24,180* 20,945*   CBG: Recent Labs  Lab 10/03/19 1319  GLUCAP 181*   Iron Studies: No results for input(s): IRON, TIBC, TRANSFERRIN, FERRITIN in the last 72 hours. @lablastinr3 @ Studies/Results: No results found. Medications:  . heparin  5,000 Units Subcutaneous Q8H  . nicotine  14 mg Transdermal Daily  . sodium chloride flush  3 mL Intravenous Q12H  Assessment/ Plan: 1. AKI, severe, oliguric- due to acute rhabdomyolysis related to intoxication/ drug use.    No uremic signs at this time.  Responded to lasix yesterday with ^ UOP but labs continue to worsen.  Redose IV lasix today.  Cont supportive care and monitor for indications for dialysis.  Still at risk for requiring with rising creatinnie.   2. Opioid abuse - has been to rehab several times per Dad, relapses soon after discharged.  3. Acute rhabdomyolysis - traumatic. 4. Hyperkalemia - renal/low K diet. now normal. Cont to follow in light of severe AKI and rhabdo.  5. R foot deficits - no e/o compartment syndrome with normal pulse and perfused foot; defer evaluation to primary.  PT ordered. 6. Dyspnea - lungs clear, afebrile.  CXR eval for edema.    Estill Bakes MD 10/07/2019, 8:21 AM  [facility] Pager: [phone]

## 2019-10-07 NOTE — Plan of Care (Signed)
  Problem: Education: Goal: Knowledge of General Education information will improve Description: Including pain rating scale, medication(s)/side effects and non-pharmacologic comfort measures Outcome: Progressing   Problem: Coping: Goal: Level of anxiety will decrease Outcome: Progressing   Problem: Pain Managment: Goal: General experience of comfort will improve Outcome: Progressing   

## 2019-10-07 NOTE — Progress Notes (Signed)
PROGRESS NOTE    Alex Potter  TTS:[id number] DOB: 1996-04-23 DOA: 10/03/2019 PCP: Patient, No Pcp Per    Brief Narrative:  Alex Potter is an 24 y.o. male with past medical history notable for hyperlipidemia, homeless who is followed [facility] comes into the ED for feeling bad. UDS was positive for cannabis with a white count of 25 platelet count of 255 and a CK of 15,000.  He admits to cocaine use with multiple beers and that he passed out. He does not know how much time he was out for maybe 6 to 12 hours   Assessment & Plan:   Principal Problem:   Rhabdomyolysis Active Problems:   Tobacco abuse   Polysubstance abuse (HCC)   ATN (acute tubular necrosis) (HCC)   Hyperkalemia   Liver dysfunction   Leukocytosis   Rhabdomyolysis, nontraumatic CK level greater than 50,000 on admission with MRI L-spine notable for muscular inflammation with likely necrosis right quadratus lumborum, Jason's back muscles insistent with pressure necrosis. Etiology likely secondary to acute intoxication with narcotics/beer and on known downtime. --CK >50K, 48K, 24K, 20K --Aggressive IV fluid hydration discontinued secondary to renal failure/volume overload --Continue supportive care --Monitor CK level daily  Renal failure secondary to ATN from rhabdomyolysis Patient presenting with creatinine of 2.42, likely secondary to rhabdomyolysis on commitment illicit drug use. Renal ultrasound with increased cortical echogenicity bilaterally consistent with medical renal disease and no hydronephrosis. --Nephrology following, appreciate assistance --Cr 2.42>>7.87>9.63 --UOP 1L past 24h --Lasix 120mg  IV BID yesterday; plan repeating today --Possible need for HD if creatinine continues to rise --Monitor renal function daily  Hyperkalemia Potassium 5.8 on admission, likely secondary to renal failure. --Potassium 4.1 today --Continue renal/low potassium diet --Continue to monitor electrolytes  closely  Polysubstance abuse Patient with history of EtOH, cocaine, heroin, THC abuse. UDS on admission positive for THC and opiates. Admitted snorting heroin. Discussed with him the need for complete cessation. Has been to rehab several times in the past per his father but typically relapses soon after discharge. --Social work consultation  Tobacco use disorder: --Nicotine patch   DVT prophylaxis: Heparin Code Status: Full code Family Communication: None present at bedside  Disposition Plan:  Status is: Inpatient  Remains inpatient appropriate because:Persistent severe electrolyte disturbances, Ongoing diagnostic testing needed not appropriate for outpatient work up, Unsafe d/c plan and IV treatments appropriate due to intensity of illness or inability to take PO   Dispo: The patient is from: Home              Anticipated d/c is to: Home              Anticipated d/c date is: 3 days              Patient currently is not medically stable to d/c.    Consultants:   Nephrology  Procedures:   None  Antimicrobials:   None   Subjective: Patient seen and examined bedside, resting comfortably, talking on telephone/video chatting. Continues with some discomfort to his low back/gluteal region. Complains of some shortness of breath. No other complaints or concerns at this time. Denies headache, no chest pain, no palpitations, no abdominal pain. No acute concerns overnight per nursing staff.  Objective: Vitals:   10/06/19 2010 10/07/19 0548 10/07/19 0813 10/07/19 1407  BP: (!) 145/86 (!) 148/80 (!) 141/94 (!) 136/97  Pulse: 69 73 69 76  Resp: 16 18 16 15   Temp: 98.6 F (37 C) 98.3 F (36.8 C) 97.6 F (36.4  C) 98.5 F (36.9 C)  TempSrc: Oral Oral Oral Oral  SpO2: 97% 98% 99% 96%    Intake/Output Summary (Last 24 hours) at 10/07/2019 1438 Last data filed at 10/07/2019 0957 Gross per 24 hour  Intake 530 ml  Output 1450 ml  Net -920 ml   There were no vitals filed for  this visit.  Examination:  General exam: Appears calm and comfortable  Respiratory system: Clear to auscultation. Respiratory effort normal. Cardiovascular system: S1 & S2 heard, RRR. No JVD, murmurs, rubs, gallops or clicks. No pedal edema. Gastrointestinal system: Abdomen is nondistended, soft and nontender. No organomegaly or masses felt. Normal bowel sounds heard. Central nervous system: Alert and oriented. No focal neurological deficits. Extremities: Symmetric 5 x 5 power. Skin: No rashes, lesions or ulcers Psychiatry: Judgement and insight appear normal. Mood & affect appropriate.     Data Reviewed: I have personally reviewed following labs and imaging studies  CBC: Recent Labs  Lab 10/03/19 0732 10/04/19 0403 10/06/19 0556 10/07/19 0417  WBC 25.7* 16.9* 9.6 9.6  NEUTROABS 21.7* 12.5*  --   --   HGB 18.0* 16.5 12.2* 12.7*  HCT 58.4* 52.1* 38.4* 38.7*  MCV 84.8 80.7 80.5 78.3*  PLT 255 214 125* 149*   Basic Metabolic Panel: Recent Labs  Lab 10/04/19 0403 10/04/19 0403 10/04/19 1516 10/05/19 0006 10/05/19 1225 10/06/19 0556 10/07/19 0417  NA 135   < > 136 136 134* 135 132*  K 5.4*   < > 5.8* 5.0 4.8 4.3 4.1  CL 101   < > 105 103 104 103 100  CO2 23   < > 23 22 19* 21* 20*  GLUCOSE 113*   < > 104* 83 84 84 85  BUN 37*   < > 42* 43* 48* 56* 67*  CREATININE 3.74*   < > 4.48* 5.25* 6.08* 7.87* 9.63*  CALCIUM 8.1*   < > 8.0* 8.0* 8.0* 8.1* 8.1*  MG 1.9  --   --   --   --   --   --   PHOS  --   --   --  5.8* 5.9* 6.7* 7.0*   < > = values in this interval not displayed.   GFR: CrCl cannot be calculated (Unknown ideal weight.). Liver Function Tests: Recent Labs  Lab 10/03/19 0855 10/03/19 0855 10/03/19 1028 10/03/19 1028 10/04/19 0403 10/05/19 0006 10/05/19 1225 10/06/19 0556 10/07/19 0417  AST 1,659*  --  1,753*  --  1,100*  --   --  617* 457*  ALT 758*  --  827*  --  626*  --   --  366* 317*  ALKPHOS 100  --  106  --  80  --   --  82 87  BILITOT  1.6*  --  1.6*  --  0.8  --   --  1.1 1.4*  PROT 7.4  --  8.0  --  5.4*  --   --  4.9* 4.5*  ALBUMIN 3.7   < > 3.8   < > 2.5* 2.3* 2.5* 2.4* 2.3*   < > = values in this interval not displayed.   No results for input(s): LIPASE, AMYLASE in the last 168 hours. No results for input(s): AMMONIA in the last 168 hours. Coagulation Profile: No results for input(s): INR, PROTIME in the last 168 hours. Cardiac Enzymes: Recent Labs  Lab 10/04/19 0403 10/04/19 1516 10/05/19 0006 10/06/19 0556 10/07/19 0417  CKTOTAL >50,000* 11,098* 44,315* 24,180* 40,086*  BNP (last 3 results) No results for input(s): PROBNP in the last 8760 hours. HbA1C: No results for input(s): HGBA1C in the last 72 hours. CBG: Recent Labs  Lab 10/03/19 1319  GLUCAP 181*   Lipid Profile: No results for input(s): CHOL, HDL, LDLCALC, TRIG, CHOLHDL, LDLDIRECT in the last 72 hours. Thyroid Function Tests: No results for input(s): TSH, T4TOTAL, FREET4, T3FREE, THYROIDAB in the last 72 hours. Anemia Panel: No results for input(s): VITAMINB12, FOLATE, FERRITIN, TIBC, IRON, RETICCTPCT in the last 72 hours. Sepsis Labs: No results for input(s): PROCALCITON, LATICACIDVEN in the last 168 hours.  Recent Results (from the past 240 hour(s))  Culture, blood (routine x 2)     Status: None (Preliminary result)   Collection Time: 10/03/19  7:32 AM   Specimen: BLOOD  Result Value Ref Range Status   Specimen Description   Final    BLOOD LEFT ANTECUBITAL Performed at [facility] [address] [place], [place] [address]    Special Requests   Final    BOTTLES DRAWN AEROBIC AND ANAEROBIC Blood Culture adequate volume Performed at [facility] [address], [place], [place] [address]    Culture   Final    NO GROWTH 4 DAYS Performed at [facility], [place] [address] [place], [place] [address]    Report Status PENDING  Incomplete  SARS Coronavirus 2 by RT PCR (hospital order,  performed in [facility] hospital lab) Nasopharyngeal Nasopharyngeal Swab     Status: None   Collection Time: 10/03/19 12:53 PM   Specimen: Nasopharyngeal Swab  Result Value Ref Range Status   SARS Coronavirus 2 NEGATIVE NEGATIVE Final    Comment: (NOTE) SARS-CoV-2 target nucleic acids are NOT DETECTED. The SARS-CoV-2 RNA is generally detectable in upper and lower respiratory specimens during the acute phase of infection. The lowest concentration of SARS-CoV-2 viral copies this assay can detect is 250 copies / mL. A negative result does not preclude SARS-CoV-2 infection and should not be used as the sole basis for treatment or other patient management decisions.  A negative result may occur with improper specimen collection / handling, submission of specimen other than nasopharyngeal swab, presence of viral mutation(s) within the areas targeted by this assay, and inadequate number of viral copies (<250 copies / mL). A negative result must be combined with clinical observations, patient history, and epidemiological information. Fact Sheet for Patients:   StrictlyIdeas.no Fact Sheet for Healthcare Providers: BankingDealers.co.za This test is not yet approved or cleared  by the [place] FDA and has been authorized for detection and/or diagnosis of SARS-CoV-2 by FDA under an Emergency Use Authorization (EUA).  This EUA will remain in effect (meaning this test can be used) for the duration of the COVID-19 declaration under Section 564(b)(1) of the Act, 21 U.S.C. section 360bbb-3(b)(1), unless the authorization is terminated or revoked sooner. Performed at [facility], [place] [address], [place], [place] [address]          Radiology Studies: DG CHEST PORT 1 VIEW  Result Date: 10/07/2019 CLINICAL DATA:  Shortness of breath EXAM: PORTABLE CHEST 1 VIEW COMPARISON:  Oct 03, 2019 FINDINGS: Lungs are clear. Heart size  and pulmonary vascularity are normal. No adenopathy. No bone lesions. IMPRESSION: No abnormality noted. Electronically Signed   By: Lowella Grip III M.D.   On: 10/07/2019 09:49        Scheduled Meds: . heparin  5,000 Units Subcutaneous Q8H  . nicotine  14 mg  Transdermal Daily  . sodium chloride flush  3 mL Intravenous Q12H   Continuous Infusions:   LOS: 4 days    Time spent: 36 minutes spent on chart review, discussion with nursing staff, consultants, updating family and interview/physical exam; more than 50% of that time was spent in counseling and/or coordination of care.    Alvira Philips [place], DO [facility] Available via Epic secure chat 7am-7pm After these hours, please refer to coverage provider listed on [email or web address] 10/07/2019, 2:38 PM

## 2019-10-08 ENCOUNTER — Inpatient Hospital Stay (HOSPITAL_COMMUNITY): Payer: BLUE CROSS/BLUE SHIELD

## 2019-10-08 DIAGNOSIS — R609 Edema, unspecified: Secondary | ICD-10-CM

## 2019-10-08 LAB — COMPREHENSIVE METABOLIC PANEL
ALT: 268 U/L — ABNORMAL HIGH (ref 0–44)
AST: 338 U/L — ABNORMAL HIGH (ref 15–41)
Albumin: 2.3 g/dL — ABNORMAL LOW (ref 3.5–5.0)
Alkaline Phosphatase: 74 U/L (ref 38–126)
Anion gap: 14 (ref 5–15)
BUN: 77 mg/dL — ABNORMAL HIGH (ref 6–20)
CO2: 20 mmol/L — ABNORMAL LOW (ref 22–32)
Calcium: 8.3 mg/dL — ABNORMAL LOW (ref 8.9–10.3)
Chloride: 98 mmol/L (ref 98–111)
Creatinine, Ser: 11.47 mg/dL — ABNORMAL HIGH (ref 0.61–1.24)
GFR calc Af Amer: 6 mL/min — ABNORMAL LOW (ref >60)
GFR calc non Af Amer: 6 mL/min — ABNORMAL LOW (ref >60)
Glucose, Bld: 90 mg/dL (ref 70–99)
Potassium: 4.1 mmol/L (ref 3.5–5.1)
Sodium: 132 mmol/L — ABNORMAL LOW (ref 135–145)
Total Bilirubin: 1.4 mg/dL — ABNORMAL HIGH (ref 0.3–1.2)
Total Protein: 4.8 g/dL — ABNORMAL LOW (ref 6.5–8.1)

## 2019-10-08 LAB — CULTURE, BLOOD (ROUTINE X 2)
Culture: NO GROWTH
Special Requests: ADEQUATE

## 2019-10-08 LAB — CBC
HCT: 37.1 % — ABNORMAL LOW (ref 39.0–52.0)
Hemoglobin: 12.3 g/dL — ABNORMAL LOW (ref 13.0–17.0)
MCH: 25.8 pg — ABNORMAL LOW (ref 26.0–34.0)
MCHC: 33.2 g/dL (ref 30.0–36.0)
MCV: 77.8 fL — ABNORMAL LOW (ref 80.0–100.0)
Platelets: 138 10*3/uL — ABNORMAL LOW (ref 150–400)
RBC: 4.77 MIL/uL (ref 4.22–5.81)
RDW: 13.8 % (ref 11.5–15.5)
WBC: 9.1 10*3/uL (ref 4.0–10.5)
nRBC: 0 % (ref 0.0–0.2)

## 2019-10-08 LAB — CK: Total CK: 15533 U/L — ABNORMAL HIGH (ref 49–397)

## 2019-10-08 NOTE — Progress Notes (Signed)
PROGRESS NOTE    Alex Potter  IWP:[id number] DOB: 13-May-1996 DOA: 10/03/2019 PCP: Patient, No Pcp Per    Brief Narrative:  Alex Potter is an 24 y.o. male with past medical history notable for hyperlipidemia, homeless who is followed [facility] comes into the ED for feeling bad. UDS was positive for cannabis with a white count of 25 platelet count of 255 and a CK of 15,000.  He admits to cocaine use with multiple beers and that he passed out. He does not know how much time he was out for maybe 6 to 12 hours   Assessment & Plan:   Principal Problem:   Rhabdomyolysis Active Problems:   Tobacco abuse   Polysubstance abuse (HCC)   ATN (acute tubular necrosis) (HCC)   Hyperkalemia   Liver dysfunction   Leukocytosis   Rhabdomyolysis, nontraumatic CK level greater than 50,000 on admission with MRI L-spine notable for muscular inflammation with likely necrosis right quadratus lumborum, Jason's back muscles insistent with pressure necrosis. Etiology likely secondary to acute intoxication with narcotics/beer and on known downtime. --CK >50K, 48K, 24K, 20K, 15.5K --Aggressive IV fluid hydration discontinued secondary to renal failure/volume overload --Continue supportive care --Monitor CK level daily  Renal failure secondary to ATN from rhabdomyolysis Patient presenting with creatinine of 2.42, likely secondary to rhabdomyolysis on commitment illicit drug use. Renal ultrasound with increased cortical echogenicity bilaterally consistent with medical renal disease and no hydronephrosis. --Nephrology following, appreciate assistance --Cr 2.42>>7.87>9.63>11.47 --UOP 2.275L past 24h --Lasix 120mg  IV x 1 --Possible need for HD if creatinine continues to rise --Monitor renal function daily  Hyperkalemia Potassium 5.8 on admission, likely secondary to renal failure. --Potassium 4.1 today --Continue renal/low potassium diet --Continue to monitor electrolytes  closely  Polysubstance abuse Patient with history of EtOH, cocaine, heroin, THC abuse. UDS on admission positive for THC and opiates. Admitted snorting heroin. Discussed with him the need for complete cessation. Has been to rehab several times in the past per his father but typically relapses soon after discharge. --Social work consultation  Tobacco use disorder: --Nicotine patch  Right ankle/foot pain: --check xray    DVT prophylaxis: Heparin Code Status: Full code Family Communication: None present at bedside  Disposition Plan:  Status is: Inpatient  Remains inpatient appropriate because:Persistent severe electrolyte disturbances, Ongoing diagnostic testing needed not appropriate for outpatient work up, Unsafe d/c plan and IV treatments appropriate due to intensity of illness or inability to take PO   Dispo: The patient is from: Home              Anticipated d/c is to: Home              Anticipated d/c date is: 3 days              Patient currently is not medically stable to d/c.    Consultants:   Nephrology  Procedures:   None  Antimicrobials:   None   Subjective: Patient seen and examined bedside, resting comfortably. Continues with muscle aches to his low back/gluteal region. No other complaints or concerns at this time. Denies headache, no chest pain, no palpitations, no abdominal pain. No acute concerns overnight per nursing staff.  Objective: Vitals:   10/07/19 1407 10/07/19 1920 10/08/19 0405 10/08/19 0722  BP: (!) 136/97 (!) 140/99 (!) 144/90 130/87  Pulse: 76 73 72 72  Resp: 15 17 17 16   Temp: 98.5 F (36.9 C) 98.5 F (36.9 C) 97.7 F (36.5 C) (!) 97.5 F (36.4 C)  TempSrc: Oral Oral Oral Oral  SpO2: 96% 96% 96% 97%    Intake/Output Summary (Last 24 hours) at 10/08/2019 1430 Last data filed at 10/08/2019 1100 Gross per 24 hour  Intake 720 ml  Output 1475 ml  Net -755 ml   There were no vitals filed for this visit.  Examination:  General  exam: Appears calm and comfortable  Respiratory system: Clear to auscultation. Respiratory effort normal. Cardiovascular system: S1 & S2 heard, RRR. No JVD, murmurs, rubs, gallops or clicks. No pedal edema. Gastrointestinal system: Abdomen is nondistended, soft and nontender. No organomegaly or masses felt. Normal bowel sounds heard. Central nervous system: Alert and oriented. No focal neurological deficits. Extremities: Symmetric 5 x 5 power. Skin: No rashes, lesions or ulcers Psychiatry: Judgement and insight appear normal. Mood & affect appropriate.     Data Reviewed: I have personally reviewed following labs and imaging studies  CBC: Recent Labs  Lab 10/03/19 0732 10/04/19 0403 10/06/19 0556 10/07/19 0417 10/08/19 0419  WBC 25.7* 16.9* 9.6 9.6 9.1  NEUTROABS 21.7* 12.5*  --   --   --   HGB 18.0* 16.5 12.2* 12.7* 12.3*  HCT 58.4* 52.1* 38.4* 38.7* 37.1*  MCV 84.8 80.7 80.5 78.3* 77.8*  PLT 255 214 125* 149* 138*   Basic Metabolic Panel: Recent Labs  Lab 10/04/19 0403 10/04/19 1516 10/05/19 0006 10/05/19 1225 10/06/19 0556 10/07/19 0417 10/08/19 0419  NA 135   < > 136 134* 135 132* 132*  K 5.4*   < > 5.0 4.8 4.3 4.1 4.1  CL 101   < > 103 104 103 100 98  CO2 23   < > 22 19* 21* 20* 20*  GLUCOSE 113*   < > 83 84 84 85 90  BUN 37*   < > 43* 48* 56* 67* 77*  CREATININE 3.74*   < > 5.25* 6.08* 7.87* 9.63* 11.47*  CALCIUM 8.1*   < > 8.0* 8.0* 8.1* 8.1* 8.3*  MG 1.9  --   --   --   --   --   --   PHOS  --   --  5.8* 5.9* 6.7* 7.0*  --    < > = values in this interval not displayed.   GFR: CrCl cannot be calculated (Unknown ideal weight.). Liver Function Tests: Recent Labs  Lab 10/03/19 1028 10/03/19 1028 10/04/19 0403 10/04/19 0403 10/05/19 0006 10/05/19 1225 10/06/19 0556 10/07/19 0417 10/08/19 0419  AST 1,753*  --  1,100*  --   --   --  617* 457* 338*  ALT 827*  --  626*  --   --   --  366* 317* 268*  ALKPHOS 106  --  80  --   --   --  82 87 74  BILITOT  1.6*  --  0.8  --   --   --  1.1 1.4* 1.4*  PROT 8.0  --  5.4*  --   --   --  4.9* 4.5* 4.8*  ALBUMIN 3.8   < > 2.5*   < > 2.3* 2.5* 2.4* 2.3* 2.3*   < > = values in this interval not displayed.   No results for input(s): LIPASE, AMYLASE in the last 168 hours. No results for input(s): AMMONIA in the last 168 hours. Coagulation Profile: No results for input(s): INR, PROTIME in the last 168 hours. Cardiac Enzymes: Recent Labs  Lab 10/04/19 1516 10/05/19 0006 10/06/19 0556 10/07/19 0417 10/08/19 0419  CKTOTAL 11,098* 95,284* 24,180*  50,277* 15,533*   BNP (last 3 results) No results for input(s): PROBNP in the last 8760 hours. HbA1C: No results for input(s): HGBA1C in the last 72 hours. CBG: Recent Labs  Lab 10/03/19 1319  GLUCAP 181*   Lipid Profile: No results for input(s): CHOL, HDL, LDLCALC, TRIG, CHOLHDL, LDLDIRECT in the last 72 hours. Thyroid Function Tests: No results for input(s): TSH, T4TOTAL, FREET4, T3FREE, THYROIDAB in the last 72 hours. Anemia Panel: No results for input(s): VITAMINB12, FOLATE, FERRITIN, TIBC, IRON, RETICCTPCT in the last 72 hours. Sepsis Labs: No results for input(s): PROCALCITON, LATICACIDVEN in the last 168 hours.  Recent Results (from the past 240 hour(s))  Culture, blood (routine x 2)     Status: None   Collection Time: 10/03/19  7:32 AM   Specimen: BLOOD  Result Value Ref Range Status   Specimen Description   Final    BLOOD LEFT ANTECUBITAL Performed at [facility], [address] [place], [place] [address]    Special Requests   Final    BOTTLES DRAWN AEROBIC AND ANAEROBIC Blood Culture adequate volume Performed at [facility], [address] [place], [place] [address]    Culture   Final    NO GROWTH 5 DAYS Performed at [facility], [address], [place], [place] [address]    Report Status 10/08/2019 FINAL  Final  SARS Coronavirus 2 by RT PCR (hospital order, performed in [facility] hospital lab) Nasopharyngeal Nasopharyngeal Swab     Status: None   Collection Time: 10/03/19 12:53 PM   Specimen: Nasopharyngeal Swab  Result Value Ref Range Status   SARS Coronavirus 2 NEGATIVE NEGATIVE Final    Comment: (NOTE) SARS-CoV-2 target nucleic acids are NOT DETECTED. The SARS-CoV-2 RNA is generally detectable in upper and lower respiratory specimens during the acute phase of infection. The lowest concentration of SARS-CoV-2 viral copies this assay can detect is 250 copies / mL. A negative result does not preclude SARS-CoV-2 infection and should not be used as the sole basis for treatment or other patient management decisions.  A negative result may occur with improper specimen collection / handling, submission of specimen other than nasopharyngeal swab, presence of viral mutation(s) within the areas targeted by this assay, and inadequate number of viral copies (<250 copies / mL). A negative result must be combined with clinical observations, patient history, and epidemiological information. Fact Sheet for Patients:   BoilerBrush.com.cy Fact Sheet for Healthcare Providers: https://pope.com/ This test is not yet approved or cleared  by the [place] FDA and has been authorized for detection and/or diagnosis of SARS-CoV-2 by FDA under an Emergency Use Authorization (EUA).  This EUA will remain in effect (meaning this test can be used) for the duration of the COVID-19 declaration under Section 564(b)(1) of the Act, 21 U.S.C. section 360bbb-3(b)(1), unless the authorization is terminated or revoked sooner. Performed at [facility], [address], [place], [place] [address]          Radiology Studies: DG CHEST PORT 1 VIEW  Result Date: 10/07/2019 CLINICAL DATA:  Shortness of breath EXAM: PORTABLE CHEST 1 VIEW COMPARISON:  Oct 03, 2019 FINDINGS: Lungs are clear. Heart size and pulmonary  vascularity are normal. No adenopathy. No bone lesions. IMPRESSION: No abnormality noted. Electronically Signed   By: Bretta Bang III M.D.   On: 10/07/2019 09:49        Scheduled Meds: . heparin  5,000 Units Subcutaneous Q8H  . nicotine  14 mg Transdermal Daily  . sodium chloride flush  3 mL Intravenous Q12H   Continuous Infusions:   LOS: 5 days    Time spent: 36 minutes spent on chart review, discussion with nursing staff, consultants, updating family and interview/physical exam; more than 50% of that time was spent in counseling and/or coordination of care.    Alvira Philips [place], DO [facility] Available via Epic secure chat 7am-7pm After these hours, please refer to coverage provider listed on [email or web address] 10/08/2019, 2:30 PM

## 2019-10-08 NOTE — Progress Notes (Signed)
Left upper extremity venous duplex has been completed. Preliminary results can be found in CV Proc through chart review.   10/08/19 4:06 PM Olen Cordial RVT

## 2019-10-08 NOTE — Plan of Care (Signed)

## 2019-10-08 NOTE — Progress Notes (Addendum)
[facility] Progress Note   Subjective:   UOP 2.3 L after lasix IV yesterday. CXR yesterday clear. No new issues, no dysgeusia just doesn't like food here.  No pruritis or AKS.   father [phone] who is a psychiatrist in [facility] - updated today  Objective Vitals:   10/07/19 1407 10/07/19 1920 10/08/19 0405 10/08/19 0722  BP: (!) 136/97 (!) 140/99 (!) 144/90 130/87  Pulse: 76 73 72 72  Resp: 15 17 17 16   Temp: 98.5 F (36.9 C) 98.5 F (36.9 C) 97.7 F (36.5 C) (!) 97.5 F (36.4 C)  TempSrc: Oral Oral Oral Oral  SpO2: 96% 96% 96% 97%   Physical Exam General: comfortable Heart: no rub, RRR Lungs: clear to bases, normal WOB at rest, on RA Abdomen: soft, nontender Extremities: 2+ DPP bilateral, feet warm with normal cap refill, LUE with new 1+ pitting edema Neuro: conversant, decreased sensation and strength R foot  Additional Objective Labs: Basic Metabolic Panel: Recent Labs  Lab 10/05/19 1225 10/05/19 1225 10/06/19 0556 10/07/19 0417 10/08/19 0419  NA 134*   < > 135 132* 132*  K 4.8   < > 4.3 4.1 4.1  CL 104   < > 103 100 98  CO2 19*   < > 21* 20* 20*  GLUCOSE 84   < > 84 85 90  BUN 48*   < > 56* 67* 77*  CREATININE 6.08*   < > 7.87* 9.63* 11.47*  CALCIUM 8.0*   < > 8.1* 8.1* 8.3*  PHOS 5.9*  --  6.7* 7.0*  --    < > = values in this interval not displayed.   Liver Function Tests: Recent Labs  Lab 10/06/19 0556 10/07/19 0417 10/08/19 0419  AST 617* 457* 338*  ALT 366* 317* 268*  ALKPHOS 82 87 74  BILITOT 1.1 1.4* 1.4*  PROT 4.9* 4.5* 4.8*  ALBUMIN 2.4* 2.3* 2.3*   No results for input(s): LIPASE, AMYLASE in the last 168 hours. CBC: Recent Labs  Lab 10/03/19 0732 10/03/19 0732 10/04/19 0403 10/04/19 0403 10/06/19 0556 10/07/19 0417 10/08/19 0419  WBC 25.7*   < > 16.9*   < > 9.6 9.6 9.1  NEUTROABS 21.7*  --  12.5*  --   --   --   --   HGB 18.0*   < > 16.5   < > 12.2* 12.7* 12.3*  HCT 58.4*   < > 52.1*   < > 38.4* 38.7* 37.1*  MCV  84.8  --  80.7  --  80.5 78.3* 77.8*  PLT 255   < > 214   < > 125* 149* 138*   < > = values in this interval not displayed.   Blood Culture    Component Value Date/Time   SDES  10/03/2019 0732    BLOOD LEFT ANTECUBITAL Performed at [facility], [address], [place], [place]    SPECREQUEST  10/03/2019 0732    BOTTLES DRAWN AEROBIC AND ANAEROBIC Blood Culture adequate volume Performed at [facility], [address] [place], [place]    CULT  10/03/2019 0732    NO GROWTH 4 DAYS Performed at [facility], [address], [place], [place]    REPTSTATUS PENDING 10/03/2019 0732    Cardiac Enzymes: Recent Labs  Lab 10/04/19 1516 10/05/19 0006 10/06/19 0556 10/07/19 0417 10/08/19 0419  CKTOTAL 11,098* 48,454* 24,180* 20,945* 15,533*   CBG: Recent Labs  Lab 10/03/19 1319  GLUCAP 181*   Iron Studies: No results for input(s): IRON, TIBC, TRANSFERRIN, FERRITIN in the last 72 hours. @lablastinr3 @ Studies/Results: DG CHEST PORT 1 VIEW  Result Date: 10/07/2019 CLINICAL DATA:  Shortness of breath EXAM: PORTABLE CHEST 1 VIEW COMPARISON:  Oct 03, 2019 FINDINGS: Lungs are clear. Heart size and pulmonary vascularity are normal. No adenopathy. No bone lesions. IMPRESSION: No abnormality noted. Electronically Signed   By: Lowella Grip III M.D.   On: 10/07/2019 09:49   Medications:  . heparin  5,000 Units Subcutaneous Q8H  . nicotine  14 mg Transdermal Daily  . sodium chloride flush  3 mL Intravenous Q12H    Assessment/ Plan: 1. AKI, severe, oliguric- due to acute rhabdomyolysis related to intoxication/ drug use.    No uremic signs at this time.  Appears euvolemic, can use high dose lasix if needed.   Cont supportive care and monitor for indications for dialysis.  Still at risk for requiring with rising creatinine.   2. Opioid abuse - has been to rehab several times per Dad, relapses soon after  discharged.  Has been counselled  3. Acute rhabdomyolysis - traumatic. 4. Hyperkalemia - renal/low K diet. now normal. Cont to follow in light of severe AKI and rhabdo.  5. R foot deficits - no e/o compartment syndrome with normal pulse and perfused foot; defer evaluation of continued significant deficits to primary.  PT ordered - eval noted.   6. LUE edema  - new, check venous duplex r/o DVT, on SQ heparin prophylaxis.   Jannifer Hick MD 10/08/2019, 8:03 AM  [facility] Pager: [phone]

## 2019-10-09 LAB — COMPREHENSIVE METABOLIC PANEL
ALT: 211 U/L — ABNORMAL HIGH (ref 0–44)
AST: 194 U/L — ABNORMAL HIGH (ref 15–41)
Albumin: 2.4 g/dL — ABNORMAL LOW (ref 3.5–5.0)
Alkaline Phosphatase: 64 U/L (ref 38–126)
Anion gap: 9 (ref 5–15)
BUN: 89 mg/dL — ABNORMAL HIGH (ref 6–20)
CO2: 22 mmol/L (ref 22–32)
Calcium: 8.5 mg/dL — ABNORMAL LOW (ref 8.9–10.3)
Chloride: 100 mmol/L (ref 98–111)
Creatinine, Ser: 13.28 mg/dL — ABNORMAL HIGH (ref 0.61–1.24)
GFR calc Af Amer: 5 mL/min — ABNORMAL LOW (ref >60)
GFR calc non Af Amer: 5 mL/min — ABNORMAL LOW (ref >60)
Glucose, Bld: 93 mg/dL (ref 70–99)
Potassium: 4.4 mmol/L (ref 3.5–5.1)
Sodium: 131 mmol/L — ABNORMAL LOW (ref 135–145)
Total Bilirubin: 2.3 mg/dL — ABNORMAL HIGH (ref 0.3–1.2)
Total Protein: 4.9 g/dL — ABNORMAL LOW (ref 6.5–8.1)

## 2019-10-09 LAB — CBC
HCT: 38.3 % — ABNORMAL LOW (ref 39.0–52.0)
Hemoglobin: 12.7 g/dL — ABNORMAL LOW (ref 13.0–17.0)
MCH: 25.7 pg — ABNORMAL LOW (ref 26.0–34.0)
MCHC: 33.2 g/dL (ref 30.0–36.0)
MCV: 77.5 fL — ABNORMAL LOW (ref 80.0–100.0)
Platelets: 122 10*3/uL — ABNORMAL LOW (ref 150–400)
RBC: 4.94 MIL/uL (ref 4.22–5.81)
RDW: 14.4 % (ref 11.5–15.5)
WBC: 10.4 10*3/uL (ref 4.0–10.5)
nRBC: 0 % (ref 0.0–0.2)

## 2019-10-09 LAB — CK: Total CK: 7523 U/L — ABNORMAL HIGH (ref 49–397)

## 2019-10-09 MED ORDER — FUROSEMIDE 10 MG/ML IJ SOLN
80.0000 mg | Freq: Once | INTRAMUSCULAR | Status: AC
  Administered 2019-10-09: 80 mg via INTRAVENOUS
  Filled 2019-10-09: qty 8

## 2019-10-09 MED ORDER — PROMETHAZINE HCL 25 MG/ML IJ SOLN
6.2500 mg | Freq: Four times a day (QID) | INTRAMUSCULAR | Status: DC | PRN
  Administered 2019-10-09 – 2019-10-13 (×2): 6.25 mg via INTRAVENOUS
  Filled 2019-10-09 (×2): qty 1

## 2019-10-09 NOTE — Progress Notes (Signed)
Physical Therapy Treatment Patient Details Name: Alex Potter MRN: [id number] DOB: 1996-04-28 Today's Date: 10/09/2019    History of Present Illness Alex Potter is an 24 y.o. male with past medical history notable for hyperlipidemia, homeless who was admitted due to rhabdomyolysis after passed out with use of cannabis and ETOH with prolonged down time.    PT Comments    Pt reported he rolled his right ankle 2 days ago when attempting to get to his walker. He has mild tenderness to palpation along lateral malleolus and pain with weightbearing. Encouraged continued elevation, ice, ROM, and progressive mobilization. Pt with 2/5 ankle dorsiflexion this date and able to clear during swing phase of gait. Ambulating hallway distances with a walker at a supervision level. Left tennis shoe utilized to improve clearance with right foot. Education provided on ankle range of motion exercise and towel curls for foot intrinsic strengthening. D/c plan remains appropriate.     Follow Up Recommendations  Outpatient PT     Equipment Recommendations  Rolling walker with 5" wheels    Recommendations for Other Services       Precautions / Restrictions Precautions Precautions: Fall Restrictions Weight Bearing Restrictions: No    Mobility  Bed Mobility Overal bed mobility: Modified Independent                Transfers Overall transfer level: Modified independent Equipment used: Rolling walker (2 wheeled)                Ambulation/Gait Ambulation/Gait assistance: Supervision Gait Distance (Feet): 120 Feet Assistive device: Rolling walker (2 wheeled) Gait Pattern/deviations: Step-to pattern;Step-through pattern;Decreased stride length;Decreased dorsiflexion - right;Decreased step length - left Gait velocity: decreased   General Gait Details: Use of left shoe to provide increased height for right foot to clear during swing phase. Cues for sequence, pt preferring to pick up walker. no  gross imbalance   Stairs             Wheelchair Mobility    Modified Rankin (Stroke Patients Only)       Balance Overall balance assessment: Needs assistance Sitting-balance support: Feet supported Sitting balance-Leahy Scale: Good     Standing balance support: During functional activity;No upper extremity supported;Single extremity supported Standing balance-Leahy Scale: Fair                              Cognition Arousal/Alertness: Awake/alert Behavior During Therapy: WFL for tasks assessed/performed Overall Cognitive Status: Within Functional Limits for tasks assessed                                        Exercises Other Exercises Other Exercises: Seated: right towel curls x 13, right ankle AROM (ABC exercise)    General Comments        Pertinent Vitals/Pain Pain Assessment: 0-10 Pain Score: 7  Pain Location: R foot Pain Descriptors / Indicators: Tingling;Sharp Pain Intervention(s): Monitored during session;Limited activity within patient's tolerance;Repositioned;Ice applied    Home Living                      Prior Function            PT Goals (current goals can now be found in the care plan section) Acute Rehab PT Goals Patient Stated Goal: return to independent PT Goal Formulation: With patient Time For Goal  Achievement: 10/21/19 Potential to Achieve Goals: Good Progress towards PT goals: Progressing toward goals    Frequency    Min 3X/week      PT Plan Current plan remains appropriate    Co-evaluation              AM-PAC PT "6 Clicks" Mobility   Outcome Measure  Help needed turning from your back to your side while in a flat bed without using bedrails?: None Help needed moving from lying on your back to sitting on the side of a flat bed without using bedrails?: None Help needed moving to and from a bed to a chair (including a wheelchair)?: None Help needed standing up from a chair  using your arms (e.g., wheelchair or bedside chair)?: None Help needed to walk in hospital room?: None Help needed climbing 3-5 steps with a railing? : A Little 6 Click Score: 23    End of Session   Activity Tolerance: Patient tolerated treatment well Patient left: in bed;with call bell/phone within reach Nurse Communication: Mobility status PT Visit Diagnosis: Other abnormalities of gait and mobility (R26.89);Muscle weakness (generalized) (M62.81)     Time: 2703-5009 PT Time Calculation (min) (ACUTE ONLY): 41 min  Charges:  $Gait Training: 23-37 mins $Therapeutic Activity: 8-22 mins                       Wyona Almas, PT, DPT Acute Rehabilitation Services Pager [phone] Office [phone]    Deno Etienne 10/09/2019, 11:42 AM

## 2019-10-09 NOTE — Plan of Care (Signed)

## 2019-10-09 NOTE — Progress Notes (Signed)
PROGRESS NOTE    Alex LiasJames Lohn  ZOX:[id number] DOB: [birth year] DOA: 10/03/2019 PCP: Patient, No Pcp Per    Brief Narrative:  Alex Potter is an 24 y.o. male with past medical history notable for hyperlipidemia, homeless who is followed [facility] comes into the ED for feeling bad. UDS was positive for cannabis with a white count of 25 platelet count of 255 and a CK of 15,000.  He admits to cocaine use with multiple beers and that he passed out. He does not know how much time he was out for maybe 6 to 12 hours   Assessment & Plan:   Principal Problem:   Rhabdomyolysis Active Problems:   Tobacco abuse   Polysubstance abuse (HCC)   ATN (acute tubular necrosis) (HCC)   Hyperkalemia   Liver dysfunction   Leukocytosis   Rhabdomyolysis, nontraumatic CK level greater than 50,000 on admission with MRI L-spine notable for muscular inflammation with likely necrosis right quadratus lumborum, Jason's back muscles insistent with pressure necrosis. Etiology likely secondary to acute intoxication with narcotics/beer and on known downtime. --CK >50K, 48K, 24K, 20K, 15.5K, 7.5K --Aggressive IV fluid hydration discontinued secondary to renal failure/volume overload --Continue supportive care --Monitor CK level daily  Renal failure secondary to ATN from rhabdomyolysis Patient presenting with creatinine of 2.42, likely secondary to rhabdomyolysis on commitment illicit drug use. Renal ultrasound with increased cortical echogenicity bilaterally consistent with medical renal disease and no hydronephrosis. --Nephrology following, appreciate assistance --Cr 2.42>>7.87>9.63>11.47>13.28 --UOP 200L past 24h --Possible need for HD if creatinine continues to rise --Monitor renal function daily  Hyperkalemia Potassium 5.8 on admission, likely secondary to renal failure. --Potassium 4.4 today --Continue renal/low potassium diet --Continue to monitor electrolytes closely  Polysubstance  abuse Patient with history of EtOH, cocaine, heroin, THC abuse. UDS on admission positive for THC and opiates. Admitted snorting heroin. Discussed with him the need for complete cessation. Has been to rehab several times in the past per his father but typically relapses soon after discharge. --Social work consultation  Tobacco use disorder: --Nicotine patch  Right ankle/foot pain: Xray right ankle/foot with no acute findings.  PT with no further recommendations.    DVT prophylaxis: Heparin Code Status: Full code Family Communication: None present at bedside  Disposition Plan:  Status is: Inpatient  Remains inpatient appropriate because:Persistent severe electrolyte disturbances, Ongoing diagnostic testing needed not appropriate for outpatient work up, Unsafe d/c plan and IV treatments appropriate due to intensity of illness or inability to take PO   Dispo: The patient is from: Home              Anticipated d/c is to: Home              Anticipated d/c date is: 3 days              Patient currently is not medically stable to d/c.    Consultants:   Nephrology  Procedures:   None  Antimicrobials:   None   Subjective: Patient seen and examined bedside, talking on the phone to "government officials". Continues with muscle aches to his low back/gluteal region and right ankle/foot pain. No other complaints or concerns at this time. Denies headache, no chest pain, no palpitations, no abdominal pain. No acute concerns overnight per nursing staff.  Objective: Vitals:   10/08/19 0405 10/08/19 0722 10/08/19 2005 10/09/19 0814  BP: (!) 144/90 130/87 (!) 141/89 (!) 142/89  Pulse: 72 72 69 71  Resp: 17 16    Temp: 97.7  F (36.5 C) (!) 97.5 F (36.4 C) 98.2 F (36.8 C) 98.4 F (36.9 C)  TempSrc: Oral Oral Oral Oral  SpO2: 96% 97% 99% 95%    Intake/Output Summary (Last 24 hours) at 10/09/2019 1247 Last data filed at 10/09/2019 9381 Gross per 24 hour  Intake 480 ml  Output  1080 ml  Net -600 ml   There were no vitals filed for this visit.  Examination:  General exam: Appears calm and comfortable  Respiratory system: Clear to auscultation. Respiratory effort normal. Cardiovascular system: S1 & S2 heard, RRR. No JVD, murmurs, rubs, gallops or clicks. No pedal edema. Gastrointestinal system: Abdomen is nondistended, soft and nontender. No organomegaly or masses felt. Normal bowel sounds heard. Central nervous system: Alert and oriented. No focal neurological deficits. Extremities: Symmetric 5 x 5 power. Skin: No rashes, lesions or ulcers Psychiatry: Judgement and insight appear normal. Mood & affect appropriate.     Data Reviewed: I have personally reviewed following labs and imaging studies  CBC: Recent Labs  Lab 10/03/19 0732 10/03/19 0732 10/04/19 0403 10/06/19 0556 10/07/19 0417 10/08/19 0419 10/09/19 0620  WBC 25.7*   < > 16.9* 9.6 9.6 9.1 10.4  NEUTROABS 21.7*  --  12.5*  --   --   --   --   HGB 18.0*   < > 16.5 12.2* 12.7* 12.3* 12.7*  HCT 58.4*   < > 52.1* 38.4* 38.7* 37.1* 38.3*  MCV 84.8   < > 80.7 80.5 78.3* 77.8* 77.5*  PLT 255   < > 214 125* 149* 138* 122*   < > = values in this interval not displayed.   Basic Metabolic Panel: Recent Labs  Lab 10/04/19 0403 10/04/19 1516 10/05/19 0006 10/05/19 0006 10/05/19 1225 10/06/19 0556 10/07/19 0417 10/08/19 0419 10/09/19 0620  NA 135   < > 136   < > 134* 135 132* 132* 131*  K 5.4*   < > 5.0   < > 4.8 4.3 4.1 4.1 4.4  CL 101   < > 103   < > 104 103 100 98 100  CO2 23   < > 22   < > 19* 21* 20* 20* 22  GLUCOSE 113*   < > 83   < > 84 84 85 90 93  BUN 37*   < > 43*   < > 48* 56* 67* 77* 89*  CREATININE 3.74*   < > 5.25*   < > 6.08* 7.87* 9.63* 11.47* 13.28*  CALCIUM 8.1*   < > 8.0*   < > 8.0* 8.1* 8.1* 8.3* 8.5*  MG 1.9  --   --   --   --   --   --   --   --   PHOS  --   --  5.8*  --  5.9* 6.7* 7.0*  --   --    < > = values in this interval not displayed.   GFR: CrCl cannot be  calculated (Unknown ideal weight.). Liver Function Tests: Recent Labs  Lab 10/04/19 0403 10/05/19 0006 10/05/19 1225 10/06/19 0556 10/07/19 0417 10/08/19 0419 10/09/19 0620  AST 1,100*  --   --  617* 457* 338* 194*  ALT 626*  --   --  366* 317* 268* 211*  ALKPHOS 80  --   --  82 87 74 64  BILITOT 0.8  --   --  1.1 1.4* 1.4* 2.3*  PROT 5.4*  --   --  4.9* 4.5* 4.8* 4.9*  ALBUMIN 2.5*   < > 2.5* 2.4* 2.3* 2.3* 2.4*   < > = values in this interval not displayed.   No results for input(s): LIPASE, AMYLASE in the last 168 hours. No results for input(s): AMMONIA in the last 168 hours. Coagulation Profile: No results for input(s): INR, PROTIME in the last 168 hours. Cardiac Enzymes: Recent Labs  Lab 10/05/19 0006 10/06/19 0556 10/07/19 0417 10/08/19 0419 10/09/19 0620  CKTOTAL 48,454* 24,180* 20,945* 15,533* 7,523*   BNP (last 3 results) No results for input(s): PROBNP in the last 8760 hours. HbA1C: No results for input(s): HGBA1C in the last 72 hours. CBG: Recent Labs  Lab 10/03/19 1319  GLUCAP 181*   Lipid Profile: No results for input(s): CHOL, HDL, LDLCALC, TRIG, CHOLHDL, LDLDIRECT in the last 72 hours. Thyroid Function Tests: No results for input(s): TSH, T4TOTAL, FREET4, T3FREE, THYROIDAB in the last 72 hours. Anemia Panel: No results for input(s): VITAMINB12, FOLATE, FERRITIN, TIBC, IRON, RETICCTPCT in the last 72 hours. Sepsis Labs: No results for input(s): PROCALCITON, LATICACIDVEN in the last 168 hours.  Recent Results (from the past 240 hour(s))  Culture, blood (routine x 2)     Status: None   Collection Time: 10/03/19  7:32 AM   Specimen: BLOOD  Result Value Ref Range Status   Specimen Description   Final    BLOOD LEFT ANTECUBITAL Performed at [facility], [address], [place], [place] [address]    Special Requests   Final    BOTTLES DRAWN AEROBIC AND ANAEROBIC Blood Culture adequate volume Performed at [facility], [address] [place], [place] [address]    Culture   Final    NO GROWTH 5 DAYS Performed at [facility], [address] [place], [place] [address]    Report Status 10/08/2019 FINAL  Final  SARS Coronavirus 2 by RT PCR (hospital order, performed in [facility] hospital lab) Nasopharyngeal Nasopharyngeal Swab     Status: None   Collection Time: 10/03/19 12:53 PM   Specimen: Nasopharyngeal Swab  Result Value Ref Range Status   SARS Coronavirus 2 NEGATIVE NEGATIVE Final    Comment: (NOTE) SARS-CoV-2 target nucleic acids are NOT DETECTED. The SARS-CoV-2 RNA is generally detectable in upper and lower respiratory specimens during the acute phase of infection. The lowest concentration of SARS-CoV-2 viral copies this assay can detect is 250 copies / mL. A negative result does not preclude SARS-CoV-2 infection and should not be used as the sole basis for treatment or other patient management decisions.  A negative result may occur with improper specimen collection / handling, submission of specimen other than nasopharyngeal swab, presence of viral mutation(s) within the areas targeted by this assay, and inadequate number of viral copies (<250 copies / mL). A negative result must be combined with clinical observations, patient history, and epidemiological information. Fact Sheet for Patients:   BoilerBrush.com.cy Fact Sheet for Healthcare Providers: https://pope.com/ This test is not yet approved or cleared  by the [place] FDA and has been authorized for detection and/or diagnosis of SARS-CoV-2 by FDA under an Emergency Use Authorization (EUA).  This EUA will remain in effect (meaning this test can be used) for the duration of the COVID-19 declaration under Section 564(b)(1) of the Act, 21 U.S.C. section 360bbb-3(b)(1), unless the authorization is terminated or revoked sooner. Performed at [facility], [address] [place], [place] [address]          Radiology Studies:  DG Ankle Complete Right  Result Date: 10/08/2019 CLINICAL DATA:  Right ankle and foot pain EXAM: RIGHT ANKLE - COMPLETE 3+ VIEW COMPARISON:  None. FINDINGS: There is no evidence of fracture, dislocation, or joint effusion. There is no evidence of arthropathy or other focal bone abnormality. Soft tissues are unremarkable. IMPRESSION: Negative. Electronically Signed   By: Jasmine Pang M.D.   On: 10/08/2019 19:43   DG Foot 2 Views Right  Result Date: 10/08/2019 CLINICAL DATA:  Ankle and foot pain EXAM: RIGHT FOOT - 2 VIEW COMPARISON:  None. FINDINGS: There is no evidence of fracture or dislocation. There is no evidence of arthropathy or other focal bone abnormality. Soft tissues are unremarkable. IMPRESSION: Negative. Electronically Signed   By: Jasmine Pang M.D.   On: 10/08/2019 19:43   VAS [place] UPPER EXTREMITY VENOUS DUPLEX  Result Date: 10/08/2019 UPPER VENOUS STUDY  Indications: Swelling Risk Factors: None identified. Comparison Study: No prior studies. Performing Technologist: Chanda Busing RVT  Examination Guidelines: A complete evaluation includes B-mode imaging, spectral Doppler, color Doppler, and power Doppler as needed of all accessible portions of each vessel. Bilateral testing is considered an integral part of a complete examination. Limited examinations for reoccurring indications may be performed as noted.  Right Findings: +----------+------------+---------+-----------+----------+-------+ RIGHT     CompressiblePhasicitySpontaneousPropertiesSummary +----------+------------+---------+-----------+----------+-------+ Subclavian    Full       Yes       Yes                      +----------+------------+---------+-----------+----------+-------+  Left Findings: +----------+------------+---------+-----------+----------+-------+ LEFT      CompressiblePhasicitySpontaneousPropertiesSummary  +----------+------------+---------+-----------+----------+-------+ IJV           Full       Yes       Yes                      +----------+------------+---------+-----------+----------+-------+ Subclavian    Full       Yes       Yes                      +----------+------------+---------+-----------+----------+-------+ Axillary      Full       Yes       Yes                      +----------+------------+---------+-----------+----------+-------+ Brachial      Full       Yes       Yes                      +----------+------------+---------+-----------+----------+-------+ Radial        Full                                          +----------+------------+---------+-----------+----------+-------+ Ulnar         Full                                          +----------+------------+---------+-----------+----------+-------+ Cephalic      Full                                          +----------+------------+---------+-----------+----------+-------+  Basilic       Full                                          +----------+------------+---------+-----------+----------+-------+  Summary:  Right: No evidence of thrombosis in the subclavian.  Left: No evidence of deep vein thrombosis in the upper extremity. No evidence of superficial vein thrombosis in the upper extremity.  *See table(s) above for measurements and observations.    Preliminary         Scheduled Meds: . heparin  5,000 Units Subcutaneous Q8H  . nicotine  14 mg Transdermal Daily  . sodium chloride flush  3 mL Intravenous Q12H   Continuous Infusions:   LOS: 6 days    Time spent: 36 minutes spent on chart review, discussion with nursing staff, consultants, updating family and interview/physical exam; more than 50% of that time was spent in counseling and/or coordination of care.    Quintel Mccalla J [place] DO [facility] Available via Epic secure chat 7am-7pm After these hours, please refer  to coverage provider listed on [email or web address] 10/09/2019, 12:47 PM

## 2019-10-09 NOTE — TOC Initial Note (Signed)
Transition of Care [facility]) - Initial/Assessment Note    Patient Details  Name: Alex Potter MRN: [id number] Date of Birth: 02/26/1996  Transition of Care [facility]) CM/SW Contact:    Curlene Labrum, RN Phone Number: 10/09/2019, 4:30 PM  Clinical Narrative:                 Case management met with the patient who is currently homeless and stays with friends and his girlfriend when he is able.  Patient states that he realizes that he is sick and will need assistance from his family - mainly father after this hospitalization. He states that he would like to stay with the mother of his child in an [place] or with his father in [place].  Patient does not have custody of his 47 month old child since the infant was taken into DSS custody at 100 month old.  Patient states that he has had a previous admission to [facility] in the past - last admission was in 2019.  He states that his legs do not work properly and is concerned about even ambulating with a rolling walker.  Patient was treated at the [facility] on [address] prior to this hospitalization.  Will continue to follow  - medical work up continues.  Expected Discharge Plan: [place] Barriers to Discharge: Continued Medical Work up(Patient for possible workup for dialysis)   Patient Goals and CMS Choice Patient states their goals for this hospitalization and ongoing recovery are:: I plan to go stay with my father or with my girlfriend, Marolyn Hammock after my hospitalization.      Expected Discharge Plan and Services Expected Discharge Plan: [place]   Discharge Planning Services: CM Consult   Living arrangements for the past 2 months: Homeless                                      Prior Living Arrangements/Services Living arrangements for the past 2 months: Homeless Lives with:: Significant Other Patient language and need for interpreter reviewed::  Yes Do you feel safe going back to the place where you live?: Yes      Need for Family Participation in Patient Care: Yes (Comment) Care giver support system in place?: Yes (comment)   Criminal Activity/Legal Involvement Pertinent to Current Situation/Hospitalization: No - Comment as needed  Activities of Daily Living Home Assistive Devices/Equipment: None ADL Screening (condition at time of admission) Patient's cognitive ability adequate to safely complete daily activities?: Yes Is the patient deaf or have difficulty hearing?: No Does the patient have difficulty seeing, even when wearing glasses/contacts?: No Does the patient have difficulty concentrating, remembering, or making decisions?: No Patient able to express need for assistance with ADLs?: Yes Does the patient have difficulty dressing or bathing?: No Independently performs ADLs?: Yes (appropriate for developmental age) Does the patient have difficulty walking or climbing stairs?: Yes Weakness of Legs: Both Weakness of Arms/Hands: None  Permission Sought/Granted Permission sought to share information with : Case Manager Permission granted to share information with : Yes, Verbal Permission Granted     Permission granted to share info w AGENCY: Plans to eventual treatment in [place] granted to share info w Relationship: father and girlfriend     Emotional Assessment Appearance:: Appears stated age Attitude/Demeanor/Rapport: Self-Confident Affect (typically observed): Accepting Orientation: : Oriented  to Self, Oriented to Place, Oriented to  Time, Oriented to Situation Alcohol / Substance Use: Illicit Drugs, Alcohol Use(Patient with use history of marijuana, methamphetamines, alcohol) Psych Involvement: No (comment)  Admission diagnosis:  Rhabdomyolysis [M62.82] Hyperkalemia [E87.5] Polysubstance abuse ([place]) [F19.10] AKI (acute kidney injury) ([place]) [N17.9] Traumatic rhabdomyolysis, initial  encounter ([place]) [T79.6XXA] Acute liver failure without hepatic coma [K72.00] Patient Active Problem List   Diagnosis Date Noted  . Rhabdomyolysis 10/03/2019  . Hyperkalemia 10/03/2019  . Liver dysfunction 10/03/2019  . Leukocytosis 10/03/2019  . Syphilis   . Aspiration pneumonia of left lung ([place]) 07/30/2019  . Diffuse papular rash - rule out syphilis vs scabies 07/30/2019  . ATN (acute tubular necrosis) ([place]) 07/30/2019  . Heroin overdose ([place]) 07/30/2019  . Heroin overdose, accidental or unintentional, initial encounter ([place])   . Acute encephalopathy   . Sepsis ([place]) 11/03/2017  . Acute metabolic encephalopathy 03/29/5207  . Acute respiratory failure with hypoxia ([place]) 11/03/2017  . Aspiration into airway   . Lactic acidosis   . Choledocholithiasis   . Abnormal liver function 07/04/2017  . Depression 07/04/2017  . Acute cholecystitis 07/03/2017  . Gallstone 07/03/2017  . Right upper quadrant abdominal pain 07/03/2017  . Cholecystitis 07/03/2017  . Tobacco abuse   . Polysubstance abuse ([place])    PCP:  Patient, No Pcp Per Pharmacy:   [id number] [place] [place] [address]Phone: [phone]: [phone]    Social Determinants of Health (SDOH) Interventions    Readmission Risk Interventions Readmission Risk Prevention Plan 10/09/2019  Transportation Screening Complete  Medication Review (RN Care Manager) Complete  PCP or Specialist appointment within 3-5 days of discharge Complete  [place] Recovery Care/Counseling Consult Complete  Palliative Care Screening Complete  Skilled Nursing Facility Complete

## 2019-10-09 NOTE — Progress Notes (Signed)
[facility] Progress Note   Subjective:   Urine output was robust after receiving Lasix in the past but yesterday urine output not as good or poorly documented.  He is complaining of abdominal pain and swelling in his arm.  He feels like things are going to burst.  He is very uncomfortable and anxious.  He notes some mild nausea but no significant confusion  Objective Vitals:   10/08/19 0405 10/08/19 0722 10/08/19 2005 10/09/19 0814  BP: (!) 144/90 130/87 (!) 141/89 (!) 142/89  Pulse: 72 72 69 71  Resp: 17 16    Temp: 97.7 F (36.5 C) (!) 97.5 F (36.4 C) 98.2 F (36.8 C) 98.4 F (36.9 C)  TempSrc: Oral Oral Oral Oral  SpO2: 96% 97% 99% 95%   Physical Exam General: Intermittent discomfort, sitting in bed Heart normal rate, no audible murmur Lungs: Bilateral chest rise, no increased work of breathing Abdomen: soft, nontender, mild distention Extremities: 2+ pitting edema in the bilateral lower extremity, 1+ pitting edema on left upper extremity Neuro: conversant, alert and oriented  Additional Objective Labs: Basic Metabolic Panel: Recent Labs  Lab 10/05/19 1225 10/05/19 1225 10/06/19 0556 10/06/19 0556 10/07/19 0417 10/08/19 0419 10/09/19 0620  NA 134*   < > 135   < > 132* 132* 131*  K 4.8   < > 4.3   < > 4.1 4.1 4.4  CL 104   < > 103   < > 100 98 100  CO2 19*   < > 21*   < > 20* 20* 22  GLUCOSE 84   < > 84   < > 85 90 93  BUN 48*   < > 56*   < > 67* 77* 89*  CREATININE 6.08*   < > 7.87*   < > 9.63* 11.47* 13.28*  CALCIUM 8.0*   < > 8.1*   < > 8.1* 8.3* 8.5*  PHOS 5.9*  --  6.7*  --  7.0*  --   --    < > = values in this interval not displayed.   Liver Function Tests: Recent Labs  Lab 10/07/19 0417 10/08/19 0419 10/09/19 0620  AST 457* 338* 194*  ALT 317* 268* 211*  ALKPHOS 87 74 64  BILITOT 1.4* 1.4* 2.3*  PROT 4.5* 4.8* 4.9*  ALBUMIN 2.3* 2.3* 2.4*   No results for input(s): LIPASE, AMYLASE in the last 168 hours. CBC: Recent Labs   Lab 10/03/19 0732 10/03/19 0732 10/04/19 0403 10/04/19 0403 10/06/19 0556 10/06/19 0556 10/07/19 0417 10/08/19 0419 10/09/19 0620  WBC 25.7*   < > 16.9*   < > 9.6   < > 9.6 9.1 10.4  NEUTROABS 21.7*  --  12.5*  --   --   --   --   --   --   HGB 18.0*   < > 16.5   < > 12.2*   < > 12.7* 12.3* 12.7*  HCT 58.4*   < > 52.1*   < > 38.4*   < > 38.7* 37.1* 38.3*  MCV 84.8   < > 80.7  --  80.5  --  78.3* 77.8* 77.5*  PLT 255   < > 214   < > 125*   < > 149* 138* 122*   < > = values in this interval not displayed.   Blood Culture    Component Value Date/Time   SDES  10/03/2019 0732    BLOOD LEFT ANTECUBITAL Performed at [facility], [place] [address] [place], [place] [address]    SPECREQUEST  10/03/2019 0732    BOTTLES DRAWN AEROBIC AND ANAEROBIC Blood Culture adequate volume Performed at [place] [address], [place], [place] [address]    CULT  10/03/2019 0732    NO GROWTH 5 DAYS Performed at [facility] [address], [place], [place] [address]    REPTSTATUS 10/08/2019 FINAL 10/03/2019 0732    Cardiac Enzymes: Recent Labs  Lab 10/05/19 0006 10/06/19 0556 10/07/19 0417 10/08/19 0419 10/09/19 0620  CKTOTAL 48,454* 24,180* 20,945* 15,533* 7,523*   CBG: Recent Labs  Lab 10/03/19 1319  GLUCAP 181*   Iron Studies: No results for input(s): IRON, TIBC, TRANSFERRIN, FERRITIN in the last 72 hours. @lablastinr3 @ Studies/Results: DG Ankle Complete Right  Result Date: 10/08/2019 CLINICAL DATA:  Right ankle and foot pain EXAM: RIGHT ANKLE - COMPLETE 3+ VIEW COMPARISON:  None. FINDINGS: There is no evidence of fracture, dislocation, or joint effusion. There is no evidence of arthropathy or other focal bone abnormality. Soft tissues are unremarkable. IMPRESSION: Negative. Electronically Signed   By: Donavan Foil M.D.   On: 10/08/2019 19:43   DG Foot 2 Views Right  Result Date: 10/08/2019 CLINICAL DATA:  Ankle and foot pain  EXAM: RIGHT FOOT - 2 VIEW COMPARISON:  None. FINDINGS: There is no evidence of fracture or dislocation. There is no evidence of arthropathy or other focal bone abnormality. Soft tissues are unremarkable. IMPRESSION: Negative. Electronically Signed   By: Donavan Foil M.D.   On: 10/08/2019 19:43   VAS [place] UPPER EXTREMITY VENOUS DUPLEX  Result Date: 10/08/2019 UPPER VENOUS STUDY  Indications: Swelling Risk Factors: None identified. Comparison Study: No prior studies. Performing Technologist: Oliver Hum RVT  Examination Guidelines: A complete evaluation includes B-mode imaging, spectral Doppler, color Doppler, and power Doppler as needed of all accessible portions of each vessel. Bilateral testing is considered an integral part of a complete examination. Limited examinations for reoccurring indications may be performed as noted.  Right Findings: +----------+------------+---------+-----------+----------+-------+ RIGHT     CompressiblePhasicitySpontaneousPropertiesSummary +----------+------------+---------+-----------+----------+-------+ Subclavian    Full       Yes       Yes                      +----------+------------+---------+-----------+----------+-------+  Left Findings: +----------+------------+---------+-----------+----------+-------+ LEFT      CompressiblePhasicitySpontaneousPropertiesSummary +----------+------------+---------+-----------+----------+-------+ IJV           Full       Yes       Yes                      +----------+------------+---------+-----------+----------+-------+ Subclavian    Full       Yes       Yes                      +----------+------------+---------+-----------+----------+-------+ Axillary      Full       Yes       Yes                      +----------+------------+---------+-----------+----------+-------+ Brachial      Full       Yes       Yes                       +----------+------------+---------+-----------+----------+-------+ Radial        Full                                          +----------+------------+---------+-----------+----------+-------+  Ulnar         Full                                          +----------+------------+---------+-----------+----------+-------+ Cephalic      Full                                          +----------+------------+---------+-----------+----------+-------+ Basilic       Full                                          +----------+------------+---------+-----------+----------+-------+  Summary:  Right: No evidence of thrombosis in the subclavian.  Left: No evidence of deep vein thrombosis in the upper extremity. No evidence of superficial vein thrombosis in the upper extremity.  *See table(s) above for measurements and observations.    Preliminary    Medications:  . furosemide  80 mg Intravenous Once  . heparin  5,000 Units Subcutaneous Q8H  . nicotine  14 mg Transdermal Daily  . sodium chloride flush  3 mL Intravenous Q12H    Assessment/ Plan: 1. AKI, severe, oliguric- due to acute rhabdomyolysis related to intoxication/ drug use.      Continues to have rising creatinine and BUN but no signs of uremia and electrolytes acceptable.  Continuing supportive care as tolerated with hopes that his creatinine will improve.  If his urine output is low tomorrow we may need to consider placing a temporary catheter for dialysis.   2. Volume overload: Present in left upper extremity as well as lower extremities and abdomen causing discomfort.  We will give IV Lasix 80 mg once a day. 3. Opioid abuse - has been to rehab several times per Dad, relapses soon after discharged.  Has been counselled  4. Acute rhabdomyolysis - traumatic in nature.  Improving 5. Hyperkalemia -secondary to AKI high cellular turnover from rhabdo.  Now resolved.

## 2019-10-10 LAB — COMPREHENSIVE METABOLIC PANEL
ALT: 180 U/L — ABNORMAL HIGH (ref 0–44)
AST: 132 U/L — ABNORMAL HIGH (ref 15–41)
Albumin: 2.7 g/dL — ABNORMAL LOW (ref 3.5–5.0)
Alkaline Phosphatase: 61 U/L (ref 38–126)
Anion gap: 15 (ref 5–15)
BUN: 105 mg/dL — ABNORMAL HIGH (ref 6–20)
CO2: 22 mmol/L (ref 22–32)
Calcium: 8.8 mg/dL — ABNORMAL LOW (ref 8.9–10.3)
Chloride: 96 mmol/L — ABNORMAL LOW (ref 98–111)
Creatinine, Ser: 14.05 mg/dL — ABNORMAL HIGH (ref 0.61–1.24)
GFR calc Af Amer: 5 mL/min — ABNORMAL LOW (ref >60)
GFR calc non Af Amer: 4 mL/min — ABNORMAL LOW (ref >60)
Glucose, Bld: 99 mg/dL (ref 70–99)
Potassium: 5.1 mmol/L (ref 3.5–5.1)
Sodium: 133 mmol/L — ABNORMAL LOW (ref 135–145)
Total Bilirubin: 2 mg/dL — ABNORMAL HIGH (ref 0.3–1.2)
Total Protein: 5.2 g/dL — ABNORMAL LOW (ref 6.5–8.1)

## 2019-10-10 LAB — CK: Total CK: 4806 U/L — ABNORMAL HIGH (ref 49–397)

## 2019-10-10 LAB — CBC
HCT: 38.4 % — ABNORMAL LOW (ref 39.0–52.0)
Hemoglobin: 13 g/dL (ref 13.0–17.0)
MCH: 26.1 pg (ref 26.0–34.0)
MCHC: 33.9 g/dL (ref 30.0–36.0)
MCV: 77.1 fL — ABNORMAL LOW (ref 80.0–100.0)
Platelets: 142 10*3/uL — ABNORMAL LOW (ref 150–400)
RBC: 4.98 MIL/uL (ref 4.22–5.81)
RDW: 14.9 % (ref 11.5–15.5)
WBC: 11.5 10*3/uL — ABNORMAL HIGH (ref 4.0–10.5)
nRBC: 0 % (ref 0.0–0.2)

## 2019-10-10 MED ORDER — FUROSEMIDE 10 MG/ML IJ SOLN
80.0000 mg | Freq: Once | INTRAMUSCULAR | Status: AC
  Administered 2019-10-10: 80 mg via INTRAVENOUS
  Filled 2019-10-10: qty 8

## 2019-10-10 NOTE — Progress Notes (Signed)
[facility] Progress Note   Subjective:   Patient more tired today and sleeping.  Wakes up easily and answers questions.  Denies any pain and does not have any nausea.  Feels like his appetite is improving.  He feels like his urine output was better yesterday.  Objective Vitals:   10/10/19 0300 10/10/19 0600 10/10/19 1153 10/10/19 1323  BP: 122/82  (!) 146/92 129/85  Pulse: 94  68 85  Resp: 16  16 16   Temp: 98 F (36.7 C)   98.3 F (36.8 C)  TempSrc: Oral  Oral Oral  SpO2: 98%  99% 94%  Weight:  78.5 kg 76.7 kg   Height:   5\' 5"  (1.651 m)    Physical Exam General: Resting in bed, no distress Heart normal rate, no audible murmur Lungs: Bilateral chest rise, no increased work of breathing Abdomen: soft, nontender, mild distention Extremities: 2+ pitting edema in the bilateral lower extremity, 1+ pitting edema on left upper extremity Neuro: Tired but awakes easily, alert and oriented  Additional Objective Labs: Basic Metabolic Panel: Recent Labs  Lab 10/05/19 1225 10/05/19 1225 10/06/19 0556 10/06/19 0556 10/07/19 0417 10/07/19 0417 10/08/19 0419 10/09/19 0620 10/10/19 0953  NA 134*   < > 135   < > 132*   < > 132* 131* 133*  K 4.8   < > 4.3   < > 4.1   < > 4.1 4.4 5.1  CL 104   < > 103   < > 100   < > 98 100 96*  CO2 19*   < > 21*   < > 20*   < > 20* 22 22  GLUCOSE 84   < > 84   < > 85   < > 90 93 99  BUN 48*   < > 56*   < > 67*   < > 77* 89* 105*  CREATININE 6.08*   < > 7.87*   < > 9.63*   < > 11.47* 13.28* 14.05*  CALCIUM 8.0*   < > 8.1*   < > 8.1*   < > 8.3* 8.5* 8.8*  PHOS 5.9*  --  6.7*  --  7.0*  --   --   --   --    < > = values in this interval not displayed.   Liver Function Tests: Recent Labs  Lab 10/08/19 0419 10/09/19 0620 10/10/19 0953  AST 338* 194* 132*  ALT 268* 211* 180*  ALKPHOS 74 64 61  BILITOT 1.4* 2.3* 2.0*  PROT 4.8* 4.9* 5.2*  ALBUMIN 2.3* 2.4* 2.7*   No results for input(s): LIPASE, AMYLASE in the last 168  hours. CBC: Recent Labs  Lab 10/04/19 0403 10/04/19 0403 10/06/19 0556 10/06/19 0556 10/07/19 0417 10/07/19 0417 10/08/19 0419 10/09/19 0620 10/10/19 0953  WBC 16.9*   < > 9.6   < > 9.6   < > 9.1 10.4 11.5*  NEUTROABS 12.5*  --   --   --   --   --   --   --   --   HGB 16.5   < > 12.2*   < > 12.7*   < > 12.3* 12.7* 13.0  HCT 52.1*   < > 38.4*   < > 38.7*   < > 37.1* 38.3* 38.4*  MCV 80.7   < > 80.5  --  78.3*  --  77.8* 77.5* 77.1*  PLT 214   < > 125*   < > 149*   < >  138* 122* 142*   < > = values in this interval not displayed.   Blood Culture    Component Value Date/Time   SDES  10/03/2019 0732    BLOOD LEFT ANTECUBITAL Performed at [facility], [address], [place], [place] [address]    SPECREQUEST  10/03/2019 0732    BOTTLES DRAWN AEROBIC AND ANAEROBIC Blood Culture adequate volume Performed at [facility], [address] [place], [place] [address]    CULT  10/03/2019 0732    NO GROWTH 5 DAYS Performed at [facility], [address] [place], [place] [address]    REPTSTATUS 10/08/2019 FINAL 10/03/2019 0732    Cardiac Enzymes: Recent Labs  Lab 10/06/19 0556 10/07/19 0417 10/08/19 0419 10/09/19 0620 10/10/19 0953  CKTOTAL 24,180* 20,945* 15,533* 7,523* 4,806*   CBG: No results for input(s): GLUCAP in the last 168 hours. Iron Studies: No results for input(s): IRON, TIBC, TRANSFERRIN, FERRITIN in the last 72 hours. @lablastinr3 @ Studies/Results: DG Ankle Complete Right  Result Date: 10/08/2019 CLINICAL DATA:  Right ankle and foot pain EXAM: RIGHT ANKLE - COMPLETE 3+ VIEW COMPARISON:  None. FINDINGS: There is no evidence of fracture, dislocation, or joint effusion. There is no evidence of arthropathy or other focal bone abnormality. Soft tissues are unremarkable. IMPRESSION: Negative. Electronically Signed   By: 10/10/2019 M.D.   On: 10/08/2019 19:43   DG Foot 2 Views Right  Result Date:  10/08/2019 CLINICAL DATA:  Ankle and foot pain EXAM: RIGHT FOOT - 2 VIEW COMPARISON:  None. FINDINGS: There is no evidence of fracture or dislocation. There is no evidence of arthropathy or other focal bone abnormality. Soft tissues are unremarkable. IMPRESSION: Negative. Electronically Signed   By: 10/10/2019 M.D.   On: 10/08/2019 19:43   VAS 10/10/2019 UPPER EXTREMITY VENOUS DUPLEX  Result Date: 10/08/2019 UPPER VENOUS STUDY  Indications: Swelling Risk Factors: None identified. Comparison Study: No prior studies. Performing Technologist: 10/10/2019 RVT  Examination Guidelines: A complete evaluation includes B-mode imaging, spectral Doppler, color Doppler, and power Doppler as needed of all accessible portions of each vessel. Bilateral testing is considered an integral part of a complete examination. Limited examinations for reoccurring indications may be performed as noted.  Right Findings: +----------+------------+---------+-----------+----------+-------+ RIGHT     CompressiblePhasicitySpontaneousPropertiesSummary +----------+------------+---------+-----------+----------+-------+ Subclavian    Full       Yes       Yes                      +----------+------------+---------+-----------+----------+-------+  Left Findings: +----------+------------+---------+-----------+----------+-------+ LEFT      CompressiblePhasicitySpontaneousPropertiesSummary +----------+------------+---------+-----------+----------+-------+ IJV           Full       Yes       Yes                      +----------+------------+---------+-----------+----------+-------+ Subclavian    Full       Yes       Yes                      +----------+------------+---------+-----------+----------+-------+ Axillary      Full       Yes       Yes                      +----------+------------+---------+-----------+----------+-------+ Brachial      Full       Yes  Yes                       +----------+------------+---------+-----------+----------+-------+ Radial        Full                                          +----------+------------+---------+-----------+----------+-------+ Ulnar         Full                                          +----------+------------+---------+-----------+----------+-------+ Cephalic      Full                                          +----------+------------+---------+-----------+----------+-------+ Basilic       Full                                          +----------+------------+---------+-----------+----------+-------+  Summary:  Right: No evidence of thrombosis in the subclavian.  Left: No evidence of deep vein thrombosis in the upper extremity. No evidence of superficial vein thrombosis in the upper extremity.  *See table(s) above for measurements and observations.    Preliminary    Medications:  . heparin  5,000 Units Subcutaneous Q8H  . nicotine  14 mg Transdermal Daily  . sodium chloride flush  3 mL Intravenous Q12H    Assessment/ Plan: 1. AKI, severe, oliguric- due to acute rhabdomyolysis related to intoxication/ drug use.  Unfortunately BUN and creatinine continue to rise but seem to be plateauing.  I am concerned that he may be finally developing uremic symptoms with this fatigue however he reports his appetite and nausea are minimal and improved.  If his BUN is continue to rise significantly and he remains withdrawn tomorrow we may have to plan for dialysis Monday.  1. Monitor creatinine tomorrow 2. Monitor for confusion, nausea, worsening fatigue 3. Consider dialysis based on labs, clinical presentation, and urine output tomorrow 4. Avoid any nephrotoxic agents at this time 2. Volume overload: Pain associated with edema improved today.  Will redose Lasix IV 80 mg today 3. Opioid abuse - has been to rehab several times per Dad, relapses soon after discharged.  Has been counselled  4. Acute rhabdomyolysis -  traumatic in nature.  Improving 5. Hyperkalemia -secondary to AKI high cellular turnover from rhabdo.  Significantly improved with only mild elevation of 5.1 today.  We will continue to monitor.

## 2019-10-10 NOTE — Progress Notes (Addendum)
PROGRESS NOTE    Alex Potter  PPI:[id number] DOB: 12-14-1995 DOA: 10/03/2019 PCP: Patient, No Pcp Per    Brief Narrative:  Alex Potter is an 24 y.o. male with past medical history notable for hyperlipidemia, homeless who is followed [facility] comes into the ED for feeling bad. UDS was positive for cannabis with a white count of 25 platelet count of 255 and a CK of 15,000.  He admits to cocaine use with multiple beers and that he passed out. He does not know how much time he was out for maybe 6 to 12 hours   Assessment & Plan:   Principal Problem:   Rhabdomyolysis Active Problems:   Tobacco abuse   Polysubstance abuse (HCC)   ATN (acute tubular necrosis) (HCC)   Hyperkalemia   Liver dysfunction   Leukocytosis   Rhabdomyolysis, nontraumatic CK level greater than 50,000 on admission with MRI L-spine notable for muscular inflammation with likely necrosis right quadratus lumborum, Jason's back muscles insistent with pressure necrosis. Etiology likely secondary to acute intoxication with narcotics/beer and on known downtime. --CK >50K, 48K, 24K, 20K, 15.5K, 7.5K, 4.8K --Aggressive IV fluid hydration discontinued secondary to renal failure/volume overload --Continue supportive care --Monitor CK level daily  Renal failure secondary to ATN from rhabdomyolysis Patient presenting with creatinine of 2.42, likely secondary to rhabdomyolysis on commitment illicit drug use. Renal ultrasound with increased cortical echogenicity bilaterally consistent with medical renal disease and no hydronephrosis. --Nephrology following, appreciate assistance --Cr 2.42>>7.87>9.63>11.47>13.28>105 --UOP 680L past 24h --Lasix 80mg  IV x 1 today --Possible need for HD if creatinine continues to rise per Nephrology --Monitor renal function daily  Hyperkalemia Potassium 5.8 on admission, likely secondary to renal failure. --Potassium 5.1 today --Continue renal/low potassium diet --Continue to  monitor electrolytes closely  Elevated LFTs Etiology likely secondary to shock liver versus rhabdomyolysis. --AST 1659>1753>>132 --ALT 758>827>180 --Monitor CMP daily  Polysubstance abuse Patient with history of EtOH, cocaine, heroin, THC abuse. UDS on admission positive for THC and opiates. Admitted snorting heroin. Discussed with him the need for complete cessation. Has been to rehab several times in the past per his father but typically relapses soon after discharge. --Social work following  Tobacco use disorder: --Nicotine patch  Right ankle/foot pain: Xray right ankle/foot with no acute findings.  PT with no further recommendations.  Right upper extremity edema Vascular duplex ultrasound right upper extremity negative for DVT or superficial venous thrombosis.  Etiology of swelling likely secondary to excessive volume secondary to poor renal function as above.    DVT prophylaxis: Heparin Code Status: Full code Family Communication: None present at bedside  Disposition Plan:  Status is: Inpatient  Remains inpatient appropriate because:Persistent severe electrolyte disturbances, Ongoing diagnostic testing needed not appropriate for outpatient work up, Unsafe d/c plan and IV treatments appropriate due to intensity of illness or inability to take PO   Dispo: The patient is from: Home              Anticipated d/c is to: Home              Anticipated d/c date is: 3 days              Patient currently is not medically stable to d/c.    Consultants:   Nephrology  Procedures:   None  Antimicrobials:   None   Subjective: Patient seen and examined bedside, sleeping but easily arousable.  Reports difficulty ambulating and right upper extremity edema.  Still awaiting lab draw earlier this morning.  Report continues to make urine. No other complaints or concerns at this time. Denies headache, no chest pain, no palpitations, no abdominal pain. No acute concerns overnight per  nursing staff.  Objective: Vitals:   10/09/19 2051 10/10/19 0300 10/10/19 0600 10/10/19 1153  BP: (!) 156/107 122/82  (!) 146/92  Pulse: (!) 108 94  68  Resp:  16  16  Temp: 97.7 F (36.5 C) 98 F (36.7 C)    TempSrc: Oral Oral  Oral  SpO2: 96% 98%  99%  Weight:   78.5 kg 76.7 kg  Height:    5\' 5"  (1.651 m)    Intake/Output Summary (Last 24 hours) at 10/10/2019 1320 Last data filed at 10/10/2019 1027 Gross per 24 hour  Intake 3 ml  Output 240 ml  Net -237 ml   Filed Weights   10/10/19 0600 10/10/19 1153  Weight: 78.5 kg 76.7 kg    Examination:  General exam: Appears calm and comfortable  Respiratory system: Clear to auscultation. Respiratory effort normal. Cardiovascular system: S1 & S2 heard, RRR. No JVD, murmurs, rubs, gallops or clicks. No pedal edema. Gastrointestinal system: Abdomen is nondistended, soft and nontender. No organomegaly or masses felt. Normal bowel sounds heard. Central nervous system: Alert and oriented. No focal neurological deficits. Extremities: Symmetric 5 x 5 power. Skin: No rashes, lesions or ulcers Psychiatry: Judgement and insight appear normal. Mood & affect appropriate.     Data Reviewed: I have personally reviewed following labs and imaging studies  CBC: Recent Labs  Lab 10/04/19 0403 10/04/19 0403 10/06/19 0556 10/07/19 0417 10/08/19 0419 10/09/19 0620 10/10/19 0953  WBC 16.9*   < > 9.6 9.6 9.1 10.4 11.5*  NEUTROABS 12.5*  --   --   --   --   --   --   HGB 16.5   < > 12.2* 12.7* 12.3* 12.7* 13.0  HCT 52.1*   < > 38.4* 38.7* 37.1* 38.3* 38.4*  MCV 80.7   < > 80.5 78.3* 77.8* 77.5* 77.1*  PLT 214   < > 125* 149* 138* 122* 142*   < > = values in this interval not displayed.   Basic Metabolic Panel: Recent Labs  Lab 10/04/19 0403 10/04/19 1516 10/05/19 0006 10/05/19 0006 10/05/19 1225 10/05/19 1225 10/06/19 0556 10/07/19 0417 10/08/19 0419 10/09/19 0620 10/10/19 0953  NA 135   < > 136   < > 134*   < > 135 132* 132*  131* 133*  K 5.4*   < > 5.0   < > 4.8   < > 4.3 4.1 4.1 4.4 5.1  CL 101   < > 103   < > 104   < > 103 100 98 100 96*  CO2 23   < > 22   < > 19*   < > 21* 20* 20* 22 22  GLUCOSE 113*   < > 83   < > 84   < > 84 85 90 93 99  BUN 37*   < > 43*   < > 48*   < > 56* 67* 77* 89* 105*  CREATININE 3.74*   < > 5.25*   < > 6.08*   < > 7.87* 9.63* 11.47* 13.28* 14.05*  CALCIUM 8.1*   < > 8.0*   < > 8.0*   < > 8.1* 8.1* 8.3* 8.5* 8.8*  MG 1.9  --   --   --   --   --   --   --   --   --   --  PHOS  --   --  5.8*  --  5.9*  --  6.7* 7.0*  --   --   --    < > = values in this interval not displayed.   GFR: Estimated Creatinine Clearance: 7.8 mL/min (A) (by C-G formula based on SCr of 14.05 mg/dL (H)). Liver Function Tests: Recent Labs  Lab 10/06/19 0556 10/07/19 0417 10/08/19 0419 10/09/19 0620 10/10/19 0953  AST 617* 457* 338* 194* 132*  ALT 366* 317* 268* 211* 180*  ALKPHOS 82 87 74 64 61  BILITOT 1.1 1.4* 1.4* 2.3* 2.0*  PROT 4.9* 4.5* 4.8* 4.9* 5.2*  ALBUMIN 2.4* 2.3* 2.3* 2.4* 2.7*   No results for input(s): LIPASE, AMYLASE in the last 168 hours. No results for input(s): AMMONIA in the last 168 hours. Coagulation Profile: No results for input(s): INR, PROTIME in the last 168 hours. Cardiac Enzymes: Recent Labs  Lab 10/06/19 0556 10/07/19 0417 10/08/19 0419 10/09/19 0620 10/10/19 0953  CKTOTAL 24,180* 20,945* 15,533* 7,523* 4,806*   BNP (last 3 results) No results for input(s): PROBNP in the last 8760 hours. HbA1C: No results for input(s): HGBA1C in the last 72 hours. CBG: No results for input(s): GLUCAP in the last 168 hours. Lipid Profile: No results for input(s): CHOL, HDL, LDLCALC, TRIG, CHOLHDL, LDLDIRECT in the last 72 hours. Thyroid Function Tests: No results for input(s): TSH, T4TOTAL, FREET4, T3FREE, THYROIDAB in the last 72 hours. Anemia Panel: No results for input(s): VITAMINB12, FOLATE, FERRITIN, TIBC, IRON, RETICCTPCT in the last 72 hours. Sepsis Labs: No  results for input(s): PROCALCITON, LATICACIDVEN in the last 168 hours.  Recent Results (from the past 240 hour(s))  Culture, blood (routine x 2)     Status: None   Collection Time: 10/03/19  7:32 AM   Specimen: BLOOD  Result Value Ref Range Status   Specimen Description   Final    BLOOD LEFT ANTECUBITAL Performed at [facility], [address], [place], [place] [address]    Special Requests   Final    BOTTLES DRAWN AEROBIC AND ANAEROBIC Blood Culture adequate volume Performed at [facility], [address] [place], [place] [address]    Culture   Final    NO GROWTH 5 DAYS Performed at [facility], [address], [place], [place] [address]    Report Status 10/08/2019 FINAL  Final  SARS Coronavirus 2 by RT PCR (hospital order, performed in [facility] hospital lab) Nasopharyngeal Nasopharyngeal Swab     Status: None   Collection Time: 10/03/19 12:53 PM   Specimen: Nasopharyngeal Swab  Result Value Ref Range Status   SARS Coronavirus 2 NEGATIVE NEGATIVE Final    Comment: (NOTE) SARS-CoV-2 target nucleic acids are NOT DETECTED. The SARS-CoV-2 RNA is generally detectable in upper and lower respiratory specimens during the acute phase of infection. The lowest concentration of SARS-CoV-2 viral copies this assay can detect is 250 copies / mL. A negative result does not preclude SARS-CoV-2 infection and should not be used as the sole basis for treatment or other patient management decisions.  A negative result may occur with improper specimen collection / handling, submission of specimen other than nasopharyngeal swab, presence of viral mutation(s) within the areas targeted by this assay, and inadequate number of viral copies (<250 copies / mL). A negative result must be combined with clinical observations, patient history, and epidemiological information. Fact Sheet for Patients:   BoilerBrush.com.cy Fact Sheet for  Healthcare Providers: https://pope.com/ This test  is not yet approved or cleared  by the [place] FDA and has been authorized for detection and/or diagnosis of SARS-CoV-2 by FDA under an Emergency Use Authorization (EUA).  This EUA will remain in effect (meaning this test can be used) for the duration of the COVID-19 declaration under Section 564(b)(1) of the Act, 21 U.S.C. section 360bbb-3(b)(1), unless the authorization is terminated or revoked sooner. Performed at [facility], [address] [place], [place] [address]          Radiology Studies: DG Ankle Complete Right  Result Date: 10/08/2019 CLINICAL DATA:  Right ankle and foot pain EXAM: RIGHT ANKLE - COMPLETE 3+ VIEW COMPARISON:  None. FINDINGS: There is no evidence of fracture, dislocation, or joint effusion. There is no evidence of arthropathy or other focal bone abnormality. Soft tissues are unremarkable. IMPRESSION: Negative. Electronically Signed   By: Jasmine PangKim  Fujinaga M.D.   On: 10/08/2019 19:43   DG Foot 2 Views Right  Result Date: 10/08/2019 CLINICAL DATA:  Ankle and foot pain EXAM: RIGHT FOOT - 2 VIEW COMPARISON:  None. FINDINGS: There is no evidence of fracture or dislocation. There is no evidence of arthropathy or other focal bone abnormality. Soft tissues are unremarkable. IMPRESSION: Negative. Electronically Signed   By: Jasmine PangKim  Fujinaga M.D.   On: 10/08/2019 19:43   VAS US UPPER EXTREMITY VENOUS DUPLEX  Result Date: 10/08/2019 UPPER VENOUS STUDY  Indications: Swelling Risk Factors: None identified. Comparison Study: No prior studies. Performing Technologist: Chanda BusingGregory Collins RVT  Examination Guidelines: A complete evaluation includes B-mode imaging, spectral Doppler, color Doppler, and power Doppler as needed of all accessible portions of each vessel. Bilateral testing is considered an integral part of a complete examination. Limited examinations for reoccurring indications  may be performed as noted.  Right Findings: +----------+------------+---------+-----------+----------+-------+ RIGHT     CompressiblePhasicitySpontaneousPropertiesSummary +----------+------------+---------+-----------+----------+-------+ Subclavian    Full       Yes       Yes                      +----------+------------+---------+-----------+----------+-------+  Left Findings: +----------+------------+---------+-----------+----------+-------+ LEFT      CompressiblePhasicitySpontaneousPropertiesSummary +----------+------------+---------+-----------+----------+-------+ IJV           Full       Yes       Yes                      +----------+------------+---------+-----------+----------+-------+ Subclavian    Full       Yes       Yes                      +----------+------------+---------+-----------+----------+-------+ Axillary      Full       Yes       Yes                      +----------+------------+---------+-----------+----------+-------+ Brachial      Full       Yes       Yes                      +----------+------------+---------+-----------+----------+-------+ Radial        Full                                          +----------+------------+---------+-----------+----------+-------+ Ulnar  Full                                          +----------+------------+---------+-----------+----------+-------+ Cephalic      Full                                          +----------+------------+---------+-----------+----------+-------+ Basilic       Full                                          +----------+------------+---------+-----------+----------+-------+  Summary:  Right: No evidence of thrombosis in the subclavian.  Left: No evidence of deep vein thrombosis in the upper extremity. No evidence of superficial vein thrombosis in the upper extremity.  *See table(s) above for measurements and observations.    Preliminary          Scheduled Meds: . heparin  5,000 Units Subcutaneous Q8H  . nicotine  14 mg Transdermal Daily  . sodium chloride flush  3 mL Intravenous Q12H   Continuous Infusions:   LOS: 7 days    Time spent: 35 minutes spent on chart review, discussion with nursing staff, consultants, updating family and interview/physical exam; more than 50% of that time was spent in counseling and/or coordination of care.    Jameria Bradway J [place] DO [facility] Available via Epic secure chat 7am-7pm After these hours, please refer to coverage provider listed on [email or web address] 10/10/2019, 1:20 PM

## 2019-10-10 NOTE — Plan of Care (Signed)

## 2019-10-11 LAB — COMPREHENSIVE METABOLIC PANEL
ALT: 162 U/L — ABNORMAL HIGH (ref 0–44)
AST: 100 U/L — ABNORMAL HIGH (ref 15–41)
Albumin: 3 g/dL — ABNORMAL LOW (ref 3.5–5.0)
Alkaline Phosphatase: 60 U/L (ref 38–126)
Anion gap: 17 — ABNORMAL HIGH (ref 5–15)
BUN: 106 mg/dL — ABNORMAL HIGH (ref 6–20)
CO2: 21 mmol/L — ABNORMAL LOW (ref 22–32)
Calcium: 9.1 mg/dL (ref 8.9–10.3)
Chloride: 94 mmol/L — ABNORMAL LOW (ref 98–111)
Creatinine, Ser: 14.03 mg/dL — ABNORMAL HIGH (ref 0.61–1.24)
GFR calc Af Amer: 5 mL/min — ABNORMAL LOW (ref >60)
GFR calc non Af Amer: 4 mL/min — ABNORMAL LOW (ref >60)
Glucose, Bld: 109 mg/dL — ABNORMAL HIGH (ref 70–99)
Potassium: 4.7 mmol/L (ref 3.5–5.1)
Sodium: 132 mmol/L — ABNORMAL LOW (ref 135–145)
Total Bilirubin: 1.5 mg/dL — ABNORMAL HIGH (ref 0.3–1.2)
Total Protein: 5.6 g/dL — ABNORMAL LOW (ref 6.5–8.1)

## 2019-10-11 LAB — CK: Total CK: 3309 U/L — ABNORMAL HIGH (ref 49–397)

## 2019-10-11 NOTE — Plan of Care (Signed)

## 2019-10-11 NOTE — Progress Notes (Addendum)
PROGRESS NOTE    Alex Potter  FAO:[id number] DOB: 1995-07-11 DOA: 10/03/2019 PCP: Patient, No Pcp Per    Brief Narrative:  Alex Potter is an 24 y.o. male with past medical history notable for hyperlipidemia, homeless who is followed [facility] comes into the ED for feeling bad. UDS was positive for cannabis with a white count of 25 platelet count of 255 and a CK of 15,000.  He admits to cocaine use with multiple beers and that he passed out. He does not know how much time he was out for maybe 6 to 12 hours   Assessment & Plan:   Principal Problem:   Rhabdomyolysis Active Problems:   Tobacco abuse   Polysubstance abuse (HCC)   ATN (acute tubular necrosis) (HCC)   Hyperkalemia   Liver dysfunction   Leukocytosis   Rhabdomyolysis, nontraumatic CK level greater than 50,000 on admission with MRI L-spine notable for muscular inflammation with likely necrosis right quadratus lumborum, Jason's back muscles insistent with pressure necrosis. Etiology likely secondary to acute intoxication with narcotics/beer and on known downtime. --CK >50K, 48K, 24K, 20K, 15.5K, 7.5K, 4.8K, 3.3K --Aggressive IV fluid hydration discontinued secondary to renal failure/volume overload --Continue supportive care --Monitor CK level daily  Renal failure secondary to ATN from rhabdomyolysis Patient presenting with creatinine of 2.42, likely secondary to rhabdomyolysis on commitment illicit drug use. Renal ultrasound with increased cortical echogenicity bilaterally consistent with medical renal disease and no hydronephrosis. --Nephrology following, appreciate assistance --Cr 2.42>>7.87>9.63>11.47>13.28>14.05>14.3 --UOP 640L past 24h --Possible need for HD if creatinine continues to rise per Nephrology --Monitor renal function daily  Hyperkalemia Potassium 5.8 on admission, likely secondary to renal failure. --Potassium 4.7 today --Continue renal/low potassium diet --Continue to monitor  electrolytes closely  Elevated LFTs Etiology likely secondary to shock liver versus rhabdomyolysis. --AST 1659>1753>>132>100 --ALT 758>827>180>162 --Monitor CMP daily  Polysubstance abuse Patient with history of EtOH, cocaine, heroin, THC abuse. UDS on admission positive for THC and opiates. Admitted snorting heroin. Discussed with him the need for complete cessation. Has been to rehab several times in the past per his father but typically relapses soon after discharge. --Social work following  Tobacco use disorder: --Nicotine patch  Right ankle/foot pain: Xray right ankle/foot with no acute findings.  PT with no further recommendations.  Right upper extremity edema Vascular duplex ultrasound right upper extremity negative for DVT or superficial venous thrombosis.  Etiology of swelling likely secondary to excessive volume secondary to poor renal function as above.    DVT prophylaxis: Heparin Code Status: Full code Family Communication: None present at bedside  Disposition Plan:  Status is: Inpatient  Remains inpatient appropriate because:Persistent severe electrolyte disturbances, Ongoing diagnostic testing needed not appropriate for outpatient work up, Unsafe d/c plan and IV treatments appropriate due to intensity of illness or inability to take PO   Dispo: The patient is from: Home              Anticipated d/c is to: Home              Anticipated d/c date is: 3 days              Patient currently is not medically stable to d/c.    Consultants:   Nephrology  Procedures:   None  Antimicrobials:   None   Subjective: Patient seen and examined bedside, sitting at side of bed.  Continues to complain of weakness, fatigue, and being "off balance". Report continues to make urine. No other complaints or concerns  at this time. Denies headache, no chest pain, no palpitations, no abdominal pain. No acute concerns overnight per nursing staff.  Objective: Vitals:    10/10/19 2051 10/11/19 0500 10/11/19 0600 10/11/19 0900  BP: (!) 147/85 138/78  138/89  Pulse: 92 88  75  Resp: 18 18  18   Temp: 98.8 F (37.1 C) 98.6 F (37 C)  97.9 F (36.6 C)  TempSrc: Oral Oral  Oral  SpO2: 98% 97%  100%  Weight:   77.7 kg   Height:        Intake/Output Summary (Last 24 hours) at 10/11/2019 1122 Last data filed at 10/10/2019 2051 Gross per 24 hour  Intake --  Output 400 ml  Net -400 ml   Filed Weights   10/10/19 0600 10/10/19 1153 10/11/19 0600  Weight: 78.5 kg 76.7 kg 77.7 kg    Examination:  General exam: Appears calm and comfortable  Respiratory system: Clear to auscultation. Respiratory effort normal. Cardiovascular system: S1 & S2 heard, RRR. No JVD, murmurs, rubs, gallops or clicks. No pedal edema. Gastrointestinal system: Abdomen is nondistended, soft and nontender. No organomegaly or masses felt. Normal bowel sounds heard. Central nervous system: Alert and oriented. No focal neurological deficits. Extremities: Symmetric 5 x 5 power. Skin: No rashes, lesions or ulcers Psychiatry: Judgement and insight appear poor. Mood & affect appropriate.     Data Reviewed: I have personally reviewed following labs and imaging studies  CBC: Recent Labs  Lab 10/06/19 0556 10/07/19 0417 10/08/19 0419 10/09/19 0620 10/10/19 0953  WBC 9.6 9.6 9.1 10.4 11.5*  HGB 12.2* 12.7* 12.3* 12.7* 13.0  HCT 38.4* 38.7* 37.1* 38.3* 38.4*  MCV 80.5 78.3* 77.8* 77.5* 77.1*  PLT 125* 149* 138* 122* 035*   Basic Metabolic Panel: Recent Labs  Lab 10/05/19 0006 10/05/19 0006 10/05/19 1225 10/05/19 1225 10/06/19 0556 10/06/19 0556 10/07/19 0417 10/08/19 0419 10/09/19 0620 10/10/19 0953 10/11/19 0332  NA 136   < > 134*   < > 135   < > 132* 132* 131* 133* 132*  K 5.0   < > 4.8   < > 4.3   < > 4.1 4.1 4.4 5.1 4.7  CL 103   < > 104   < > 103   < > 100 98 100 96* 94*  CO2 22   < > 19*   < > 21*   < > 20* 20* 22 22 21*  GLUCOSE 83   < > 84   < > 84   < > 85 90  93 99 109*  BUN 43*   < > 48*   < > 56*   < > 67* 77* 89* 105* 106*  CREATININE 5.25*   < > 6.08*   < > 7.87*   < > 9.63* 11.47* 13.28* 14.05* 14.03*  CALCIUM 8.0*   < > 8.0*   < > 8.1*   < > 8.1* 8.3* 8.5* 8.8* 9.1  PHOS 5.8*  --  5.9*  --  6.7*  --  7.0*  --   --   --   --    < > = values in this interval not displayed.   GFR: Estimated Creatinine Clearance: 7.9 mL/min (A) (by C-G formula based on SCr of 14.03 mg/dL (H)). Liver Function Tests: Recent Labs  Lab 10/07/19 0417 10/08/19 0419 10/09/19 0620 10/10/19 0953 10/11/19 0332  AST 457* 338* 194* 132* 100*  ALT 317* 268* 211* 180* 162*  ALKPHOS 87 74 64 61 60  BILITOT 1.4* 1.4* 2.3* 2.0* 1.5*  PROT 4.5* 4.8* 4.9* 5.2* 5.6*  ALBUMIN 2.3* 2.3* 2.4* 2.7* 3.0*   No results for input(s): LIPASE, AMYLASE in the last 168 hours. No results for input(s): AMMONIA in the last 168 hours. Coagulation Profile: No results for input(s): INR, PROTIME in the last 168 hours. Cardiac Enzymes: Recent Labs  Lab 10/07/19 0417 10/08/19 0419 10/09/19 0620 10/10/19 0953 10/11/19 0332  CKTOTAL 20,945* 15,533* 7,523* 4,806* 3,309*   BNP (last 3 results) No results for input(s): PROBNP in the last 8760 hours. HbA1C: No results for input(s): HGBA1C in the last 72 hours. CBG: No results for input(s): GLUCAP in the last 168 hours. Lipid Profile: No results for input(s): CHOL, HDL, LDLCALC, TRIG, CHOLHDL, LDLDIRECT in the last 72 hours. Thyroid Function Tests: No results for input(s): TSH, T4TOTAL, FREET4, T3FREE, THYROIDAB in the last 72 hours. Anemia Panel: No results for input(s): VITAMINB12, FOLATE, FERRITIN, TIBC, IRON, RETICCTPCT in the last 72 hours. Sepsis Labs: No results for input(s): PROCALCITON, LATICACIDVEN in the last 168 hours.  Recent Results (from the past 240 hour(s))  Culture, blood (routine x 2)     Status: None   Collection Time: 10/03/19  7:32 AM   Specimen: BLOOD  Result Value Ref Range Status   Specimen  Description   Final    BLOOD LEFT ANTECUBITAL Performed at [facility], [address] [place], [place] [address]    Special Requests   Final    BOTTLES DRAWN AEROBIC AND ANAEROBIC Blood Culture adequate volume Performed at [facility], [address], [place], [place] [address]    Culture   Final    NO GROWTH 5 DAYS Performed at [facility], [address], [place], [place] [address]    Report Status 10/08/2019 FINAL  Final  SARS Coronavirus 2 by RT PCR (hospital order, performed in [facility] hospital lab) Nasopharyngeal Nasopharyngeal Swab     Status: None   Collection Time: 10/03/19 12:53 PM   Specimen: Nasopharyngeal Swab  Result Value Ref Range Status   SARS Coronavirus 2 NEGATIVE NEGATIVE Final    Comment: (NOTE) SARS-CoV-2 target nucleic acids are NOT DETECTED. The SARS-CoV-2 RNA is generally detectable in upper and lower respiratory specimens during the acute phase of infection. The lowest concentration of SARS-CoV-2 viral copies this assay can detect is 250 copies / mL. A negative result does not preclude SARS-CoV-2 infection and should not be used as the sole basis for treatment or other patient management decisions.  A negative result may occur with improper specimen collection / handling, submission of specimen other than nasopharyngeal swab, presence of viral mutation(s) within the areas targeted by this assay, and inadequate number of viral copies (<250 copies / mL). A negative result must be combined with clinical observations, patient history, and epidemiological information. Fact Sheet for Patients:   BoilerBrush.com.cy Fact Sheet for Healthcare Providers: https://pope.com/ This test is not yet approved or cleared  by the [place] FDA and has been authorized for detection and/or diagnosis of SARS-CoV-2 by FDA under an Emergency Use Authorization (EUA).  This EUA  will remain in effect (meaning this test can be used) for the duration of the COVID-19 declaration under Section 564(b)(1) of the Act, 21 U.S.C. section 360bbb-3(b)(1), unless the authorization is terminated or revoked sooner. Performed at [facility], [address], [place], [place] [address]          Radiology Studies: No results found.  Scheduled Meds: . heparin  5,000 Units Subcutaneous Q8H  . nicotine  14 mg Transdermal Daily  . sodium chloride flush  3 mL Intravenous Q12H   Continuous Infusions:   LOS: 8 days    Time spent: 35 minutes spent on chart review, discussion with nursing staff, consultants, updating family and interview/physical exam; more than 50% of that time was spent in counseling and/or coordination of care.    Alvira Philips [place], DO [facility] Available via Epic secure chat 7am-7pm After these hours, please refer to coverage provider listed on [email or web address] 10/11/2019, 11:22 AM

## 2019-10-11 NOTE — Progress Notes (Signed)
[facility] Progress Note   Subjective:   Patient feels like he is urinating more more.  Much more awake and alert today.  However, actively vomiting and having a lot of nausea.  Objective Vitals:   10/11/19 0500 10/11/19 0600 10/11/19 0900 10/11/19 1338  BP: 138/78  138/89 (!) 145/86  Pulse: 88  75 82  Resp: 18  18 18   Temp: 98.6 F (37 C)  97.9 F (36.6 C) 98 F (36.7 C)  TempSrc: Oral  Oral   SpO2: 97%  100% 98%  Weight:  77.7 kg    Height:       Physical Exam General: Sitting in bed, vomiting Heart normal rate, no audible murmur Lungs: Bilateral chest rise, no increased work of breathing Abdomen: soft, nontender, mild distention Extremities: 1+ pitting edema in the bilateral lower extremities Neuro: Tired but awakes easily, alert and oriented  Additional Objective Labs: Basic Metabolic Panel: Recent Labs  Lab 10/05/19 1225 10/05/19 1225 10/06/19 0556 10/06/19 0556 10/07/19 0417 10/08/19 0419 10/09/19 0620 10/10/19 0953 10/11/19 0332  NA 134*   < > 135   < > 132*   < > 131* 133* 132*  K 4.8   < > 4.3   < > 4.1   < > 4.4 5.1 4.7  CL 104   < > 103   < > 100   < > 100 96* 94*  CO2 19*   < > 21*   < > 20*   < > 22 22 21*  GLUCOSE 84   < > 84   < > 85   < > 93 99 109*  BUN 48*   < > 56*   < > 67*   < > 89* 105* 106*  CREATININE 6.08*   < > 7.87*   < > 9.63*   < > 13.28* 14.05* 14.03*  CALCIUM 8.0*   < > 8.1*   < > 8.1*   < > 8.5* 8.8* 9.1  PHOS 5.9*  --  6.7*  --  7.0*  --   --   --   --    < > = values in this interval not displayed.   Liver Function Tests: Recent Labs  Lab 10/09/19 0620 10/10/19 0953 10/11/19 0332  AST 194* 132* 100*  ALT 211* 180* 162*  ALKPHOS 64 61 60  BILITOT 2.3* 2.0* 1.5*  PROT 4.9* 5.2* 5.6*  ALBUMIN 2.4* 2.7* 3.0*   No results for input(s): LIPASE, AMYLASE in the last 168 hours. CBC: Recent Labs  Lab 10/06/19 0556 10/06/19 0556 10/07/19 0417 10/07/19 0417 10/08/19 0419 10/09/19 0620 10/10/19 0953   WBC 9.6   < > 9.6   < > 9.1 10.4 11.5*  HGB 12.2*   < > 12.7*   < > 12.3* 12.7* 13.0  HCT 38.4*   < > 38.7*   < > 37.1* 38.3* 38.4*  MCV 80.5  --  78.3*  --  77.8* 77.5* 77.1*  PLT 125*   < > 149*   < > 138* 122* 142*   < > = values in this interval not displayed.   Blood Culture    Component Value Date/Time   SDES  10/03/2019 0732    BLOOD LEFT ANTECUBITAL Performed at [facility], [address] [place], [place]    SPECREQUEST  10/03/2019 0732    BOTTLES DRAWN AEROBIC AND ANAEROBIC Blood Culture adequate volume Performed at [facility], [address], [place], [place] [address]    CULT  10/03/2019 0732    NO GROWTH 5 DAYS Performed at [facility] [address] [place], [place] [address]    REPTSTATUS 10/08/2019 FINAL 10/03/2019 0732    Cardiac Enzymes: Recent Labs  Lab 10/07/19 0417 10/08/19 0419 10/09/19 0620 10/10/19 0953 10/11/19 0332  CKTOTAL 20,945* 15,533* 7,523* 4,806* 3,309*   CBG: No results for input(s): GLUCAP in the last 168 hours. Iron Studies: No results for input(s): IRON, TIBC, TRANSFERRIN, FERRITIN in the last 72 hours. @lablastinr3 @ Studies/Results: No results found. Medications:  . heparin  5,000 Units Subcutaneous Q8H  . nicotine  14 mg Transdermal Daily  . sodium chloride flush  3 mL Intravenous Q12H    Assessment/ Plan: 1. AKI, severe, oliguric- due to acute rhabdomyolysis related to intoxication/ drug use.  Creatinine and BUN stable today.  Patient feels that his urine output is improving.  Hopefully he is starting to turn the corner.  More alert today but possibly uremia with his nausea.  We will continue to monitor this closely.  Monitor creatinine tomorrow 1. Monitor for confusion, nausea, worsening fatigue 2. Consider dialysis based on labs, clinical presentation, and urine output, reevaluating daily 3. Avoid any nephrotoxic agents at this time 2. Volume overload: Pain  associated with edema improved today.  No complaints associated with his volume overload today.  No Lasix today 3. Emesis: Likely associated with elevated BUN.  Not an emergent indication for dialysis but will continue to monitor.  Would treat with as needed medications 4. Opioid abuse - has been to rehab several times per Dad, relapses soon after discharged.  Has been counselled  5. Acute rhabdomyolysis - traumatic in nature.  Improving 6. Hyperkalemia -secondary to AKI high cellular turnover from rhabdo.  Now improving likely with increased urine output.  Continue to monitor

## 2019-10-12 LAB — COMPREHENSIVE METABOLIC PANEL
ALT: 121 U/L — ABNORMAL HIGH (ref 0–44)
AST: 58 U/L — ABNORMAL HIGH (ref 15–41)
Albumin: 2.7 g/dL — ABNORMAL LOW (ref 3.5–5.0)
Alkaline Phosphatase: 52 U/L (ref 38–126)
Anion gap: 12 (ref 5–15)
BUN: 106 mg/dL — ABNORMAL HIGH (ref 6–20)
CO2: 22 mmol/L (ref 22–32)
Calcium: 8.4 mg/dL — ABNORMAL LOW (ref 8.9–10.3)
Chloride: 97 mmol/L — ABNORMAL LOW (ref 98–111)
Creatinine, Ser: 12.79 mg/dL — ABNORMAL HIGH (ref 0.61–1.24)
GFR calc Af Amer: 6 mL/min — ABNORMAL LOW (ref >60)
GFR calc non Af Amer: 5 mL/min — ABNORMAL LOW (ref >60)
Glucose, Bld: 101 mg/dL — ABNORMAL HIGH (ref 70–99)
Potassium: 4.1 mmol/L (ref 3.5–5.1)
Sodium: 131 mmol/L — ABNORMAL LOW (ref 135–145)
Total Bilirubin: 1.6 mg/dL — ABNORMAL HIGH (ref 0.3–1.2)
Total Protein: 5.1 g/dL — ABNORMAL LOW (ref 6.5–8.1)

## 2019-10-12 LAB — CK: Total CK: 1581 U/L — ABNORMAL HIGH (ref 49–397)

## 2019-10-12 MED ORDER — WHITE PETROLATUM EX OINT
TOPICAL_OINTMENT | CUTANEOUS | Status: AC
  Filled 2019-10-12: qty 28.35

## 2019-10-12 NOTE — Progress Notes (Signed)
PROGRESS NOTE    Alex Potter  EGB:[id number] DOB: [birth year] DOA: 10/03/2019 PCP: Patient, No Pcp Per    Brief Narrative:  Alex Potter is an 24 y.o. male with past medical history notable for hyperlipidemia, homeless who is followed [facility] comes into the ED for feeling bad. UDS was positive for cannabis with a white count of 25 platelet count of 255 and a CK of 15,000.  He admits to cocaine use with multiple beers and that he passed out. He does not know how much time he was out for maybe 6 to 12 hours   Assessment & Plan:   Principal Problem:   Rhabdomyolysis Active Problems:   Tobacco abuse   Polysubstance abuse (HCC)   ATN (acute tubular necrosis) (HCC)   Hyperkalemia   Liver dysfunction   Leukocytosis   Rhabdomyolysis, nontraumatic CK level greater than 50,000 on admission with MRI L-spine notable for muscular inflammation with likely necrosis right quadratus lumborum, Jason's back muscles insistent with pressure necrosis. Etiology likely secondary to acute intoxication with narcotics/beer and on known downtime. --CK >50K, 48K, 24K, 20K, 15.5K, 7.5K, 4.8K, 3.3K, 1.5K --Aggressive IV fluid hydration discontinued secondary to renal failure/volume overload --Continue supportive care --Monitor CK level daily  Renal failure secondary to ATN from rhabdomyolysis Patient presenting with creatinine of 2.42, likely secondary to rhabdomyolysis on commitment illicit drug use. Renal ultrasound with increased cortical echogenicity bilaterally consistent with medical renal disease and no hydronephrosis. --Nephrology following, appreciate assistance --Cr 2.42>>7.87>9.63>11.47>13.28>14.05>14.3>12.79 --UOP 1.9L past 24h --Creatinine now starting to improve with increased urine output, hopefully avoiding HD --Monitor renal function daily  Hyperkalemia Potassium 5.8 on admission, likely secondary to renal failure. --Potassium 4.1 today --Continue renal/low potassium  diet --Continue to monitor electrolytes closely  Elevated LFTs Etiology likely secondary to shock liver versus rhabdomyolysis. --AST 1659>1753>>132>100>58 --ALT 758>827>180>162>121 --Monitor CMP daily  Polysubstance abuse Patient with history of EtOH, cocaine, heroin, THC abuse. UDS on admission positive for THC and opiates. Admitted snorting heroin. Discussed with him the need for complete cessation. Has been to rehab several times in the past per his father but typically relapses soon after discharge. --Social work following  Tobacco use disorder: --Nicotine patch  Right ankle/foot pain: Xray right ankle/foot with no acute findings.  PT with no further recommendations.  Right upper extremity edema Vascular duplex ultrasound right upper extremity negative for DVT or superficial venous thrombosis.  Etiology of swelling likely secondary to excessive volume secondary to poor renal function as above.    DVT prophylaxis: Heparin Code Status: Full code Family Communication: None present at bedside  Disposition Plan:  Status is: Inpatient  Remains inpatient appropriate because:Persistent severe electrolyte disturbances, Ongoing diagnostic testing needed not appropriate for outpatient work up, Unsafe d/c plan and IV treatments appropriate due to intensity of illness or inability to take PO   Dispo: The patient is from: Home              Anticipated d/c is to: Home              Anticipated d/c date is: 3 days              Patient currently is not medically stable to d/c.    Consultants:   Nephrology  Procedures:   None  Antimicrobials:   None   Subjective: Patient seen and examined bedside, sitting at side of bed.  Reports increased urine output.  Continues to complain of some muscle aches/pains. No other complaints or concerns at  this time. Denies headache, no chest pain, no palpitations, no abdominal pain. No acute concerns overnight per nursing  staff.  Objective: Vitals:   10/11/19 2021 10/12/19 0548 10/12/19 0600 10/12/19 0727  BP: 133/82 (!) 135/95  (!) 153/92  Pulse: 82 90  87  Resp: 16 16  16   Temp: 98.6 F (37 C) 97.8 F (36.6 C)  97.9 F (36.6 C)  TempSrc: Oral Oral  Oral  SpO2: 100% 98%  96%  Weight:   77.9 kg   Height:        Intake/Output Summary (Last 24 hours) at 10/12/2019 1109 Last data filed at 10/12/2019 0600 Gross per 24 hour  Intake 720 ml  Output 1600 ml  Net -880 ml   Filed Weights   10/10/19 1153 10/11/19 0600 10/12/19 0600  Weight: 76.7 kg 77.7 kg 77.9 kg    Examination:  General exam: Appears calm and comfortable  Respiratory system: Clear to auscultation. Respiratory effort normal. Cardiovascular system: S1 & S2 heard, RRR. No JVD, murmurs, rubs, gallops or clicks. No pedal edema. Gastrointestinal system: Abdomen is nondistended, soft and nontender. No organomegaly or masses felt. Normal bowel sounds heard. Central nervous system: Alert and oriented. No focal neurological deficits. Extremities: Symmetric 5 x 5 power. Skin: No rashes, lesions or ulcers Psychiatry: Judgement and insight appear poor. Mood & affect appropriate.     Data Reviewed: I have personally reviewed following labs and imaging studies  CBC: Recent Labs  Lab 10/06/19 0556 10/07/19 0417 10/08/19 0419 10/09/19 0620 10/10/19 0953  WBC 9.6 9.6 9.1 10.4 11.5*  HGB 12.2* 12.7* 12.3* 12.7* 13.0  HCT 38.4* 38.7* 37.1* 38.3* 38.4*  MCV 80.5 78.3* 77.8* 77.5* 77.1*  PLT 125* 149* 138* 122* 142*   Basic Metabolic Panel: Recent Labs  Lab 10/05/19 1225 10/05/19 1225 10/06/19 0556 10/06/19 0556 10/07/19 0417 10/07/19 0417 10/08/19 0419 10/09/19 0620 10/10/19 0953 10/11/19 0332 10/12/19 0331  NA 134*   < > 135   < > 132*   < > 132* 131* 133* 132* 131*  K 4.8   < > 4.3   < > 4.1   < > 4.1 4.4 5.1 4.7 4.1  CL 104   < > 103   < > 100   < > 98 100 96* 94* 97*  CO2 19*   < > 21*   < > 20*   < > 20* 22 22 21* 22   GLUCOSE 84   < > 84   < > 85   < > 90 93 99 109* 101*  BUN 48*   < > 56*   < > 67*   < > 77* 89* 105* 106* 106*  CREATININE 6.08*   < > 7.87*   < > 9.63*   < > 11.47* 13.28* 14.05* 14.03* 12.79*  CALCIUM 8.0*   < > 8.1*   < > 8.1*   < > 8.3* 8.5* 8.8* 9.1 8.4*  PHOS 5.9*  --  6.7*  --  7.0*  --   --   --   --   --   --    < > = values in this interval not displayed.   GFR: Estimated Creatinine Clearance: 8.7 mL/min (A) (by C-G formula based on SCr of 12.79 mg/dL (H)). Liver Function Tests: Recent Labs  Lab 10/08/19 0419 10/09/19 0620 10/10/19 0953 10/11/19 0332 10/12/19 0331  AST 338* 194* 132* 100* 58*  ALT 268* 211* 180* 162* 121*  ALKPHOS 74 64  61 60 52  BILITOT 1.4* 2.3* 2.0* 1.5* 1.6*  PROT 4.8* 4.9* 5.2* 5.6* 5.1*  ALBUMIN 2.3* 2.4* 2.7* 3.0* 2.7*   No results for input(s): LIPASE, AMYLASE in the last 168 hours. No results for input(s): AMMONIA in the last 168 hours. Coagulation Profile: No results for input(s): INR, PROTIME in the last 168 hours. Cardiac Enzymes: Recent Labs  Lab 10/08/19 0419 10/09/19 0620 10/10/19 0953 10/11/19 0332 10/12/19 0331  CKTOTAL 15,533* 7,523* 4,806* 3,309* 1,581*   BNP (last 3 results) No results for input(s): PROBNP in the last 8760 hours. HbA1C: No results for input(s): HGBA1C in the last 72 hours. CBG: No results for input(s): GLUCAP in the last 168 hours. Lipid Profile: No results for input(s): CHOL, HDL, LDLCALC, TRIG, CHOLHDL, LDLDIRECT in the last 72 hours. Thyroid Function Tests: No results for input(s): TSH, T4TOTAL, FREET4, T3FREE, THYROIDAB in the last 72 hours. Anemia Panel: No results for input(s): VITAMINB12, FOLATE, FERRITIN, TIBC, IRON, RETICCTPCT in the last 72 hours. Sepsis Labs: No results for input(s): PROCALCITON, LATICACIDVEN in the last 168 hours.  Recent Results (from the past 240 hour(s))  Culture, blood (routine x 2)     Status: None   Collection Time: 10/03/19  7:32 AM   Specimen: BLOOD  Result  Value Ref Range Status   Specimen Description   Final    BLOOD LEFT ANTECUBITAL Performed at [facility] [address], [place], [place] [address]    Special Requests   Final    BOTTLES DRAWN AEROBIC AND ANAEROBIC Blood Culture adequate volume Performed at [facility] [address], [place], [place] [address]    Culture   Final    NO GROWTH 5 DAYS Performed at [facility], [place] [address], [place], [place] [address]    Report Status 10/08/2019 FINAL  Final  SARS Coronavirus 2 by RT PCR (hospital order, performed in [facility] hospital lab) Nasopharyngeal Nasopharyngeal Swab     Status: None   Collection Time: 10/03/19 12:53 PM   Specimen: Nasopharyngeal Swab  Result Value Ref Range Status   SARS Coronavirus 2 NEGATIVE NEGATIVE Final    Comment: (NOTE) SARS-CoV-2 target nucleic acids are NOT DETECTED. The SARS-CoV-2 RNA is generally detectable in upper and lower respiratory specimens during the acute phase of infection. The lowest concentration of SARS-CoV-2 viral copies this assay can detect is 250 copies / mL. A negative result does not preclude SARS-CoV-2 infection and should not be used as the sole basis for treatment or other patient management decisions.  A negative result may occur with improper specimen collection / handling, submission of specimen other than nasopharyngeal swab, presence of viral mutation(s) within the areas targeted by this assay, and inadequate number of viral copies (<250 copies / mL). A negative result must be combined with clinical observations, patient history, and epidemiological information. Fact Sheet for Patients:   StrictlyIdeas.no Fact Sheet for Healthcare Providers: BankingDealers.co.za This test is not yet approved or cleared  by the [place] FDA and has been authorized for detection and/or diagnosis of SARS-CoV-2 by FDA under an Emergency  Use Authorization (EUA).  This EUA will remain in effect (meaning this test can be used) for the duration of the COVID-19 declaration under Section 564(b)(1) of the Act, 21 U.S.C. section 360bbb-3(b)(1), unless the authorization is terminated or revoked sooner. Performed at [facility], [place] [address], [place], [place] [address]          Radiology  Studies: No results found.      Scheduled Meds: . heparin  5,000 Units Subcutaneous Q8H  . nicotine  14 mg Transdermal Daily  . sodium chloride flush  3 mL Intravenous Q12H   Continuous Infusions:   LOS: 9 days    Time spent: 35 minutes spent on chart review, discussion with nursing staff, consultants, updating family and interview/physical exam; more than 50% of that time was spent in counseling and/or coordination of care.    Alvira Philips [place], DO [facility] Available via Epic secure chat 7am-7pm After these hours, please refer to coverage provider listed on [email or web address] 10/12/2019, 11:09 AM

## 2019-10-12 NOTE — Plan of Care (Signed)
  Problem: Education: Goal: Knowledge of General Education information will improve Description: Including pain rating scale, medication(s)/side effects and non-pharmacologic comfort measures Outcome: Progressing   Problem: Health Behavior/Discharge Planning: Goal: Ability to manage health-related needs will improve Outcome: Progressing   Problem: Clinical Measurements: Goal: Ability to maintain clinical measurements within normal limits will improve Outcome: Progressing Goal: Will remain free from infection Outcome: Progressing   Problem: Pain Managment: Goal: General experience of comfort will improve Outcome: Progressing   Problem: Skin Integrity: Goal: Risk for impaired skin integrity will decrease Outcome: Progressing   

## 2019-10-12 NOTE — Progress Notes (Signed)
[facility] NEPHROLOGY PROGRESS NOTE  Assessment/ Plan: Pt is a 24 y.o. yo male polysubstance abuse, actively using presented with rhabdomyolysis causing severe AKI.  #Acute kidney injury, severe: Due to rhabdomyolysis related with drug/intoxication.  He received IV fluid.  Now off of fluid.  Urine output around 2 L in 24 hours and a creatinine level trending down.  Mental status seems gradually improving.  Plan to monitor his renal parameters at least for next 2 to 3 days. I have called and updated his father today.  The plan for him to go to addiction program in [place].  #Volume overload: Only trace edema.  Holding diuretics.  #Hyperkalemia: Improved now.  #Hyponatremia due to reduced free water excretion in the setting of AKI.  Expect to improve/auto diuresis with improvement in ATN.  #Nontraumatic rhabdomyolysis: CK level trending down.  No need to check CK every day.  #Polysubstance abuse including alcohol, cocaine, heroin, THC.  As per his father the plan is to go to addiction program at [place] after discharge from the hospital.  Subjective: Seen and examined at bedside.  He is sitting on chair comfortable.  Not in distress.  Denies nausea vomiting chest pain shortness of breath.  Reports generalized body minute.  He is requesting if we can call his father to give him update.  Objective Vital signs in last 24 hours: Vitals:   10/12/19 0548 10/12/19 0600 10/12/19 0727 10/12/19 1321  BP: (!) 135/95  (!) 153/92 (!) 139/93  Pulse: 90  87 83  Resp: 16  16 16   Temp: 97.8 F (36.6 C)  97.9 F (36.6 C) 97.6 F (36.4 C)  TempSrc: Oral  Oral Oral  SpO2: 98%  96% 99%  Weight:  77.9 kg    Height:       Weight change: 1.197 kg  Intake/Output Summary (Last 24 hours) at 10/12/2019 1436 Last data filed at 10/12/2019 1300 Gross per 24 hour  Intake 600 ml  Output 1650 ml  Net -1050 ml       Labs: Basic Metabolic Panel: Recent Labs  Lab 10/06/19 0556  10/06/19 0556 10/07/19 0417 10/08/19 0419 10/10/19 0953 10/11/19 0332 10/12/19 0331  NA 135   < > 132*   < > 133* 132* 131*  K 4.3   < > 4.1   < > 5.1 4.7 4.1  CL 103   < > 100   < > 96* 94* 97*  CO2 21*   < > 20*   < > 22 21* 22  GLUCOSE 84   < > 85   < > 99 109* 101*  BUN 56*   < > 67*   < > 105* 106* 106*  CREATININE 7.87*   < > 9.63*   < > 14.05* 14.03* 12.79*  CALCIUM 8.1*   < > 8.1*   < > 8.8* 9.1 8.4*  PHOS 6.7*  --  7.0*  --   --   --   --    < > = values in this interval not displayed.   Liver Function Tests: Recent Labs  Lab 10/10/19 0953 10/11/19 0332 10/12/19 0331  AST 132* 100* 58*  ALT 180* 162* 121*  ALKPHOS 61 60 52  BILITOT 2.0* 1.5* 1.6*  PROT 5.2* 5.6* 5.1*  ALBUMIN 2.7* 3.0* 2.7*   No results for input(s): LIPASE, AMYLASE in the last 168 hours. No results for input(s): AMMONIA in the last 168 hours. CBC: Recent Labs  Lab 10/06/19 0556  10/06/19 0556 10/07/19 0417 10/07/19 0417 10/08/19 0419 10/09/19 0620 10/10/19 0953  WBC 9.6   < > 9.6   < > 9.1 10.4 11.5*  HGB 12.2*   < > 12.7*   < > 12.3* 12.7* 13.0  HCT 38.4*   < > 38.7*   < > 37.1* 38.3* 38.4*  MCV 80.5  --  78.3*  --  77.8* 77.5* 77.1*  PLT 125*   < > 149*   < > 138* 122* 142*   < > = values in this interval not displayed.   Cardiac Enzymes: Recent Labs  Lab 10/08/19 0419 10/09/19 0620 10/10/19 0953 10/11/19 0332 10/12/19 0331  CKTOTAL 15,533* 7,523* 4,806* 3,309* 1,581*   CBG: No results for input(s): GLUCAP in the last 168 hours.  Iron Studies: No results for input(s): IRON, TIBC, TRANSFERRIN, FERRITIN in the last 72 hours. Studies/Results: No results found.  Medications: Infusions:   Scheduled Medications: . heparin  5,000 Units Subcutaneous Q8H  . nicotine  14 mg Transdermal Daily  . sodium chloride flush  3 mL Intravenous Q12H    have reviewed scheduled and prn medications.  Physical Exam: General:NAD, comfortable Heart:RRR, s1s2 nl Lungs:clear b/l, no  crackle Abdomen:soft, Non-tender, non-distended Extremities: Trace LE edema Neurology: Alert awake and following commands  Alex Potter Jaynie Collins 10/12/2019,2:36 PM  LOS: 9 days  Pager: [phone]

## 2019-10-12 NOTE — Progress Notes (Signed)
Physical Therapy Treatment Patient Details Name: Alex Potter MRN: [id number] DOB: 1995/09/01 Today's Date: 10/12/2019    History of Present Illness Alex Potter is an 24 y.o. male with past medical history notable for hyperlipidemia, homeless who was admitted due to rhabdomyolysis after passed out with use of cannabis and ETOH with prolonged down time.    PT Comments    Pt limited by reports of nausea and pain this session. Pt declining attempts at ambulation, does become agreeable to transfer out of bed and education on HEP. PT provides education on R ankle stretch into dorsiflexion with use of gait belt to provide relief of foot cramping as well as to maintain ankle ROM. PT also provides education on general LE exercise to aide in maintaining LE strength and endurance. Pt will benefit from continued acute PT POC to improve activity tolerance and to restore independence in mobility.   Follow Up Recommendations  Outpatient PT     Equipment Recommendations  Rolling walker with 5" wheels    Recommendations for Other Services       Precautions / Restrictions Precautions Precautions: Fall Precaution Comments: R foot drop and numbness Restrictions Weight Bearing Restrictions: No    Mobility  Bed Mobility Overal bed mobility: Modified Independent             General bed mobility comments: increased time and use of bed rail  Transfers Overall transfer level: Needs assistance Equipment used: None Transfers: Sit to/from Stand;Stand Pivot Transfers Sit to Stand: Supervision Stand pivot transfers: Min guard       General transfer comment: minG to step and turn to recliner due to muscle cramping and no UE support  Ambulation/Gait Ambulation/Gait assistance: (pt refuses ambulation 2/2 nausea and pain)               Stairs             Wheelchair Mobility    Modified Rankin (Stroke Patients Only)       Balance Overall balance assessment: Needs  assistance Sitting-balance support: No upper extremity supported;Feet supported Sitting balance-Leahy Scale: Good Sitting balance - Comments: supervision   Standing balance support: No upper extremity supported Standing balance-Leahy Scale: Fair Standing balance comment: close supervision for static standing without UE support                            Cognition Arousal/Alertness: Awake/alert Behavior During Therapy: WFL for tasks assessed/performed Overall Cognitive Status: Within Functional Limits for tasks assessed                                        Exercises General Exercises - Lower Extremity Ankle Circles/Pumps: AROM;Both;10 reps Gluteal Sets: AROM;Both Long Arc Quad: AROM;Both;5 reps Hip Flexion/Marching: AROM;Both;5 reps Other Exercises Other Exercises: R ankle DF stretch with use of gait belt, 1 rep 60 second hold    General Comments General comments (skin integrity, edema, etc.): VSS on RA      Pertinent Vitals/Pain Pain Assessment: Faces Faces Pain Scale: Hurts whole lot Pain Location: R foot and LE musculature Pain Descriptors / Indicators: Cramping Pain Intervention(s): Monitored during session    Home Living                      Prior Function            PT  Goals (current goals can now be found in the care plan section) Acute Rehab PT Goals Patient Stated Goal: return to independent Progress towards PT goals: Not progressing toward goals - comment(limited by pain and nausea today)    Frequency    Min 3X/week      PT Plan Current plan remains appropriate    Co-evaluation              AM-PAC PT "6 Clicks" Mobility   Outcome Measure  Help needed turning from your back to your side while in a flat bed without using bedrails?: None Help needed moving from lying on your back to sitting on the side of a flat bed without using bedrails?: None Help needed moving to and from a bed to a chair (including  a wheelchair)?: A Little Help needed standing up from a chair using your arms (e.g., wheelchair or bedside chair)?: None Help needed to walk in hospital room?: A Little Help needed climbing 3-5 steps with a railing? : A Little 6 Click Score: 21    End of Session Equipment Utilized During Treatment: Gait belt Activity Tolerance: Patient limited by pain;Treatment limited secondary to medical complications (Comment)(nausea) Patient left: in chair;with call bell/phone within reach;with chair alarm set Nurse Communication: Mobility status PT Visit Diagnosis: Other abnormalities of gait and mobility (R26.89);Muscle weakness (generalized) (M62.81)     Time: 6256-3893 PT Time Calculation (min) (ACUTE ONLY): 17 min  Charges:  $Therapeutic Exercise: 8-22 mins                     Zenaida Niece, PT, DPT Acute Rehabilitation Pager: [phone]    Zenaida Niece 10/12/2019, 2:14 PM

## 2019-10-13 LAB — COMPREHENSIVE METABOLIC PANEL
ALT: 93 U/L — ABNORMAL HIGH (ref 0–44)
AST: 40 U/L (ref 15–41)
Albumin: 2.9 g/dL — ABNORMAL LOW (ref 3.5–5.0)
Alkaline Phosphatase: 47 U/L (ref 38–126)
Anion gap: 13 (ref 5–15)
BUN: 99 mg/dL — ABNORMAL HIGH (ref 6–20)
CO2: 23 mmol/L (ref 22–32)
Calcium: 8.7 mg/dL — ABNORMAL LOW (ref 8.9–10.3)
Chloride: 97 mmol/L — ABNORMAL LOW (ref 98–111)
Creatinine, Ser: 11.52 mg/dL — ABNORMAL HIGH (ref 0.61–1.24)
GFR calc Af Amer: 6 mL/min — ABNORMAL LOW (ref >60)
GFR calc non Af Amer: 6 mL/min — ABNORMAL LOW (ref >60)
Glucose, Bld: 102 mg/dL — ABNORMAL HIGH (ref 70–99)
Potassium: 3.7 mmol/L (ref 3.5–5.1)
Sodium: 133 mmol/L — ABNORMAL LOW (ref 135–145)
Total Bilirubin: 1.7 mg/dL — ABNORMAL HIGH (ref 0.3–1.2)
Total Protein: 5.4 g/dL — ABNORMAL LOW (ref 6.5–8.1)

## 2019-10-13 NOTE — Progress Notes (Signed)
Physical Therapy Treatment Patient Details Name: Alex Potter MRN: [id number] DOB: 10-29-1995 Today's Date: 10/13/2019    History of Present Illness Alex Potter is a 24 y.o. male with past medical history notable for hyperlipidemia and polysubstance abuse, who was admitted due to rhabdomyolysis after passed out with use of cannabis and ETOH with prolonged down time.    PT Comments    Pt making steady progress overall with functional mobility. He demonstrated improved foot clearance and DF at R ankle during swing phase of gait as compared to previous sessions. Very focused on taking a shower and having a visitor soon. All VSS throughout and pt asymptomatic. Pt would continue to benefit from skilled physical therapy services at this time while admitted and after d/c to address the below listed limitations in order to improve overall safety and independence with functional mobility.    Follow Up Recommendations  Outpatient PT     Equipment Recommendations  Rolling walker with 5" wheels    Recommendations for Other Services       Precautions / Restrictions Precautions Precautions: Fall Precaution Comments: R foot drop and numbness Restrictions Weight Bearing Restrictions: No    Mobility  Bed Mobility Overal bed mobility: Modified Independent                Transfers Overall transfer level: Needs assistance Equipment used: Rolling walker (2 wheeled) Transfers: Sit to/from Stand Sit to Stand: Supervision         General transfer comment: supervision for safety  Ambulation/Gait Ambulation/Gait assistance: Supervision Gait Distance (Feet): 100 Feet Assistive device: Rolling walker (2 wheeled) Gait Pattern/deviations: Step-to pattern;Step-through pattern;Decreased stride length;Decreased dorsiflexion - right;Decreased step length - left Gait velocity: decreased   General Gait Details: Use of left shoe to provide increased height for right foot to clear during swing  phase. Cues for sequence, pt preferring to pick up walker. no gross imbalance   Social research officer, government Rank[employer] (Stroke Patients Only)       Balance Overall balance assessment: Needs assistance Sitting-balance support: No upper extremity supported;Feet supported Sitting balance-Leahy Scale: Good Sitting balance - Comments: pt able to don sock on R LE seated EOB without LOB or need for assistance   Standing balance support: No upper extremity supported Standing balance-Leahy Scale: Fair                              Cognition Arousal/Alertness: Awake/alert Behavior During Therapy: WFL for tasks assessed/performed Overall Cognitive Status: Within Functional Limits for tasks assessed                                        Exercises      General Comments        Pertinent Vitals/Pain Pain Assessment: Faces Faces Pain Scale: Hurts little more Pain Location: R foot and LE musculature Pain Descriptors / Indicators: Tingling Pain Intervention(s): Monitored during session    Home Living                      Prior Function            PT Goals (current goals can now be found in the care plan section) Acute Rehab PT Goals PT Goal Formulation: With patient Time For  Goal Achievement: 10/21/19 Potential to Achieve Goals: Good Progress towards PT goals: Progressing toward goals    Frequency    Min 3X/week      PT Plan Current plan remains appropriate    Co-evaluation              AM-PAC PT "6 Clicks" Mobility   Outcome Measure  Help needed turning from your back to your side while in a flat bed without using bedrails?: None Help needed moving from lying on your back to sitting on the side of a flat bed without using bedrails?: None Help needed moving to and from a bed to a chair (including a wheelchair)?: None Help needed standing up from a chair using your arms (e.g., wheelchair or  bedside chair)?: None Help needed to walk in hospital room?: None Help needed climbing 3-5 steps with a railing? : A Little 6 Click Score: 23    End of Session   Activity Tolerance: Patient tolerated treatment well Patient left: in bed;with call bell/phone within reach Nurse Communication: Mobility status PT Visit Diagnosis: Other abnormalities of gait and mobility (R26.89);Muscle weakness (generalized) (M62.81)     Time: 1440-1511 PT Time Calculation (min) (ACUTE ONLY): 31 min  Charges:  $Gait Training: 8-22 mins $Therapeutic Activity: 8-22 mins                     Anastasio Champion, DPT  Acute Rehabilitation Services Pager [phone] Office [place] 10/13/2019, 4:04 PM

## 2019-10-13 NOTE — Progress Notes (Signed)
[facility] NEPHROLOGY PROGRESS NOTE  Assessment/ Plan: Pt is a 24 y.o. yo male polysubstance abuse, actively using presented with rhabdomyolysis causing severe AKI.  #Acute kidney injury, severe: Due to rhabdomyolysis related with drug/intoxication.  He received IV fluid.  Now off of fluid.   Good urine output and creatinine level trending down to 11.5 today.  Plan to continue to monitor for next 1 to 2 days before discharge.  He is mental status is improving gradually. I have called and updated his father on 5/31.  The plan for him to go to addiction program in [place].  #Volume overload: Only trace edema.  Holding diuretics as he will have auto diuresis.  #Hyperkalemia: Improved now.  #Hyponatremia due to reduced free water excretion in the setting of AKI.  Expect to improve/auto diuresis with improvement in ATN.  #Nontraumatic rhabdomyolysis: CK level trending down.  No need to check CK every day.  #Polysubstance abuse including alcohol, cocaine, heroin, THC.  As per his father the plan is to go to addiction program at [place] after discharge from the hospital.  Subjective: Seen and examined at bedside.  No new event.  Sitting on chair comfortable.  Mental status improved.  Denies headache, dizziness, nausea vomiting chest pain shortness of breath..  Objective Vital signs in last 24 hours: Vitals:   10/12/19 1321 10/12/19 1919 10/13/19 0500 10/13/19 0736  BP: (!) 139/93 (!) 141/78 (!) 135/95 137/88  Pulse: 83 89 84 83  Resp: 16 17 16 16   Temp: 97.6 F (36.4 C) 97.7 F (36.5 C) 98 F (36.7 C) 97.7 F (36.5 C)  TempSrc: Oral Oral Oral Oral  SpO2: 99% 97% 94% 97%  Weight:      Height:       Weight change:   Intake/Output Summary (Last 24 hours) at 10/13/2019 1156 Last data filed at 10/13/2019 0900 Gross per 24 hour  Intake 600 ml  Output 2350 ml  Net -1750 ml       Labs: Basic Metabolic Panel: Recent Labs  Lab 10/07/19 0417 10/08/19 0419  10/11/19 0332 10/12/19 0331 10/13/19 0449  NA 132*   < > 132* 131* 133*  K 4.1   < > 4.7 4.1 3.7  CL 100   < > 94* 97* 97*  CO2 20*   < > 21* 22 23  GLUCOSE 85   < > 109* 101* 102*  BUN 67*   < > 106* 106* 99*  CREATININE 9.63*   < > 14.03* 12.79* 11.52*  CALCIUM 8.1*   < > 9.1 8.4* 8.7*  PHOS 7.0*  --   --   --   --    < > = values in this interval not displayed.   Liver Function Tests: Recent Labs  Lab 10/11/19 0332 10/12/19 0331 10/13/19 0449  AST 100* 58* 40  ALT 162* 121* 93*  ALKPHOS 60 52 47  BILITOT 1.5* 1.6* 1.7*  PROT 5.6* 5.1* 5.4*  ALBUMIN 3.0* 2.7* 2.9*   No results for input(s): LIPASE, AMYLASE in the last 168 hours. No results for input(s): AMMONIA in the last 168 hours. CBC: Recent Labs  Lab 10/07/19 0417 10/07/19 0417 10/08/19 0419 10/09/19 0620 10/10/19 0953  WBC 9.6   < > 9.1 10.4 11.5*  HGB 12.7*   < > 12.3* 12.7* 13.0  HCT 38.7*   < > 37.1* 38.3* 38.4*  MCV 78.3*  --  77.8* 77.5* 77.1*  PLT 149*   < > 138* 122*  142*   < > = values in this interval not displayed.   Cardiac Enzymes: Recent Labs  Lab 10/08/19 0419 10/09/19 0620 10/10/19 0953 10/11/19 0332 10/12/19 0331  CKTOTAL 15,533* 7,523* 4,806* 3,309* 1,581*   CBG: No results for input(s): GLUCAP in the last 168 hours.  Iron Studies: No results for input(s): IRON, TIBC, TRANSFERRIN, FERRITIN in the last 72 hours. Studies/Results: No results found.  Medications: Infusions:   Scheduled Medications: . heparin  5,000 Units Subcutaneous Q8H  . nicotine  14 mg Transdermal Daily  . sodium chloride flush  3 mL Intravenous Q12H    have reviewed scheduled and prn medications.  Physical Exam: General:NAD, comfortable Heart:RRR, s1s2 nl Lungs:clear b/l, no crackle Abdomen:soft, Non-tender, non-distended Extremities: Trace LE edema Neurology: Alert awake and following commands  Alyha Marines Tanna Furry 10/13/2019,11:56 AM  LOS: 10 days  Pager: [phone]

## 2019-10-13 NOTE — Progress Notes (Signed)
PROGRESS NOTE    Alex Potter  ZOX:[id number] DOB: 01-28-1996 DOA: 10/03/2019 PCP: Patient, No Pcp Per    Brief Narrative:  Alex Potter is an 24 y.o. male with past medical history notable for hyperlipidemia, homeless who is followed [facility] comes into the ED for feeling bad. UDS was positive for cannabis with a white count of 25 platelet count of 255 and a CK of 15,000.  He admits to cocaine use with multiple beers and that he passed out. He does not know how much time he was out for maybe 6 to 12 hours   Assessment & Plan:   Principal Problem:   Rhabdomyolysis Active Problems:   Tobacco abuse   Polysubstance abuse (HCC)   ATN (acute tubular necrosis) (HCC)   Hyperkalemia   Liver dysfunction   Leukocytosis   Rhabdomyolysis, nontraumatic CK level greater than 50,000 on admission with MRI L-spine notable for muscular inflammation with likely necrosis right quadratus lumborum, Jason's back muscles insistent with pressure necrosis. Etiology likely secondary to acute intoxication with narcotics/beer and on known downtime. --CK >50K, 48K, 24K, 20K, 15.5K, 7.5K, 4.8K, 3.3K, 1.5K --Aggressive IV fluid hydration discontinued secondary to renal failure/volume overload --Continue supportive care --Monitor CK level daily  Renal failure secondary to ATN from rhabdomyolysis Patient presenting with creatinine of 2.42, likely secondary to rhabdomyolysis on commitment illicit drug use. Renal ultrasound with increased cortical echogenicity bilaterally consistent with medical renal disease and no hydronephrosis. --Nephrology following, appreciate assistance --Cr 2.42>>7.87>9.63>11.47>13.28>14.05>14.3>12.79>11.52 --UOP 2.0L past 24h --Creatinine now starting to improve with increased urine output, hopefully avoiding HD --Monitor renal function daily --Nephrology would like to see a consistent decline in creatinine for at least 2-3 days before patient stable for  discharge  Hyperkalemia Potassium 5.8 on admission, likely secondary to renal failure. --Potassium 3.7 today --Continue renal/low potassium diet --Continue to monitor electrolytes closely  Elevated LFTs Etiology likely secondary to shock liver versus rhabdomyolysis. --AST 1659>1753>>132>100>58>40 --ALT 758>827>180>162>121>93 --Monitor CMP daily  Polysubstance abuse Patient with history of EtOH, cocaine, heroin, THC abuse. UDS on admission positive for THC and opiates. Admitted snorting heroin. Discussed with him the need for complete cessation. Has been to rehab several times in the past per his father but typically relapses soon after discharge. --Social work following --Higher education careers adviser addiction program in [place] on discharge  Tobacco use disorder: --Nicotine patch  Right ankle/foot pain: Xray right ankle/foot with no acute findings.  PT with no further recommendations.  Right upper extremity edema Vascular duplex ultrasound right upper extremity negative for DVT or superficial venous thrombosis.  Etiology of swelling likely secondary to excessive volume secondary to poor renal function as above.    DVT prophylaxis: Heparin Code Status: Full code Family Communication: None present at bedside  Disposition Plan:  Status is: Inpatient  Remains inpatient appropriate because:Persistent severe electrolyte disturbances, Ongoing diagnostic testing needed not appropriate for outpatient work up, Unsafe d/c plan and IV treatments appropriate due to intensity of illness or inability to take PO   Dispo: The patient is from: Home              Anticipated d/c is to: Home              Anticipated d/c date is: 3 days              Patient currently is not medically stable to d/c.    Consultants:   Nephrology  Procedures:   None  Antimicrobials:   None   Subjective: Patient  seen and examined bedside, sleeping but easily arousable.  Continues to report good urine output.   Continues to complain of some muscle aches/pains. No other complaints or concerns at this time. Denies headache, no chest pain, no palpitations, no abdominal pain. No acute concerns overnight per nursing staff.  Objective: Vitals:   10/12/19 1321 10/12/19 1919 10/13/19 0500 10/13/19 0736  BP: (!) 139/93 (!) 141/78 (!) 135/95 137/88  Pulse: 83 89 84 83  Resp: 16 17 16 16   Temp: 97.6 F (36.4 C) 97.7 F (36.5 C) 98 F (36.7 C) 97.7 F (36.5 C)  TempSrc: Oral Oral Oral Oral  SpO2: 99% 97% 94% 97%  Weight:      Height:        Intake/Output Summary (Last 24 hours) at 10/13/2019 1111 Last data filed at 10/13/2019 0900 Gross per 24 hour  Intake 600 ml  Output 2350 ml  Net -1750 ml   Filed Weights   10/10/19 1153 10/11/19 0600 10/12/19 0600  Weight: 76.7 kg 77.7 kg 77.9 kg    Examination:  General exam: Appears calm and comfortable  Respiratory system: Clear to auscultation. Respiratory effort normal. Cardiovascular system: S1 & S2 heard, RRR. No JVD, murmurs, rubs, gallops or clicks. No pedal edema. Gastrointestinal system: Abdomen is nondistended, soft and nontender. No organomegaly or masses felt. Normal bowel sounds heard. Central nervous system: Alert and oriented. No focal neurological deficits. Extremities: Symmetric 5 x 5 power. Skin: No rashes, lesions or ulcers Psychiatry: Judgement and insight appear poor.  Depressed mood & flat affect.     Data Reviewed: I have personally reviewed following labs and imaging studies  CBC: Recent Labs  Lab 10/07/19 0417 10/08/19 0419 10/09/19 0620 10/10/19 0953  WBC 9.6 9.1 10.4 11.5*  HGB 12.7* 12.3* 12.7* 13.0  HCT 38.7* 37.1* 38.3* 38.4*  MCV 78.3* 77.8* 77.5* 77.1*  PLT 149* 138* 122* 142*   Basic Metabolic Panel: Recent Labs  Lab 10/07/19 0417 10/08/19 0419 10/09/19 0620 10/10/19 0953 10/11/19 0332 10/12/19 0331 10/13/19 0449  NA 132*   < > 131* 133* 132* 131* 133*  K 4.1   < > 4.4 5.1 4.7 4.1 3.7  CL 100   <  > 100 96* 94* 97* 97*  CO2 20*   < > 22 22 21* 22 23  GLUCOSE 85   < > 93 99 109* 101* 102*  BUN 67*   < > 89* 105* 106* 106* 99*  CREATININE 9.63*   < > 13.28* 14.05* 14.03* 12.79* 11.52*  CALCIUM 8.1*   < > 8.5* 8.8* 9.1 8.4* 8.7*  PHOS 7.0*  --   --   --   --   --   --    < > = values in this interval not displayed.   GFR: Estimated Creatinine Clearance: 9.6 mL/min (A) (by C-G formula based on SCr of 11.52 mg/dL (H)). Liver Function Tests: Recent Labs  Lab 10/09/19 0620 10/10/19 0953 10/11/19 0332 10/12/19 0331 10/13/19 0449  AST 194* 132* 100* 58* 40  ALT 211* 180* 162* 121* 93*  ALKPHOS 64 61 60 52 47  BILITOT 2.3* 2.0* 1.5* 1.6* 1.7*  PROT 4.9* 5.2* 5.6* 5.1* 5.4*  ALBUMIN 2.4* 2.7* 3.0* 2.7* 2.9*   No results for input(s): LIPASE, AMYLASE in the last 168 hours. No results for input(s): AMMONIA in the last 168 hours. Coagulation Profile: No results for input(s): INR, PROTIME in the last 168 hours. Cardiac Enzymes: Recent Labs  Lab 10/08/19 0419 10/09/19  5732 10/10/19 0953 10/11/19 0332 10/12/19 0331  CKTOTAL 15,533* 7,523* 4,806* 3,309* 1,581*   BNP (last 3 results) No results for input(s): PROBNP in the last 8760 hours. HbA1C: No results for input(s): HGBA1C in the last 72 hours. CBG: No results for input(s): GLUCAP in the last 168 hours. Lipid Profile: No results for input(s): CHOL, HDL, LDLCALC, TRIG, CHOLHDL, LDLDIRECT in the last 72 hours. Thyroid Function Tests: No results for input(s): TSH, T4TOTAL, FREET4, T3FREE, THYROIDAB in the last 72 hours. Anemia Panel: No results for input(s): VITAMINB12, FOLATE, FERRITIN, TIBC, IRON, RETICCTPCT in the last 72 hours. Sepsis Labs: No results for input(s): PROCALCITON, LATICACIDVEN in the last 168 hours.  Recent Results (from the past 240 hour(s))  SARS Coronavirus 2 by RT PCR (hospital order, performed in [facility] hospital lab) Nasopharyngeal Nasopharyngeal Swab     Status: None   Collection Time:  10/03/19 12:53 PM   Specimen: Nasopharyngeal Swab  Result Value Ref Range Status   SARS Coronavirus 2 NEGATIVE NEGATIVE Final    Comment: (NOTE) SARS-CoV-2 target nucleic acids are NOT DETECTED. The SARS-CoV-2 RNA is generally detectable in upper and lower respiratory specimens during the acute phase of infection. The lowest concentration of SARS-CoV-2 viral copies this assay can detect is 250 copies / mL. A negative result does not preclude SARS-CoV-2 infection and should not be used as the sole basis for treatment or other patient management decisions.  A negative result may occur with improper specimen collection / handling, submission of specimen other than nasopharyngeal swab, presence of viral mutation(s) within the areas targeted by this assay, and inadequate number of viral copies (<250 copies / mL). A negative result must be combined with clinical observations, patient history, and epidemiological information. Fact Sheet for Patients:   StrictlyIdeas.no Fact Sheet for Healthcare Providers: BankingDealers.co.za This test is not yet approved or cleared  by the [place] FDA and has been authorized for detection and/or diagnosis of SARS-CoV-2 by FDA under an Emergency Use Authorization (EUA).  This EUA will remain in effect (meaning this test can be used) for the duration of the COVID-19 declaration under Section 564(b)(1) of the Act, 21 U.S.C. section 360bbb-3(b)(1), unless the authorization is terminated or revoked sooner. Performed at [facility], [place] [address] [place],  [address]          Radiology Studies: No results found.      Scheduled Meds: . heparin  5,000 Units Subcutaneous Q8H  . nicotine  14 mg Transdermal Daily  . sodium chloride flush  3 mL Intravenous Q12H   Continuous Infusions:   LOS: 10 days    Time spent: 35 minutes spent on chart review, discussion with  nursing staff, consultants, updating family and interview/physical exam; more than 50% of that time was spent in counseling and/or coordination of care.    Uri Covey J [place] DO [facility] Available via Epic secure chat 7am-7pm After these hours, please refer to coverage provider listed on [email or web address] 10/13/2019, 11:11 AM

## 2019-10-14 LAB — COMPREHENSIVE METABOLIC PANEL
ALT: 79 U/L — ABNORMAL HIGH (ref 0–44)
AST: 35 U/L (ref 15–41)
Albumin: 3.1 g/dL — ABNORMAL LOW (ref 3.5–5.0)
Alkaline Phosphatase: 55 U/L (ref 38–126)
Anion gap: 13 (ref 5–15)
BUN: 90 mg/dL — ABNORMAL HIGH (ref 6–20)
CO2: 23 mmol/L (ref 22–32)
Calcium: 8.7 mg/dL — ABNORMAL LOW (ref 8.9–10.3)
Chloride: 98 mmol/L (ref 98–111)
Creatinine, Ser: 9.15 mg/dL — ABNORMAL HIGH (ref 0.61–1.24)
GFR calc Af Amer: 8 mL/min — ABNORMAL LOW (ref >60)
GFR calc non Af Amer: 7 mL/min — ABNORMAL LOW (ref >60)
Glucose, Bld: 107 mg/dL — ABNORMAL HIGH (ref 70–99)
Potassium: 3.8 mmol/L (ref 3.5–5.1)
Sodium: 134 mmol/L — ABNORMAL LOW (ref 135–145)
Total Bilirubin: 1.6 mg/dL — ABNORMAL HIGH (ref 0.3–1.2)
Total Protein: 5.4 g/dL — ABNORMAL LOW (ref 6.5–8.1)

## 2019-10-14 NOTE — Progress Notes (Signed)
   10/14/19 1412  Assess: MEWS Score  Temp 98.1 F (36.7 C)  BP 130/72  Pulse Rate 81  Resp 16  Level of Consciousness Alert  SpO2 98 %  O2 Device Room Air  Assess: if the MEWS score is Yellow or Red  Were vital signs taken at a resting state? Yes  Focused Assessment Documented focused assessment  Early Detection of Sepsis Score *See Row Information* Low  MEWS guidelines implemented *See Row Information* No, vital signs rechecked  Notify: Charge Nurse/RN  Name of Charge Nurse/RN Notified Darl Pikes  Date Charge Nurse/RN Notified 10/14/19  Time Charge Nurse/RN Notified 1415  Document  Progress note created (see row info) Yes     Vitals rechecked after CCMD pulse rate triggered yellow mews, CCMD was notified that patient was up putting on shoes and ambulating at time of elevated HR.   Patient NAD, all VS were WNL.

## 2019-10-14 NOTE — Plan of Care (Signed)

## 2019-10-14 NOTE — Progress Notes (Signed)
PROGRESS NOTE    Alex Potter  YSA:[id number] DOB: 24-Aug-1995 DOA: 10/03/2019 PCP: Patient, No Pcp Per   Chef Complaints: passed out  Brief Narrative: 24 y.o.malewith past medical history notable for hyperlipidemia, homeless who is followed [facility] comes into the ED for feeling bad. UDS was positive for cannabis with a white count of 25 platelet count of 255 and a CK of 15,000.  He admits to cocaine use with multiple beers and that he passed out. He does not know how much time he was out for maybe 6 to 12 hours  Subjective: Alert,awake, peeing well, eating, no nausea or vomitting  bun/cr- improving at 90/9.1 No new complaints   Assessment & Plan: Nontraumatic rhabdomyolysis: Above 50,000 CK on admission.  MRI L-spine with muscular inflammation with likely necrosis right quadratus lumbar M.  Likely acute intoxication with narcotics unknown downtime.  CK is nicely improved off IV fluids tolerating p.o.  ATN due to rhabdomyolysis/drug intoxication.  Creatinine peaked to 14.0 now downtrending off IV fluids, followed by nephrology, encourage oral hydration if creatinine continues to get better by tomorrow okay for discharge per nephrology.  Reassuring urine output. Recent Labs  Lab 10/10/19 0953 10/11/19 0332 10/12/19 0331 10/13/19 0449 10/14/19 0342  BUN 105* 106* 106* 99* 90*  CREATININE 14.05* 14.03* 12.79* 11.52* 9.15*   Polysubstance abuse/EtOH, cocaine, heroin, THC.  UDS positive for THC and opiates on admission.  Admitted to snorting heroin.  He has been extensively counseled for need to completely quit and cessation.  He has been to rehab several times in the past per his father but typically he relapses soon after discharge social worker was consulted and father is planning to take him back to [place] for drug rehab  Hyperkalemia due to AKI.  Resolved.  Hyponatremia due to reduced free water deficit.  Auto diuresing improving.  Transaminitis/Liver  dysfunction-IMPROVING, TB STILL HIGH, MONITOR  Leukocytosis: likely reactive.  Resolved. Recent Labs  Lab 10/08/19 0419 10/09/19 0620 10/10/19 0953  WBC 9.1 10.4 11.5*   Volume overload edema improved, holding diuretics as he is auto diuresing.  Right upper extremity edema duplex negative for DVT likely from IV fluids.  Right ankle foot pain x-ray was unremarkable, pain control.  DVT prophylaxis:Heparin Code Status: full Family Communication: plan of care discussed with patient at bedside.  Status is: Inpatient  Remains inpatient appropriate because:Inpatient level of care appropriate due to severity of illness and For ongoing monitoring of kidney function   Dispo: The patient is from: Home              Anticipated d/c is to: Home              Anticipated d/c date is: 1 day              Patient currently is not medically stable to d/c.  Diet Order            Diet renal with fluid restriction Fluid restriction: 1200 mL Fluid; Room service appropriate? Yes; Fluid consistency: Thin  Diet effective now                    Body mass index is 28.58 kg/m.  Consultants:see note  Procedures:see note Microbiology:see note  Medications: Scheduled Meds: . heparin  5,000 Units Subcutaneous Q8H  . nicotine  14 mg Transdermal Daily  . sodium chloride flush  3 mL Intravenous Q12H   Continuous Infusions:  Antimicrobials: Anti-infectives (From admission, onward)  None       Objective: Vitals: Today's Vitals   10/13/19 1922 10/14/19 0200 10/14/19 0350 10/14/19 0812  BP: (!) 145/76  132/81 138/77  Pulse: 83  83 78  Resp: 16  14 17   Temp: 98.3 F (36.8 C)  97.6 F (36.4 C) 98.2 F (36.8 C)  TempSrc: Oral  Oral Oral  SpO2: 100%  98% 96%  Weight:      Height:      PainSc:  Asleep      Intake/Output Summary (Last 24 hours) at 10/14/2019 0929 Last data filed at 10/14/2019 0400 Gross per 24 hour  Intake 480 ml  Output 2000 ml  Net -1520 ml   Filed Weights    10/10/19 1153 10/11/19 0600 10/12/19 0600  Weight: 76.7 kg 77.7 kg 77.9 kg   Weight change:    Intake/Output from previous day: 06/01 0701 - 06/02 0700 In: 720 [P.O.:720] Out: 2300 [Urine:2300] Intake/Output this shift: No intake/output data recorded.  Examination:  General exam: AAOx3, on RA, NAD, weak appearing. HEENT:Oral mucosa moist, Ear/Nose WNL grossly,dentition normal. Respiratory system: bilaterally clear,no wheezing or crackles,no use of accessory muscle, non tender. Cardiovascular system: S1 & S2 +, regular, No JVD. Gastrointestinal system: Abdomen soft, NT,ND, BS+. Nervous System:Alert, awake, moving extremities and grossly nonfocal Extremities: No edema, distal peripheral pulses palpable.  Skin: No rashes,no icterus. MSK: Normal muscle bulk,tone, power  Data Reviewed: I have personally reviewed following labs and imaging studies CBC: Recent Labs  Lab 10/08/19 0419 10/09/19 0620 10/10/19 0953  WBC 9.1 10.4 11.5*  HGB 12.3* 12.7* 13.0  HCT 37.1* 38.3* 38.4*  MCV 77.8* 77.5* 77.1*  PLT 138* 122* 106*   Basic Metabolic Panel: Recent Labs  Lab 10/10/19 0953 10/11/19 0332 10/12/19 0331 10/13/19 0449 10/14/19 0342  NA 133* 132* 131* 133* 134*  K 5.1 4.7 4.1 3.7 3.8  CL 96* 94* 97* 97* 98  CO2 22 21* 22 23 23   GLUCOSE 99 109* 101* 102* 107*  BUN 105* 106* 106* 99* 90*  CREATININE 14.05* 14.03* 12.79* 11.52* 9.15*  CALCIUM 8.8* 9.1 8.4* 8.7* 8.7*   GFR: Estimated Creatinine Clearance: 12.1 mL/min (A) (by C-G formula based on SCr of 9.15 mg/dL (H)). Liver Function Tests: Recent Labs  Lab 10/10/19 0953 10/11/19 0332 10/12/19 0331 10/13/19 0449 10/14/19 0342  AST 132* 100* 58* 40 35  ALT 180* 162* 121* 93* 79*  ALKPHOS 61 60 52 47 55  BILITOT 2.0* 1.5* 1.6* 1.7* 1.6*  PROT 5.2* 5.6* 5.1* 5.4* 5.4*  ALBUMIN 2.7* 3.0* 2.7* 2.9* 3.1*   No results for input(s): LIPASE, AMYLASE in the last 168 hours. No results for input(s): AMMONIA in the last 168  hours. Coagulation Profile: No results for input(s): INR, PROTIME in the last 168 hours. Cardiac Enzymes: Recent Labs  Lab 10/08/19 0419 10/09/19 0620 10/10/19 0953 10/11/19 0332 10/12/19 0331  CKTOTAL 15,533* 7,523* 4,806* 3,309* 1,581*   BNP (last 3 results) No results for input(s): PROBNP in the last 8760 hours. HbA1C: No results for input(s): HGBA1C in the last 72 hours. CBG: No results for input(s): GLUCAP in the last 168 hours. Lipid Profile: No results for input(s): CHOL, HDL, LDLCALC, TRIG, CHOLHDL, LDLDIRECT in the last 72 hours. Thyroid Function Tests: No results for input(s): TSH, T4TOTAL, FREET4, T3FREE, THYROIDAB in the last 72 hours. Anemia Panel: No results for input(s): VITAMINB12, FOLATE, FERRITIN, TIBC, IRON, RETICCTPCT in the last 72 hours. Sepsis Labs: No results for input(s): PROCALCITON, LATICACIDVEN in the last  168 hours.  No results found for this or any previous visit (from the past 240 hour(s)).    Radiology Studies: No results found.   LOS: 11 days   Lanae Boast, MD [facility]  10/14/2019, 9:29 AM

## 2019-10-14 NOTE — Plan of Care (Signed)

## 2019-10-14 NOTE — Progress Notes (Signed)
[facility] NEPHROLOGY PROGRESS NOTE  Assessment/ Plan: Pt is a 24 y.o. yo male polysubstance abuse, actively using presented with rhabdomyolysis causing severe AKI.  #Acute kidney injury, severe: Due to rhabdomyolysis related with drug/intoxication.  He received IV fluid.  Now off of fluid.   Good urine output and creatinine level trending down to 9.15 today.  Repeat lab tomorrow morning.  If creatinine level continue to improve then he can be discharged from the hospital.  I recommend to check renal panel in a week and possibly follow with nephrologist depending on the lab results in [place].  I have discussed this with the patient.   I have called and updated his father on 5/31.  The plan for him to go to addiction program in [place].  #Volume overload: Edema improved.  Holding diuretics as he will have auto diuresis.  #Hyperkalemia: Improved now.  #Hyponatremia due to reduced free water excretion in the setting of AKI.  Expect to improve/auto diuresis with improvement in ATN.  #Nontraumatic rhabdomyolysis: CK level trending down.  No need to check CK every day.  #Polysubstance abuse including alcohol, cocaine, heroin, THC.  As per his father the plan is to go to addiction program at [place] after discharge from the hospital.  Subjective: Seen and examined at bedside.  Feels much better.  Urine output 2.3 L and renal parameters improving.  Denies fever, chills, nausea vomiting chest pain shortness of breath. Objective Vital signs in last 24 hours: Vitals:   10/13/19 1258 10/13/19 1922 10/14/19 0350 10/14/19 0812  BP: (!) 144/79 (!) 145/76 132/81 138/77  Pulse: 85 83 83 78  Resp: 14 16 14 17   Temp: 98.3 F (36.8 C) 98.3 F (36.8 C) 97.6 F (36.4 C) 98.2 F (36.8 C)  TempSrc: Oral Oral Oral Oral  SpO2: 98% 100% 98% 96%  Weight:      Height:       Weight change:   Intake/Output Summary (Last 24 hours) at 10/14/2019 1109 Last data filed at 10/14/2019  0900 Gross per 24 hour  Intake 720 ml  Output 2000 ml  Net -1280 ml       Labs: Basic Metabolic Panel: Recent Labs  Lab 10/12/19 0331 10/13/19 0449 10/14/19 0342  NA 131* 133* 134*  K 4.1 3.7 3.8  CL 97* 97* 98  CO2 22 23 23   GLUCOSE 101* 102* 107*  BUN 106* 99* 90*  CREATININE 12.79* 11.52* 9.15*  CALCIUM 8.4* 8.7* 8.7*   Liver Function Tests: Recent Labs  Lab 10/12/19 0331 10/13/19 0449 10/14/19 0342  AST 58* 40 35  ALT 121* 93* 79*  ALKPHOS 52 47 55  BILITOT 1.6* 1.7* 1.6*  PROT 5.1* 5.4* 5.4*  ALBUMIN 2.7* 2.9* 3.1*   No results for input(s): LIPASE, AMYLASE in the last 168 hours. No results for input(s): AMMONIA in the last 168 hours. CBC: Recent Labs  Lab 10/08/19 0419 10/09/19 0620 10/10/19 0953  WBC 9.1 10.4 11.5*  HGB 12.3* 12.7* 13.0  HCT 37.1* 38.3* 38.4*  MCV 77.8* 77.5* 77.1*  PLT 138* 122* 142*   Cardiac Enzymes: Recent Labs  Lab 10/08/19 0419 10/09/19 0620 10/10/19 0953 10/11/19 0332 10/12/19 0331  CKTOTAL 15,533* 7,523* 4,806* 3,309* 1,581*   CBG: No results for input(s): GLUCAP in the last 168 hours.  Iron Studies: No results for input(s): IRON, TIBC, TRANSFERRIN, FERRITIN in the last 72 hours. Studies/Results: No results found.  Medications: Infusions:   Scheduled Medications: . heparin  5,000 Units  Subcutaneous Q8H  . nicotine  14 mg Transdermal Daily  . sodium chloride flush  3 mL Intravenous Q12H    have reviewed scheduled and prn medications.  Physical Exam: General:NAD, comfortable Heart:RRR, s1s2 nl Lungs: Clear b/l, no crackle Abdomen:soft, Non-tender, non-distended Extremities: Trace LE edema Neurology: Alert awake and following commands  Kalijah Zeiss Tanna Furry 10/14/2019,11:09 AM  LOS: 11 days  Pager: [phone]

## 2019-10-15 LAB — SARS CORONAVIRUS 2 BY RT PCR (HOSPITAL ORDER, PERFORMED IN ~~LOC~~ HOSPITAL LAB): SARS Coronavirus 2: NEGATIVE

## 2019-10-15 LAB — COMPREHENSIVE METABOLIC PANEL
ALT: 69 U/L — ABNORMAL HIGH (ref 0–44)
AST: 33 U/L (ref 15–41)
Albumin: 3.1 g/dL — ABNORMAL LOW (ref 3.5–5.0)
Alkaline Phosphatase: 49 U/L (ref 38–126)
Anion gap: 13 (ref 5–15)
BUN: 77 mg/dL — ABNORMAL HIGH (ref 6–20)
CO2: 25 mmol/L (ref 22–32)
Calcium: 9.1 mg/dL (ref 8.9–10.3)
Chloride: 102 mmol/L (ref 98–111)
Creatinine, Ser: 6.93 mg/dL — ABNORMAL HIGH (ref 0.61–1.24)
GFR calc Af Amer: 12 mL/min — ABNORMAL LOW (ref >60)
GFR calc non Af Amer: 10 mL/min — ABNORMAL LOW (ref >60)
Glucose, Bld: 117 mg/dL — ABNORMAL HIGH (ref 70–99)
Potassium: 4.2 mmol/L (ref 3.5–5.1)
Sodium: 140 mmol/L (ref 135–145)
Total Bilirubin: 0.9 mg/dL (ref 0.3–1.2)
Total Protein: 5.6 g/dL — ABNORMAL LOW (ref 6.5–8.1)

## 2019-10-15 LAB — CK: Total CK: 392 U/L (ref 49–397)

## 2019-10-15 NOTE — Progress Notes (Addendum)
[facility] NEPHROLOGY PROGRESS NOTE  Assessment/ Plan: Pt is a 24 y.o. yo male polysubstance abuse, actively using presented with rhabdomyolysis causing severe AKI.  #Acute kidney injury, severe: Due to rhabdomyolysis related with drug/intoxication.  He received IV fluid.  Now off of fluid.   Nonoliguric and creatinine level continue to improve.  He is mental status has improved.  No features of uremia.  Creatinine level down to 6.9 today. Okay to discharge from renal perspective.  I recommend to check renal panel in a week with PCP and possibly follow with nephrologist depending on the lab results in [place].  I have discussed this with the patient.   I have called and updated his father on 5/31.  The plan for him to go to addiction program in [place].  #Volume overload: Edema improved.  Holding diuretics as he will have auto diuresis.  #Hyperkalemia: Improved now.  #Hyponatremia due to reduced free water excretion in the setting of AKI.  Improved.  #Nontraumatic rhabdomyolysis: CK level trending down.  No need to check CK every day.  #Polysubstance abuse including alcohol, cocaine, heroin, THC.  As per his father the plan is to go to addiction program at [place] after discharge from the hospital.  Sign off, please call back with question.  Subjective: Seen and examined at bedside.  Feeling much better.  Urine output 3.2 liters.  Denies nausea vomiting chest pain shortness of breath.  Feels good. Objective Vital signs in last 24 hours: Vitals:   10/14/19 1412 10/14/19 1942 10/15/19 0456 10/15/19 0727  BP: 130/72 (!) 154/77 (!) 144/80 (!) 148/93  Pulse: 81 91 78 81  Resp: 16 18 18 17   Temp: 98.1 F (36.7 C) 98.7 F (37.1 C) 98.7 F (37.1 C) 98 F (36.7 C)  TempSrc: Oral Oral  Oral  SpO2: 98% 100% 100% 100%  Weight:      Height:       Weight change:   Intake/Output Summary (Last 24 hours) at 10/15/2019 1012 Last data filed at 10/15/2019 0700 Gross  per 24 hour  Intake 743 ml  Output 3200 ml  Net -2457 ml       Labs: Basic Metabolic Panel: Recent Labs  Lab 10/13/19 0449 10/14/19 0342 10/15/19 0443  NA 133* 134* 140  K 3.7 3.8 4.2  CL 97* 98 102  CO2 23 23 25   GLUCOSE 102* 107* 117*  BUN 99* 90* 77*  CREATININE 11.52* 9.15* 6.93*  CALCIUM 8.7* 8.7* 9.1   Liver Function Tests: Recent Labs  Lab 10/13/19 0449 10/14/19 0342 10/15/19 0443  AST 40 35 33  ALT 93* 79* 69*  ALKPHOS 47 55 49  BILITOT 1.7* 1.6* 0.9  PROT 5.4* 5.4* 5.6*  ALBUMIN 2.9* 3.1* 3.1*   No results for input(s): LIPASE, AMYLASE in the last 168 hours. No results for input(s): AMMONIA in the last 168 hours. CBC: Recent Labs  Lab 10/09/19 0620 10/10/19 0953  WBC 10.4 11.5*  HGB 12.7* 13.0  HCT 38.3* 38.4*  MCV 77.5* 77.1*  PLT 122* 142*   Cardiac Enzymes: Recent Labs  Lab 10/09/19 0620 10/10/19 0953 10/11/19 0332 10/12/19 0331 10/15/19 0443  CKTOTAL 7,523* 4,806* 3,309* 1,581* 392   CBG: No results for input(s): GLUCAP in the last 168 hours.  Iron Studies: No results for input(s): IRON, TIBC, TRANSFERRIN, FERRITIN in the last 72 hours. Studies/Results: No results found.  Medications: Infusions:   Scheduled Medications:  heparin  5,000 Units Subcutaneous Q8H   nicotine  14 mg Transdermal Daily   sodium chloride flush  3 mL Intravenous Q12H    have reviewed scheduled and prn medications.  Physical Exam: General:NAD, comfortable Heart:RRR, s1s2 nl Lungs: Clear b/l, no crackle Abdomen:soft, Non-tender, non-distended Extremities: No LE edema Neurology: Alert awake and following commands  Kammi Hechler Jaynie Collins 10/15/2019,10:12 AM  LOS: 12 days  Pager: [phone]

## 2019-10-15 NOTE — Plan of Care (Signed)
  Problem: Education: Goal: Knowledge of General Education information will improve Description: Including pain rating scale, medication(s)/side effects and non-pharmacologic comfort measures Outcome: Progressing   Problem: Health Behavior/Discharge Planning: Goal: Ability to manage health-related needs will improve Outcome: Progressing   Problem: Clinical Measurements: Goal: Will remain free from infection Outcome: Progressing   Problem: Pain Managment: Goal: General experience of comfort will improve Outcome: Progressing   Problem: Safety: Goal: Ability to remain free from injury will improve Outcome: Progressing   

## 2019-10-15 NOTE — Discharge Summary (Signed)
Physician Discharge Summary  Alex Potter ZOX:[id number] DOB: 02/25/1996 DOA: 10/03/2019  PCP: Patient, No Pcp Per  Admit date: 10/03/2019 Discharge date: 10/15/2019  Admitted From: home Disposition:  home  Recommendations for Outpatient Follow-up:  1. Follow up with PCP in 1 weeks 2. Please obtain BMP in one week 3. Please follow up on the following pending results:  Home Health:no  Equipment/Devices: none  Discharge Condition: Stable Code Status: full Diet recommendation:  Diet Order            Diet general        Diet renal with fluid restriction Fluid restriction: 1200 mL Fluid; Room service appropriate? Yes; Fluid consistency: Thin  Diet effective now               Brief/Interim Summary: 24 y.o.malewith past medical history notable for hyperlipidemia, homeless who is followed [facility] comes into the ED for feeling bad. UDS was positive for cannabis with a white count of 25 platelet count of 255 and a CK of 15,000. He admits to cocaine use with multiple beers and that he passed out. He does not know how much time he was out for maybe 6 to 12 hours. Patient was admitted with ATN, severe rhabdomyolysis CK more than 50,000, creatinine peaked at 14, substance abuse with alcohol cocaine heroine THC, hyperkalemia. Patient was seen by nephrology. Patient was hydrated his creatinine BUN is nicely improving.  No longer fluid overloaded not needing Lasix and self diuresing. BUN/creatinine nicely improving creatinine had 9.1 today Nephrology is okay with discharge home with follow-up BMP in 1 week and follow-up with nephrology based on the renal function.  Discussed with the patient and he verbalized understanding. He has been ambulating without any issues. He has been extensively counseled about polysubstance abuse cessation. Patient and family planning to take him to [place] for rehab. I discussed with patient's mother unable to reach patient's father over the  phone.  They plan to take him directly to the [place] rehab center most likely tomorrow, requesting Covid test for placement to rehab.  Discharge Diagnoses:  Principal Problem:   Rhabdomyolysis Active Problems:   Tobacco abuse   Polysubstance abuse (HCC)   ATN (acute tubular necrosis) (HCC)   Hyperkalemia   Liver dysfunction   Leukocytosis  Nontraumatic rhabdomyolysis: Above 50,000 CK on admission.  MRI L-spine with muscular inflammation with likely necrosis right quadratus lumbar M.  Likely acute intoxication with narcotics unknown downtime.   CK has normalized  ATN due to rhabdomyolysis/drug intoxication: Creatinine peaked to 14.0 now downtrending off IV fluids, followed by nephrology, encourage oral hydration. creeat at 9.1, improving and okay for /dc home and bmp in 1 wk, nephro signed off Recent Labs  Lab 10/11/19 0332 10/12/19 0331 10/13/19 0449 10/14/19 0342 10/15/19 0443  BUN 106* 106* 99* 90* 77*  CREATININE 14.03* 12.79* 11.52* 9.15* 6.93*   Polysubstance abuse w EtOH, cocaine, heroin, THC.  UDS positive for THC and opiates on admission.  Admitted to snorting heroin.  He has been extensively counseled for need to completely quit and cessation.  He has been to rehab several times in the past per his father but typically he relapses soon after discharge social worker was consulted and father is planning to take him back to [place] for drug rehab  Hyperkalemia due to ATN- resolved  Hyponatremia due to reduced free water deficit.  Auto diuresing improving.  Transaminitis/Liver dysfunction-IMPROVING, TB STILL HIGH, MONITOR  Leukocytosis: likely reactive.  Resolved.  Volume overload edema improved, holding diuretics as he is auto diuresing.  Right upper extremity edema duplex negative for DVT likely from IV fluids.  Right ankle foot pain x-ray was unremarkable, pain control. Consults:  nephro  Subjective: No new complaints.  Resting well, alert  awake oriented.  Has been ambulating.  Discharge Exam: Vitals:   10/15/19 0456 10/15/19 0727  BP: (!) 144/80 (!) 148/93  Pulse: 78 81  Resp: 18 17  Temp: 98.7 F (37.1 C) 98 F (36.7 C)  SpO2: 100% 100%   General: Pt is alert, awake, not in acute distress Cardiovascular: RRR, S1/S2 +, no rubs, no gallops Respiratory: CTA bilaterally, no wheezing, no rhonchi Abdominal: Soft, NT, ND, bowel sounds + Extremities: no edema, no cyanosis  Discharge Instructions  Discharge Instructions    Diet general   Complete by: As directed    Discharge instructions   Complete by: As directed    Please check your BMP in 1 week from PCP. You may need to see nephrologist based upon your labs.  Please avoid any kind of illicit substance  Please call call MD or return to ER for similar or worsening recurring problem that brought you to hospital or if any fever,nausea/vomiting,abdominal pain, uncontrolled pain, chest pain,  shortness of breath or any other alarming symptoms.  Please follow-up your doctor as instructed in a week time and call the office for appointment.  Please avoid alcohol, smoking, or any other illicit substance and maintain healthy habits including taking your regular medications as prescribed.  You were cared for by a hospitalist during your hospital stay. If you have any questions about your discharge medications or the care you received while you were in the hospital after you are discharged, you can call the unit and ask to speak with the hospitalist on call if the hospitalist that took care of you is not available.  Once you are discharged, your primary care physician will handle any further medical issues. Please note that NO REFILLS for any discharge medications will be authorized once you are discharged, as it is imperative that you return to your primary care physician (or establish a relationship with a primary care physician if you do not have one) for your aftercare needs  so that they can reassess your need for medications and monitor your lab values   Increase activity slowly   Complete by: As directed      Allergies as of 10/15/2019      Reactions   Mushroom Extract Complex Itching, Swelling      Medication List    TAKE these medications   albuterol 108 (90 Base) MCG/ACT inhaler Commonly known as: VENTOLIN HFA Inhale 1-2 puffs into the lungs every 6 (six) hours as needed for wheezing or shortness of breath.   oxymetazoline 0.05 % nasal spray Commonly known as: AFRIN Place 2 sprays into both nostrils 2 (two) times daily as needed for congestion.      Follow-up Information    [facility]. Schedule an appointment as soon as possible for a visit.   Why: Please make an appointment with your primary care physician at the clinic for hospital followup. Contact information: [address], [place], [place] [address]  [phone]       PCP Follow up.   Contact information: need bmp check in 1 week and possible nephrology follow up based upon labs.         Allergies  Allergen Reactions  . Mushroom  Extract Complex Itching and Swelling    The results of significant diagnostics from this hospitalization (including imaging, microbiology, ancillary and laboratory) are listed below for reference.    Microbiology: No results found for this or any previous visit (from the past 240 hour(s)).  Procedures/Studies: DG Chest 2 View  Result Date: 10/03/2019 CLINICAL DATA:  Possible left lung opacity. EXAM: CHEST - 2 VIEW COMPARISON:  None. FINDINGS: Normal heart size and mediastinal contours. No acute infiltrate or edema. Minimal scarring or atelectasis on the left. No effusion or pneumothorax. No acute osseous findings. IMPRESSION: No active cardiopulmonary disease. Electronically Signed   By: Marnee Spring M.D.   On: 10/03/2019 09:15   DG Ankle Complete Right  Result Date: 10/08/2019 CLINICAL DATA:  Right ankle and foot  pain EXAM: RIGHT ANKLE - COMPLETE 3+ VIEW COMPARISON:  None. FINDINGS: There is no evidence of fracture, dislocation, or joint effusion. There is no evidence of arthropathy or other focal bone abnormality. Soft tissues are unremarkable. IMPRESSION: Negative. Electronically Signed   By: Jasmine Pang M.D.   On: 10/08/2019 19:43   DG Ankle Complete Right  Result Date: 10/03/2019 CLINICAL DATA:  Insensate right foot and ankle after drug use. EXAM: RIGHT ANKLE - COMPLETE 3+ VIEW COMPARISON:  None. FINDINGS: There is no evidence of fracture, dislocation, or joint effusion. There is no evidence of arthropathy or other focal bone abnormality. Soft tissues are unremarkable. IMPRESSION: Negative. Electronically Signed   By: Marnee Spring M.D.   On: 10/03/2019 09:15   MR Lumbar Spine W Wo Contrast  Result Date: 10/03/2019 CLINICAL DATA:  Acute left lower extremity numbness. Recent fall. Recent heroin use. EXAM: MRI LUMBAR SPINE WITHOUT AND WITH CONTRAST TECHNIQUE: Multiplanar and multiecho pulse sequences of the lumbar spine were obtained without and with intravenous contrast. CONTRAST:  76mL GADAVIST GADOBUTROL 1 MMOL/ML IV SOLN COMPARISON:  None. FINDINGS: Segmentation: 5 lumbar type vertebrae based on the available coverage and lowest ribs. Alignment:  Normal Vertebrae:  No fracture, evidence of discitis, or bone lesion. Conus medullaris and cauda equina: Conus extends to the L1 level. Conus and cauda equina appear normal. Paraspinal and other soft tissues: Edema and expansion of the right quadratus lumborum and deep intrinsic back muscles on the right at the level of L2 at L3 primarily. There is partially covered deep bilateral gluteus muscular edema. There are areas of superimposed poor enhancement at the right quadratus lumborum and left gluteal musculature, but the internal architecture with fascicular appearance is maintained, more suggestive of muscle necrosis than abscess. Disc levels: No degenerative  changes or discitis. IMPRESSION: 1. Muscular inflammation and probable necrosis in the right quadratus lumborum and adjacent intrinsic back muscles. Similar but partially covered changes are seen in the bilateral deep gluteal musculature. Given the history findings could relate to strain, pressure necrosis, or less likely infectious myositis. Need correlation for trauma, episode of unconsciousness, or bacteremia. 2. Concerning the distal right leg weakness, inflammation surrounds the upper lumbar plexus and may be associated with plexopathy or nonvisualized sites of muscular inflammation. Electronically Signed   By: Marnee Spring M.D.   On: 10/03/2019 11:50   US RENAL  Result Date: 10/04/2019 CLINICAL DATA:  Annual Rea EXAM: RENAL / URINARY TRACT ULTRASOUND COMPLETE COMPARISON:  None. FINDINGS: Right Kidney: Renal measurements: 10.2 x 5.6 x 5.5 cm = volume: 164.7 mL. Increased cortical echogenicity. Left Kidney: Renal measurements: 9.7 x 5.1 x 5.0 cm = volume: 128.9 mL. Increased cortical echogenicity. Bladder: The bladder wall is  thick and hyperemic. Other: None. IMPRESSION: 1. Increased cortical echogenicity bilaterally is consistent with medical renal disease. No hydronephrosis. 2. The bladder wall is thickened and hyperemic. This is nonspecific but could be due to cystitis. Recommend clinical correlation and correlation with urinalysis. Electronically Signed   By: Dorise Bullion III M.D   On: 10/04/2019 20:30   DG CHEST PORT 1 VIEW  Result Date: 10/07/2019 CLINICAL DATA:  Shortness of breath EXAM: PORTABLE CHEST 1 VIEW COMPARISON:  Oct 03, 2019 FINDINGS: Lungs are clear. Heart size and pulmonary vascularity are normal. No adenopathy. No bone lesions. IMPRESSION: No abnormality noted. Electronically Signed   By: Lowella Grip III M.D.   On: 10/07/2019 09:49   DG Shoulder Left  Result Date: 10/03/2019 CLINICAL DATA:  Pain EXAM: LEFT SHOULDER - 2+ VIEW COMPARISON:  None. FINDINGS: There is no  acute displaced fracture or dislocation. No radiopaque foreign body. There is a possible left mid lung zone airspace opacity which is suboptimally evaluated on this exam. IMPRESSION: 1. No acute displaced fracture or dislocation. No radiopaque foreign body. 2. Possible left mid lung zone airspace opacity. A dedicated chest radiograph is recommended. Electronically Signed   By: Constance Holster M.D.   On: 10/03/2019 03:34   DG Foot 2 Views Right  Result Date: 10/08/2019 CLINICAL DATA:  Ankle and foot pain EXAM: RIGHT FOOT - 2 VIEW COMPARISON:  None. FINDINGS: There is no evidence of fracture or dislocation. There is no evidence of arthropathy or other focal bone abnormality. Soft tissues are unremarkable. IMPRESSION: Negative. Electronically Signed   By: Donavan Foil M.D.   On: 10/08/2019 19:43   DG Foot Complete Right  Result Date: 10/03/2019 CLINICAL DATA:  Insensate right foot and ankle. EXAM: RIGHT FOOT COMPLETE - 3+ VIEW COMPARISON:  None. FINDINGS: There is no evidence of fracture or dislocation. There is no evidence of arthropathy or other focal bone abnormality. Soft tissues are unremarkable. IMPRESSION: Negative. Electronically Signed   By: Monte Fantasia M.D.   On: 10/03/2019 09:13   DG Hip Unilat W or Wo Pelvis 1 View Left  Result Date: 10/03/2019 CLINICAL DATA:  Pain. EXAM: DG HIP (WITH OR WITHOUT PELVIS) 1V*L* COMPARISON:  None. FINDINGS: There is no evidence of hip fracture or dislocation. There is no evidence of arthropathy or other focal bone abnormality. IMPRESSION: Negative. Electronically Signed   By: Constance Holster M.D.   On: 10/03/2019 03:33   VAS [place] UPPER EXTREMITY VENOUS DUPLEX  Result Date: 10/11/2019 UPPER VENOUS STUDY  Indications: Swelling Risk Factors: None identified. Comparison Study: No prior studies. Performing Technologist: Oliver Hum RVT  Examination Guidelines: A complete evaluation includes B-mode imaging, spectral Doppler, color Doppler, and power  Doppler as needed of all accessible portions of each vessel. Bilateral testing is considered an integral part of a complete examination. Limited examinations for reoccurring indications may be performed as noted.  Right Findings: +----------+------------+---------+-----------+----------+-------+ RIGHT     CompressiblePhasicitySpontaneousPropertiesSummary +----------+------------+---------+-----------+----------+-------+ Subclavian    Full       Yes       Yes                      +----------+------------+---------+-----------+----------+-------+  Left Findings: +----------+------------+---------+-----------+----------+-------+ LEFT      CompressiblePhasicitySpontaneousPropertiesSummary +----------+------------+---------+-----------+----------+-------+ IJV           Full       Yes       Yes                      +----------+------------+---------+-----------+----------+-------+  Subclavian    Full       Yes       Yes                      +----------+------------+---------+-----------+----------+-------+ Axillary      Full       Yes       Yes                      +----------+------------+---------+-----------+----------+-------+ Brachial      Full       Yes       Yes                      +----------+------------+---------+-----------+----------+-------+ Radial        Full                                          +----------+------------+---------+-----------+----------+-------+ Ulnar         Full                                          +----------+------------+---------+-----------+----------+-------+ Cephalic      Full                                          +----------+------------+---------+-----------+----------+-------+ Basilic       Full                                          +----------+------------+---------+-----------+----------+-------+  Summary:  Right: No evidence of thrombosis in the subclavian.  Left: No evidence of deep vein  thrombosis in the upper extremity. No evidence of superficial vein thrombosis in the upper extremity.  *See table(s) above for measurements and observations.  Diagnosing physician: Coral Else MD Electronically signed by Coral Else MD on 10/11/2019 at 1:17:38 PM.    Final     Labs: BNP (last 3 results) No results for input(s): BNP in the last 8760 hours. Basic Metabolic Panel: Recent Labs  Lab 10/11/19 0332 10/12/19 0331 10/13/19 0449 10/14/19 0342 10/15/19 0443  NA 132* 131* 133* 134* 140  K 4.7 4.1 3.7 3.8 4.2  CL 94* 97* 97* 98 102  CO2 21* 22 23 23 25   GLUCOSE 109* 101* 102* 107* 117*  BUN 106* 106* 99* 90* 77*  CREATININE 14.03* 12.79* 11.52* 9.15* 6.93*  CALCIUM 9.1 8.4* 8.7* 8.7* 9.1   Liver Function Tests: Recent Labs  Lab 10/11/19 0332 10/12/19 0331 10/13/19 0449 10/14/19 0342 10/15/19 0443  AST 100* 58* 40 35 33  ALT 162* 121* 93* 79* 69*  ALKPHOS 60 52 47 55 49  BILITOT 1.5* 1.6* 1.7* 1.6* 0.9  PROT 5.6* 5.1* 5.4* 5.4* 5.6*  ALBUMIN 3.0* 2.7* 2.9* 3.1* 3.1*   No results for input(s): LIPASE, AMYLASE in the last 168 hours. No results for input(s): AMMONIA in the last 168 hours. CBC: Recent Labs  Lab 10/09/19 0620 10/10/19 0953  WBC 10.4 11.5*  HGB 12.7* 13.0  HCT 38.3* 38.4*  MCV 77.5* 77.1*  PLT 122* 142*   Cardiac  Enzymes: Recent Labs  Lab 10/09/19 0620 10/10/19 0953 10/11/19 0332 10/12/19 0331 10/15/19 0443  CKTOTAL 7,523* 4,806* 3,309* 1,581* 392   BNP: Invalid input(s): POCBNP CBG: No results for input(s): GLUCAP in the last 168 hours. D-Dimer No results for input(s): DDIMER in the last 72 hours. Hgb A1c No results for input(s): HGBA1C in the last 72 hours. Lipid Profile No results for input(s): CHOL, HDL, LDLCALC, TRIG, CHOLHDL, LDLDIRECT in the last 72 hours. Thyroid function studies No results for input(s): TSH, T4TOTAL, T3FREE, THYROIDAB in the last 72 hours.  Invalid input(s): FREET3 Anemia work up No results for  input(s): VITAMINB12, FOLATE, FERRITIN, TIBC, IRON, RETICCTPCT in the last 72 hours. Urinalysis    Component Value Date/Time   COLORURINE AMBER (A) 10/03/2019 0732   APPEARANCEUR CLOUDY (A) 10/03/2019 0732   LABSPEC 1.016 10/03/2019 0732   PHURINE 6.0 10/03/2019 0732   GLUCOSEU 150 (A) 10/03/2019 0732   HGBUR LARGE (A) 10/03/2019 0732   BILIRUBINUR NEGATIVE 10/03/2019 0732   KETONESUR NEGATIVE 10/03/2019 0732   PROTEINUR 100 (A) 10/03/2019 0732   NITRITE NEGATIVE 10/03/2019 0732   LEUKOCYTESUR NEGATIVE 10/03/2019 0732   Sepsis Labs Invalid input(s): PROCALCITONIN,  WBC,  LACTICIDVEN Microbiology No results found for this or any previous visit (from the past 240 hour(s)).   Time coordinating discharge: 25 minutes  SIGNED: Lanae Boast, MD  [facility] 10/15/2019, 1:38 PM  If 7PM-7AM, please contact night-coverage [email or web address]

## 2019-10-15 NOTE — Plan of Care (Signed)

## 2022-03-10 IMAGING — MR MR LUMBAR SPINE WO/W CM
5 of 7 series · 30 of 48 positions shown · IV contrast (gadavist)
Comparison: None.

CLINICAL DATA: Acute left lower extremity numbness. Recent fall.
Recent heroin use.

EXAM:
MRI LUMBAR SPINE WITHOUT AND WITH CONTRAST
TECHNIQUE: Multiplanar and multiecho pulse sequences of the lumbar spine were
obtained without and with intravenous contrast.
CONTRAST:  7mL GADAVIST GADOBUTROL 1 MMOL/ML IV SOLN

[Series 9: T2 · sagittal · 4.0mm · 0.81mm/px · 3 of 15 slices shown (1 of 2)]
[im 1/15]
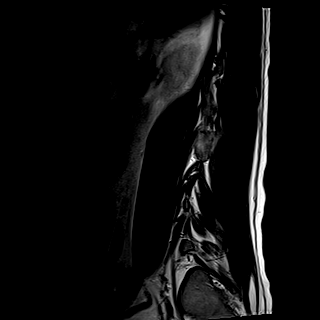
[im 8/15]
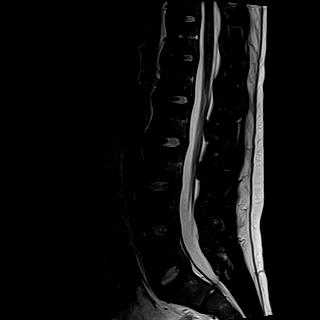
[im 15/15]
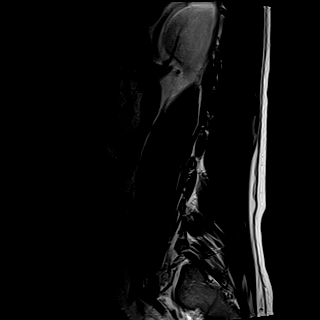

[Series 10: T1 · sagittal · 4.0mm · 0.75mm/px · 4 of 17 slices shown (1 of 2)]
[im 1/17]
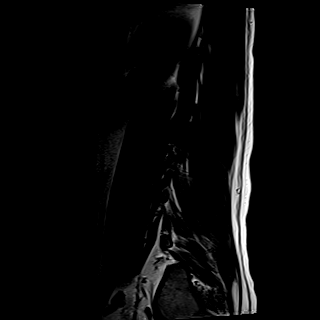
[im 6/17]
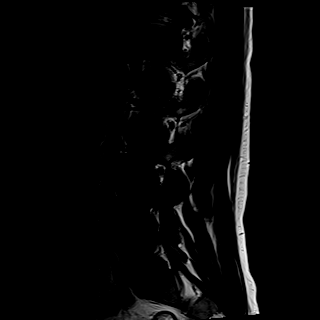
[im 11/17]
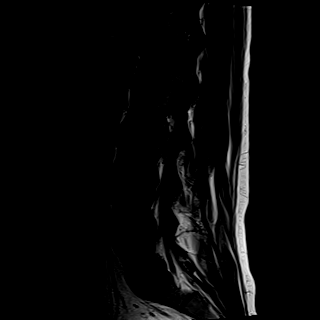
[im 17/17]
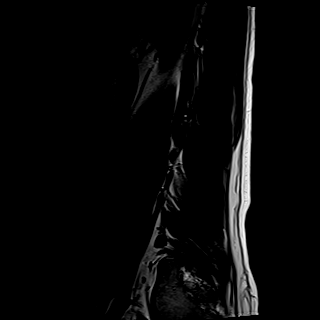

[Series 11: T2 · axial · 4.0mm · 0.62mm/px · z∈[-223,-3]mm · 11 of 41 slices shown (2 of 2)]
[im 1/41]
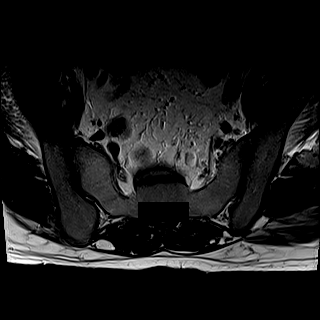
[im 5/41]
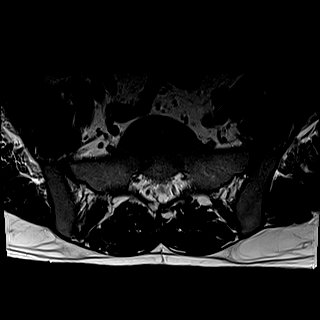
[im 9/41]
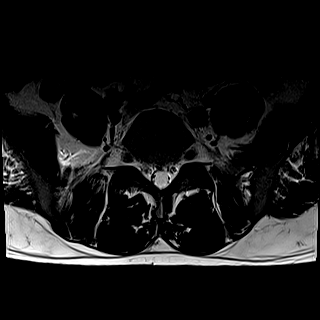
[im 13/41]
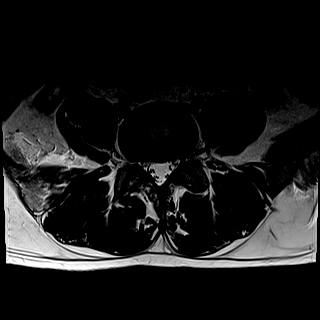
[im 17/41]
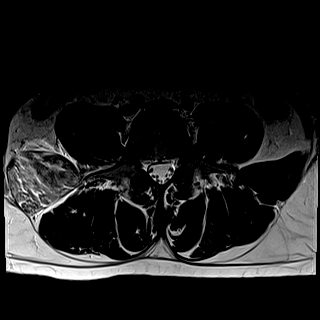
[im 21/41]
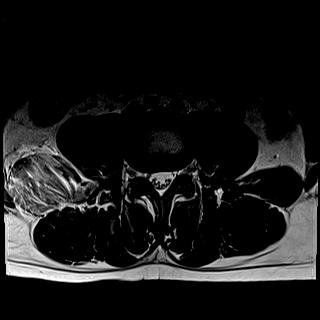
[im 25/41]
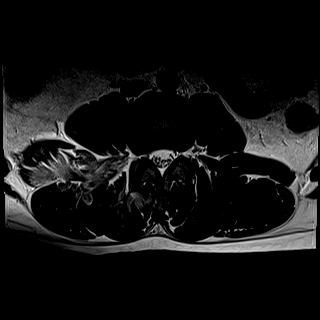
[im 29/41]
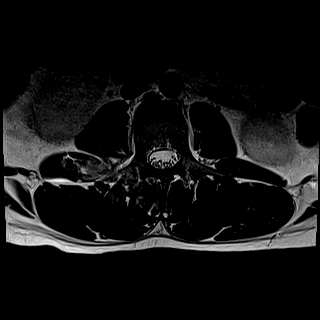
[im 33/41]
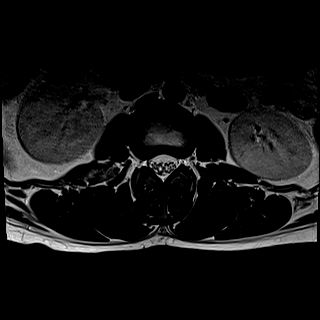
[im 37/41]
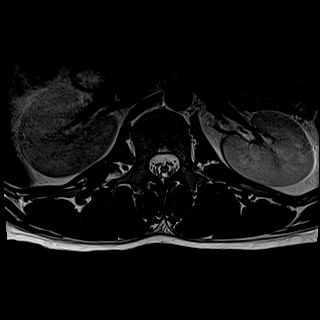
[im 41/41]
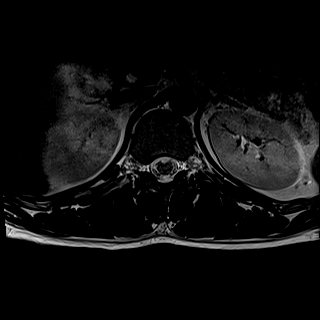

[Series 13: T1 · axial · 4.0mm · 0.43mm/px · z∈[-224,-3]mm · 11 of 41 slices shown (2 of 2)]
[im 1/41]
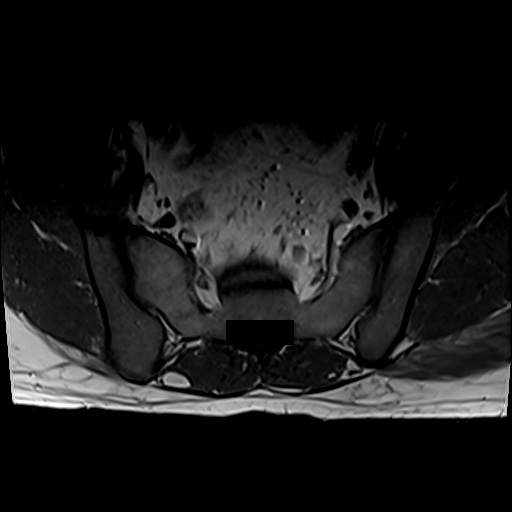
[im 5/41]
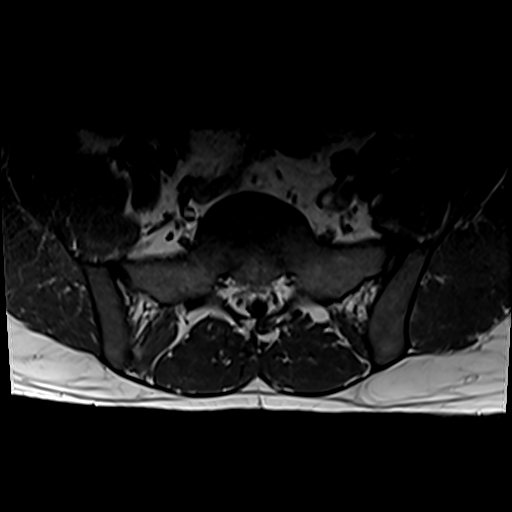
[im 9/41]
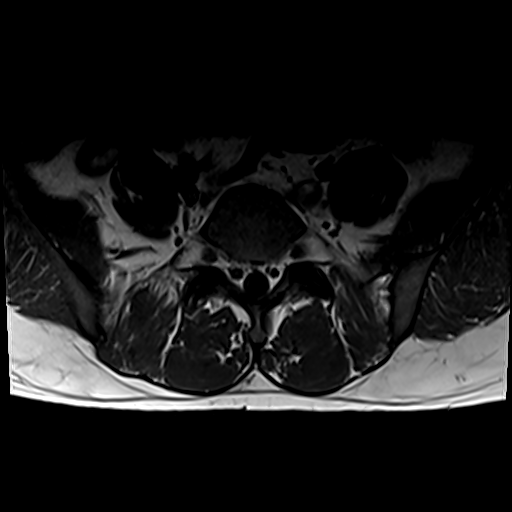
[im 13/41]
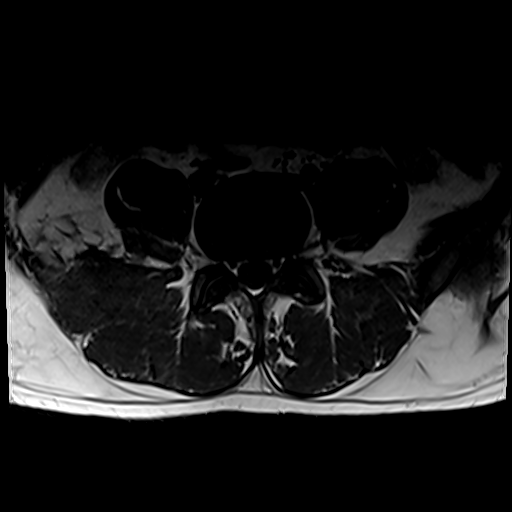
[im 17/41]
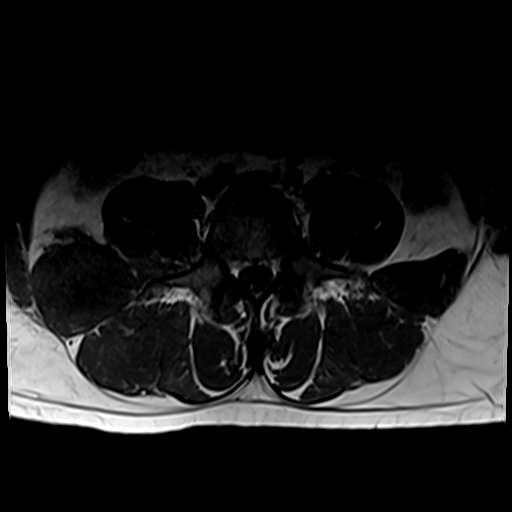
[im 21/41]
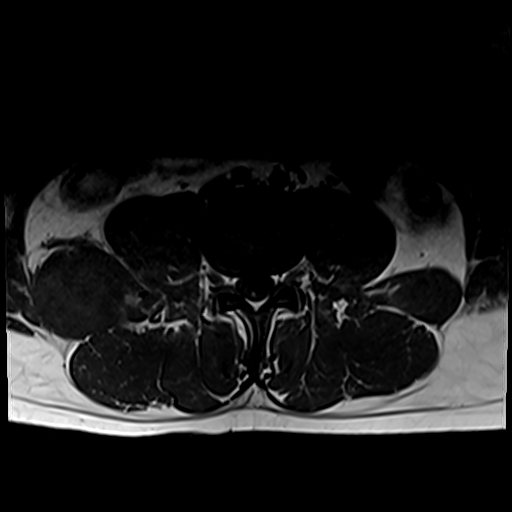
[im 25/41]
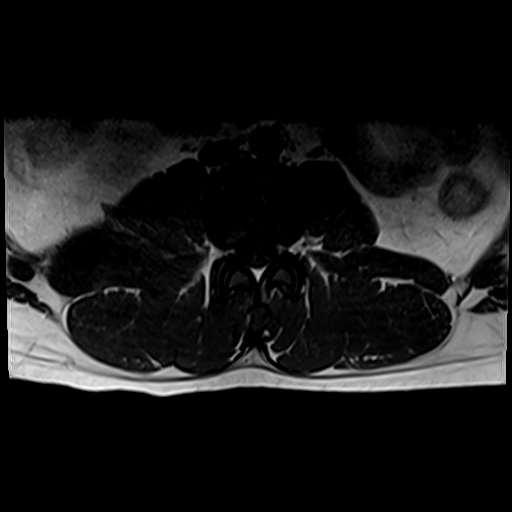
[im 29/41]
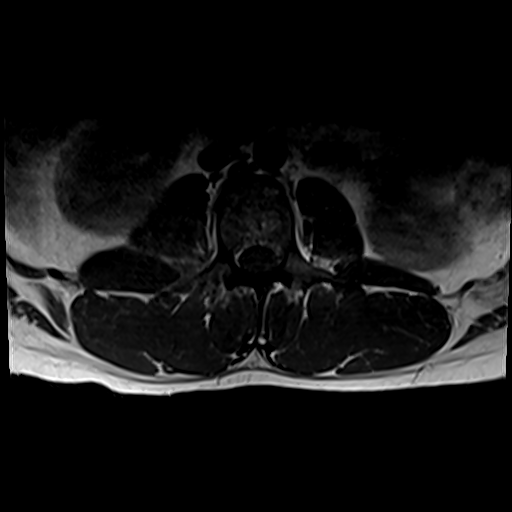
[im 33/41]
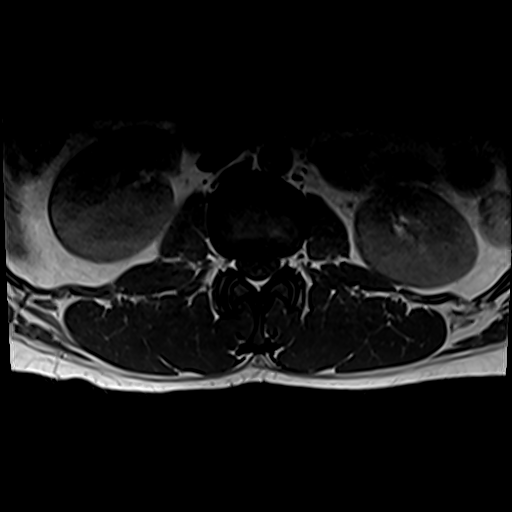
[im 37/41]
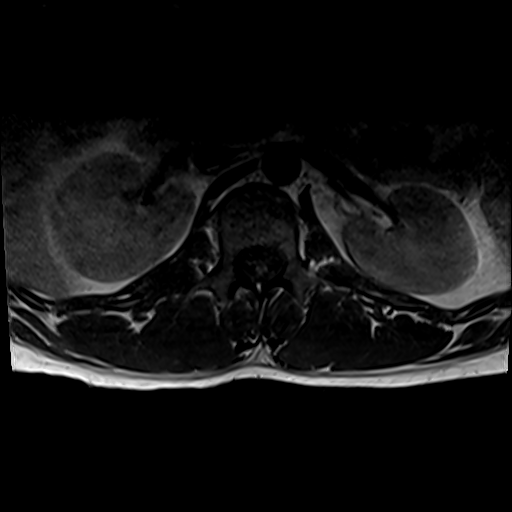
[im 41/41]
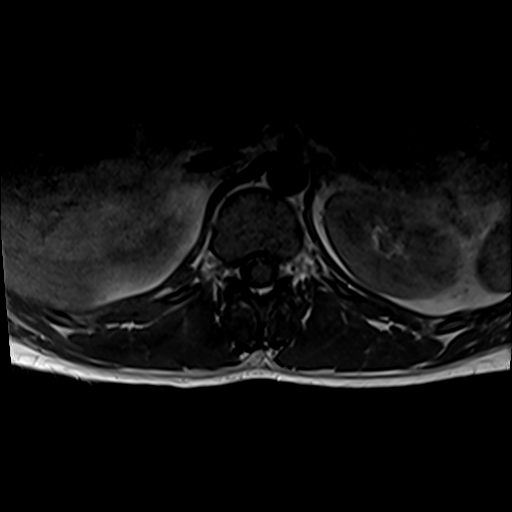

[Series 14: T1 fat-sat post-contrast · sagittal · 4.0mm · 0.75mm/px · 1 of 15 slices shown]
[im 1/15]
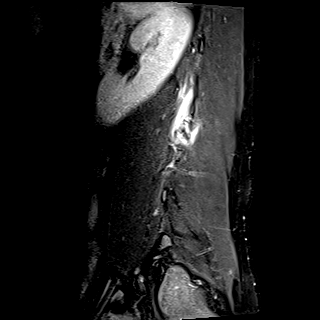

[30 of 48 positions shown; findings below may reference images not displayed]

FINDINGS: Segmentation: 5 lumbar type vertebrae based on the available
coverage and lowest ribs.

Alignment:  Normal

Vertebrae:  No fracture, evidence of discitis, or bone lesion.

Conus medullaris and cauda equina: Conus extends to the L1 level.
Conus and cauda equina appear normal.

Paraspinal and other soft tissues: Edema and expansion of the right
quadratus lumborum and deep intrinsic back muscles on the right at
the level of L2 at L3 primarily. There is partially covered deep
bilateral gluteus muscular edema. There are areas of superimposed
poor enhancement at the right quadratus lumborum and left gluteal
musculature, but the internal architecture with fascicular
appearance is maintained, more suggestive of muscle necrosis than
abscess.

Disc levels:

No degenerative changes or discitis.
IMPRESSION: 1. Muscular inflammation and probable necrosis in the right
quadratus lumborum and adjacent intrinsic back muscles. Similar but
partially covered changes are seen in the bilateral deep gluteal
musculature. Given the history findings could relate to strain,
pressure necrosis, or less likely infectious myositis. Need
correlation for trauma, episode of unconsciousness, or bacteremia.
2. Concerning the distal right leg weakness, inflammation surrounds
the upper lumbar plexus and may be associated with plexopathy or
nonvisualized sites of muscular inflammation.

## 2022-03-11 IMAGING — US US RENAL
1 series · 14 of 25 positions shown · non-contrast
Comparison: None.

CLINICAL DATA: Annual Sirie

EXAM:
RENAL / URINARY TRACT ULTRASOUND COMPLETE

[Series 1: us renal · 14 of 40 slices shown]
[im 1/40]
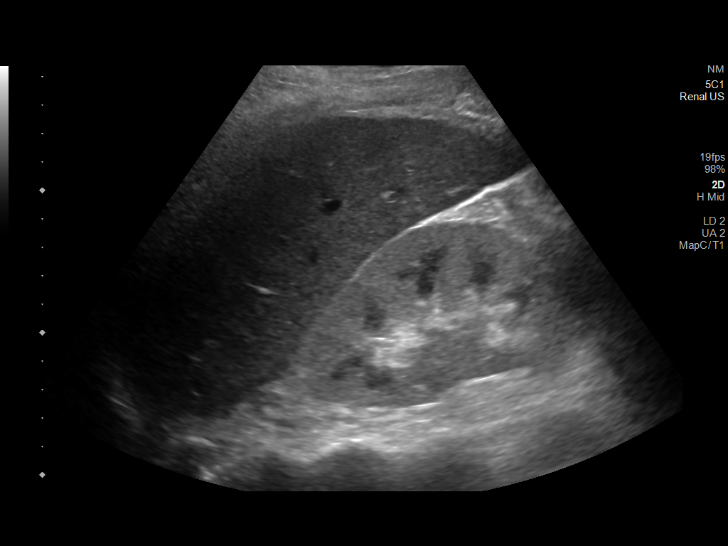
[im 4/40]
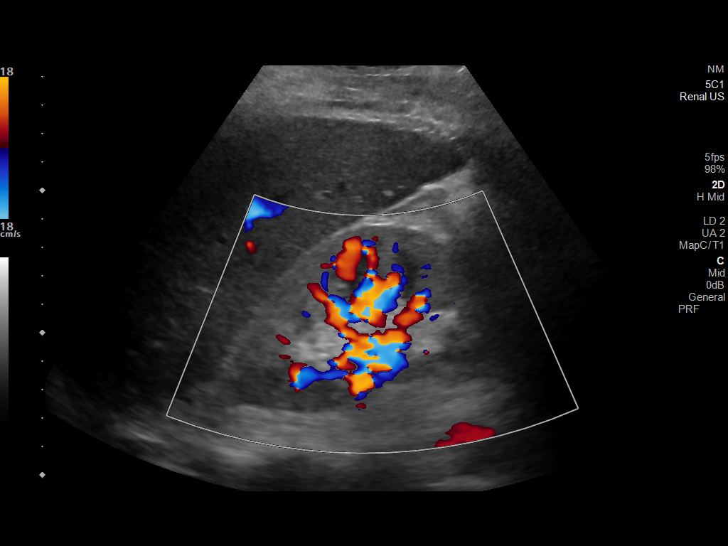
[im 7/40]
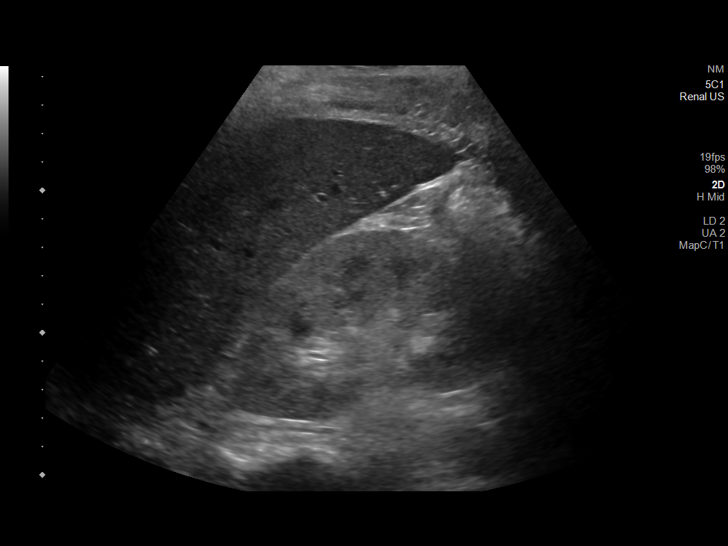
[im 10/40]
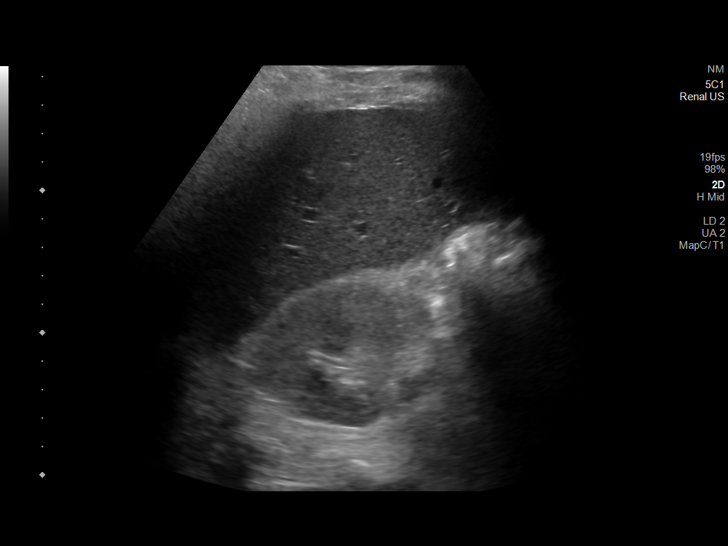
[im 14/40]
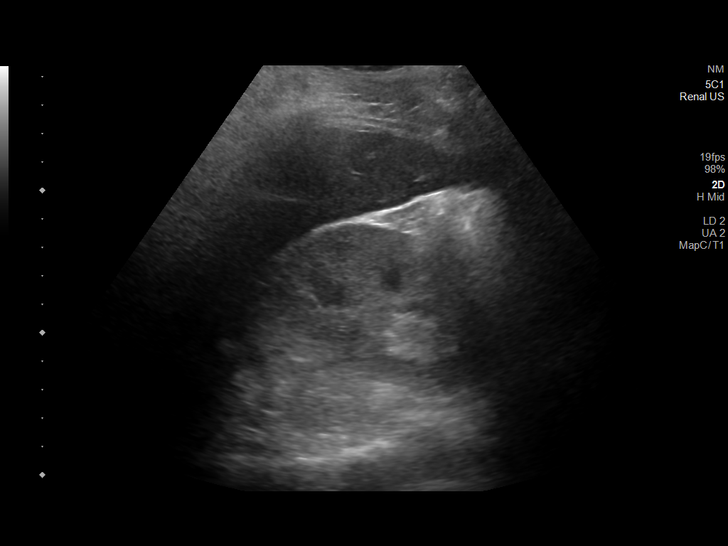
[im 15/40]
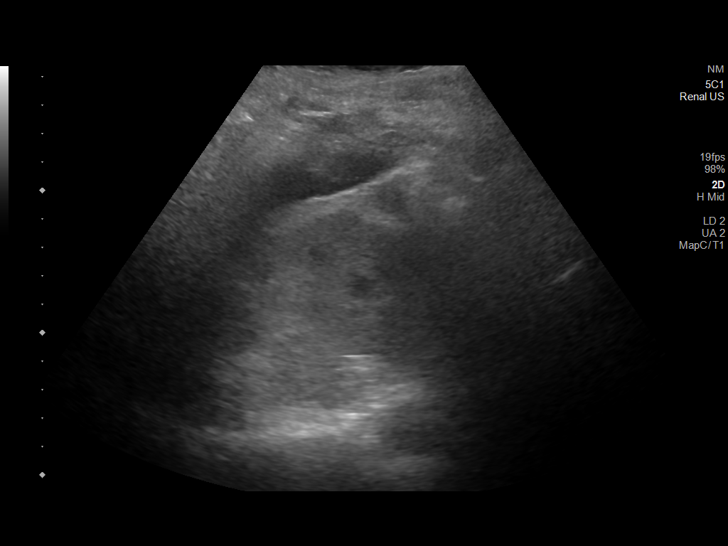
[im 18/40]
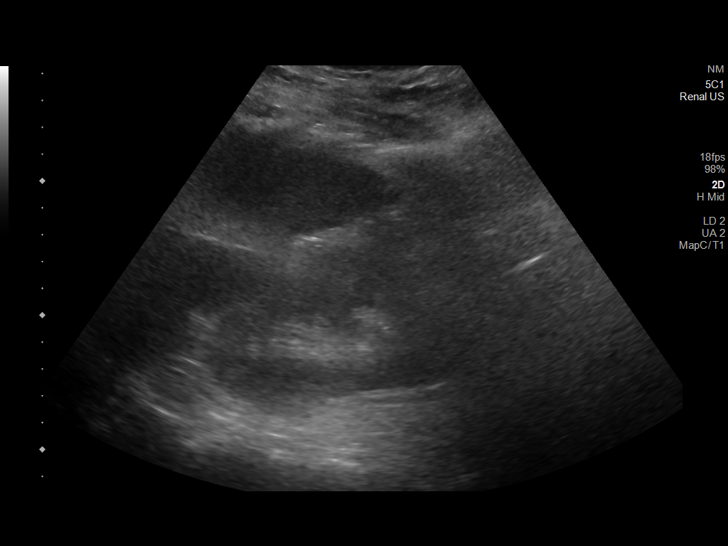
[im 22/40]
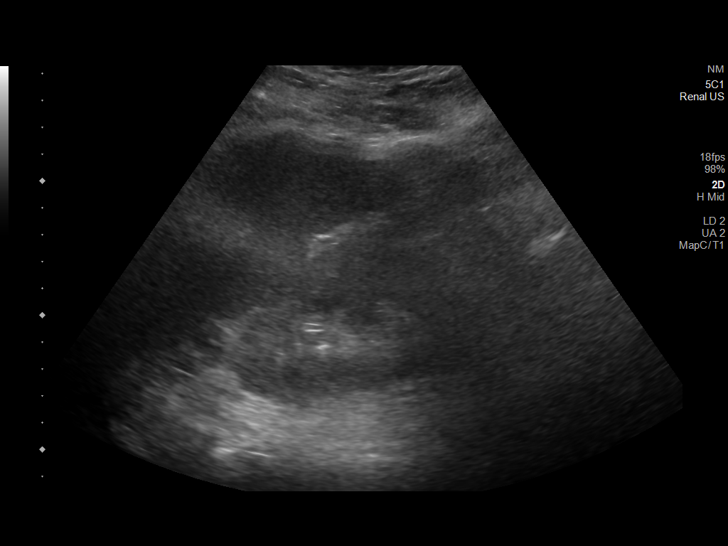
[im 25/40]
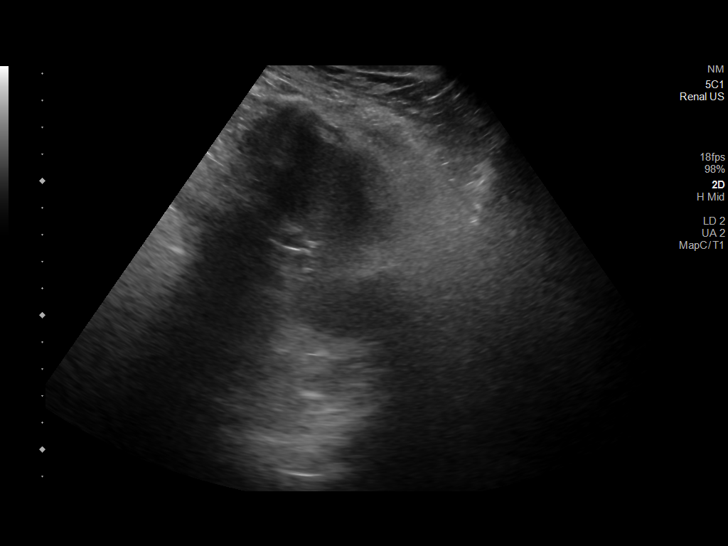
[im 27/40]
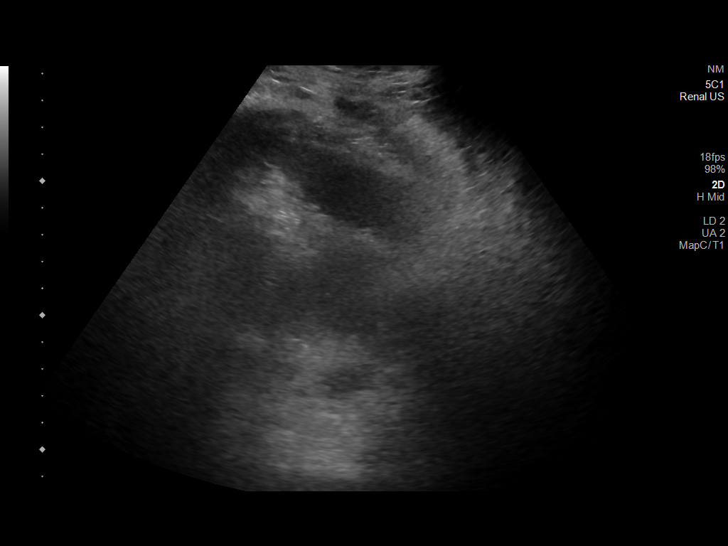
[im 30/40]
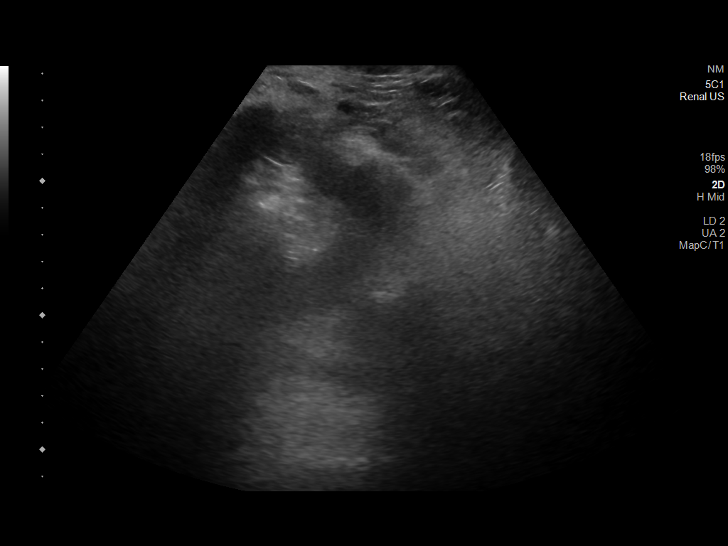
[im 33/40]
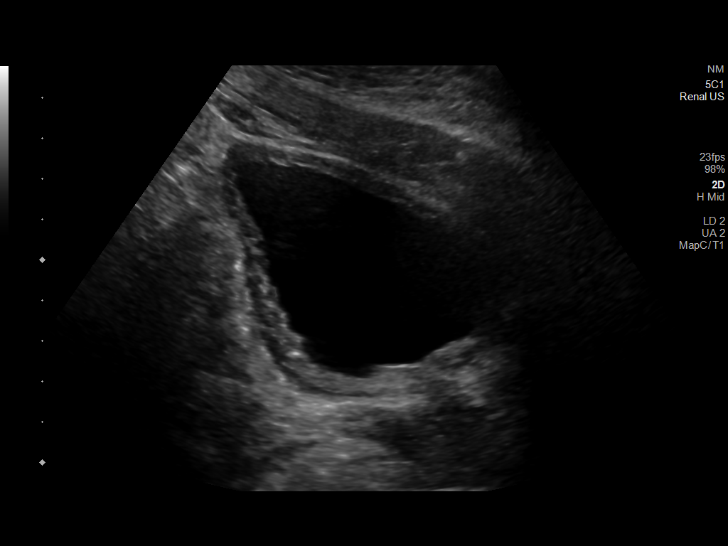
[im 36/40]
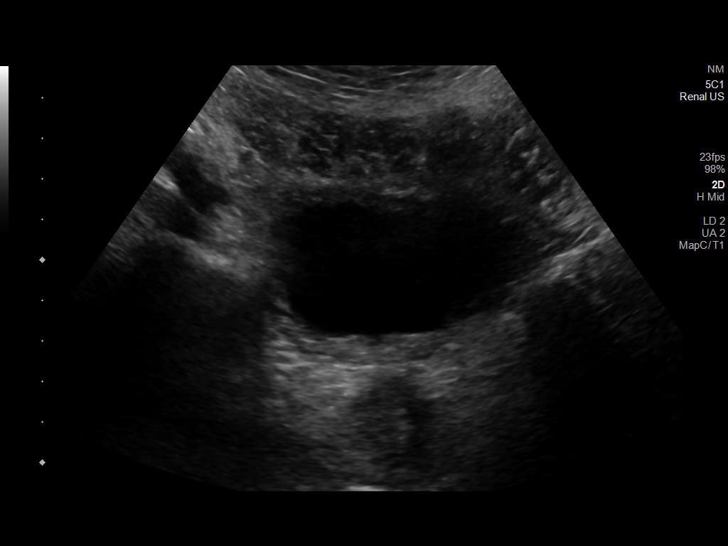
[im 40/40]
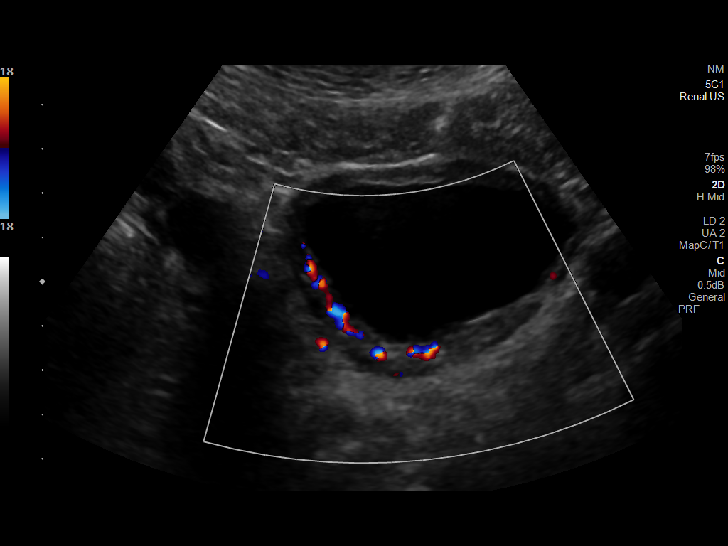

[14 of 25 positions shown; findings below may reference images not displayed]

FINDINGS: Right Kidney:

Renal measurements: 10.2 x 5.6 x 5.5 cm = volume: 164.7 mL.
Increased cortical echogenicity.

Left Kidney:

Renal measurements: 9.7 x 5.1 x 5.0 cm = volume: 128.9 mL. Increased
cortical echogenicity.

Bladder:

The bladder wall is thick and hyperemic.

Other:

None.
IMPRESSION: 1. Increased cortical echogenicity bilaterally is consistent with
medical renal disease. No hydronephrosis.
2. The bladder wall is thickened and hyperemic. This is nonspecific
but could be due to cystitis. Recommend clinical correlation and
correlation with urinalysis.
